# Patient Record
Sex: Female | Born: 1935 | Race: White | Hispanic: No | Marital: Married | Smoking: Never smoker
Health system: Southern US, Community
[De-identification: ages and names within clinical notes are randomized; demographics above are authoritative.]

## PROBLEM LIST (undated history)

## (undated) DIAGNOSIS — H3530 Unspecified macular degeneration: Secondary | ICD-10-CM

## (undated) DIAGNOSIS — C73 Malignant neoplasm of thyroid gland: Secondary | ICD-10-CM

## (undated) DIAGNOSIS — I447 Left bundle-branch block, unspecified: Secondary | ICD-10-CM

## (undated) DIAGNOSIS — A0472 Enterocolitis due to Clostridium difficile, not specified as recurrent: Secondary | ICD-10-CM

## (undated) DIAGNOSIS — I10 Essential (primary) hypertension: Secondary | ICD-10-CM

## (undated) DIAGNOSIS — I429 Cardiomyopathy, unspecified: Secondary | ICD-10-CM

## (undated) DIAGNOSIS — K922 Gastrointestinal hemorrhage, unspecified: Secondary | ICD-10-CM

## (undated) DIAGNOSIS — I4891 Unspecified atrial fibrillation: Secondary | ICD-10-CM

## (undated) DIAGNOSIS — K5792 Diverticulitis of intestine, part unspecified, without perforation or abscess without bleeding: Secondary | ICD-10-CM

## (undated) DIAGNOSIS — E039 Hypothyroidism, unspecified: Secondary | ICD-10-CM

## (undated) DIAGNOSIS — IMO0002 Reserved for concepts with insufficient information to code with codable children: Secondary | ICD-10-CM

## (undated) DIAGNOSIS — B029 Zoster without complications: Secondary | ICD-10-CM

## (undated) DIAGNOSIS — M1990 Unspecified osteoarthritis, unspecified site: Secondary | ICD-10-CM

## (undated) DIAGNOSIS — M329 Systemic lupus erythematosus, unspecified: Secondary | ICD-10-CM

## (undated) HISTORY — DX: Reserved for concepts with insufficient information to code with codable children: IMO0002

## (undated) HISTORY — PX: THYROIDECTOMY: SHX17

## (undated) HISTORY — DX: Systemic lupus erythematosus, unspecified: M32.9

## (undated) HISTORY — DX: Zoster without complications: B02.9

## (undated) HISTORY — PX: CYST REMOVAL NECK: SHX6281

---

## 2000-09-20 ENCOUNTER — Ambulatory Visit (HOSPITAL_COMMUNITY): Admission: RE | Admit: 2000-09-20 | Discharge: 2000-09-20 | Payer: Self-pay | Admitting: Internal Medicine

## 2000-09-20 ENCOUNTER — Encounter: Payer: Self-pay | Admitting: Internal Medicine

## 2000-09-26 ENCOUNTER — Ambulatory Visit (HOSPITAL_COMMUNITY): Admission: RE | Admit: 2000-09-26 | Discharge: 2000-09-26 | Payer: Self-pay | Admitting: Internal Medicine

## 2001-03-11 ENCOUNTER — Ambulatory Visit (HOSPITAL_COMMUNITY): Admission: RE | Admit: 2001-03-11 | Discharge: 2001-03-11 | Payer: Self-pay | Admitting: Internal Medicine

## 2001-03-11 ENCOUNTER — Encounter: Payer: Self-pay | Admitting: Internal Medicine

## 2002-06-18 ENCOUNTER — Encounter: Payer: Self-pay | Admitting: Internal Medicine

## 2002-06-18 ENCOUNTER — Ambulatory Visit (HOSPITAL_COMMUNITY): Admission: RE | Admit: 2002-06-18 | Discharge: 2002-06-18 | Payer: Self-pay | Admitting: Internal Medicine

## 2003-10-01 ENCOUNTER — Ambulatory Visit (HOSPITAL_COMMUNITY): Admission: RE | Admit: 2003-10-01 | Discharge: 2003-10-01 | Payer: Self-pay | Admitting: Family Medicine

## 2004-02-29 ENCOUNTER — Ambulatory Visit (HOSPITAL_COMMUNITY): Admission: RE | Admit: 2004-02-29 | Discharge: 2004-02-29 | Payer: Self-pay | Admitting: Otolaryngology

## 2004-08-04 ENCOUNTER — Encounter (INDEPENDENT_AMBULATORY_CARE_PROVIDER_SITE_OTHER): Payer: Self-pay | Admitting: *Deleted

## 2004-08-04 ENCOUNTER — Ambulatory Visit (HOSPITAL_COMMUNITY): Admission: RE | Admit: 2004-08-04 | Discharge: 2004-08-05 | Payer: Self-pay | Admitting: Otolaryngology

## 2005-06-08 ENCOUNTER — Ambulatory Visit (HOSPITAL_COMMUNITY): Admission: RE | Admit: 2005-06-08 | Discharge: 2005-06-08 | Payer: Self-pay | Admitting: Family Medicine

## 2006-08-15 ENCOUNTER — Ambulatory Visit (HOSPITAL_COMMUNITY): Admission: RE | Admit: 2006-08-15 | Discharge: 2006-08-15 | Payer: Self-pay | Admitting: Family Medicine

## 2007-01-10 ENCOUNTER — Ambulatory Visit (HOSPITAL_COMMUNITY): Admission: RE | Admit: 2007-01-10 | Discharge: 2007-01-10 | Payer: Self-pay | Admitting: Family Medicine

## 2007-08-22 ENCOUNTER — Ambulatory Visit (HOSPITAL_COMMUNITY): Admission: RE | Admit: 2007-08-22 | Discharge: 2007-08-22 | Payer: Self-pay | Admitting: Family Medicine

## 2007-11-25 ENCOUNTER — Ambulatory Visit (HOSPITAL_COMMUNITY): Admission: RE | Admit: 2007-11-25 | Discharge: 2007-11-25 | Payer: Self-pay | Admitting: Ophthalmology

## 2008-08-31 ENCOUNTER — Ambulatory Visit (HOSPITAL_COMMUNITY): Admission: RE | Admit: 2008-08-31 | Discharge: 2008-08-31 | Payer: Self-pay | Admitting: Family Medicine

## 2008-09-17 ENCOUNTER — Ambulatory Visit (HOSPITAL_COMMUNITY): Admission: RE | Admit: 2008-09-17 | Discharge: 2008-09-17 | Payer: Self-pay | Admitting: Family Medicine

## 2009-09-27 ENCOUNTER — Ambulatory Visit (HOSPITAL_COMMUNITY): Admission: RE | Admit: 2009-09-27 | Discharge: 2009-09-27 | Payer: Self-pay | Admitting: Family Medicine

## 2009-11-11 ENCOUNTER — Ambulatory Visit (HOSPITAL_COMMUNITY)
Admission: RE | Admit: 2009-11-11 | Discharge: 2009-11-11 | Payer: Self-pay | Source: Home / Self Care | Admitting: Family Medicine

## 2010-05-28 ENCOUNTER — Encounter: Payer: Self-pay | Admitting: Otolaryngology

## 2010-09-16 ENCOUNTER — Other Ambulatory Visit (HOSPITAL_COMMUNITY): Payer: Self-pay | Admitting: Family Medicine

## 2010-09-16 DIAGNOSIS — Z139 Encounter for screening, unspecified: Secondary | ICD-10-CM

## 2010-09-23 NOTE — Op Note (Signed)
NAMEBERENISE, HUNTON NO.:  [id number]   MEDICAL RECORD NO.:  [id number]          PATIENT TYPE:  OIB   LOCATION:  2550                         FACILITY:  [facility]   PHYSICIAN:  Zola Button T. Lazarus Salines, M.D. DATE OF BIRTH:  [birth year]   DATE OF PROCEDURE:  08/04/2004  DATE OF DISCHARGE:                                 OPERATIVE REPORT   PREOPERATIVE DIAGNOSIS:  Left retropharyngeal cyst.   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   PROCEDURE PERFORMED:  Excision, left retropharyngeal cyst.   SURGEON:  Karol T. Lazarus Salines, M.D.   ASSISTANT:  Tia Alert, M.D.   ANESTHESIA:  General orotracheal.   BLOOD LOSS:  Minimal.   COMPLICATIONS:  None.   FINDINGS:  A roughly 2 x 1 cm fibrotically-encapsulated cyst lying between  the muscle fibers of the longus colli muscle and down to the C1-2 joint  space with a thick, gelatinous, clear fluid content.   PROCEDURE:  With the patient in a comfortable supine position, general  orotracheal anesthesia was induced without difficulty.  At an appropriate  level, the table was turned 90 degrees and the patient placed in  Trendelenburg.  A clean preparation and draping was accomplished.  Taking  care to protect lips, teeth, and endotracheal tube, the Crowe-Davis mouth  gag was introduced, expanded for visualization, and suspended from the [facility]  stand in the standard fashion.  The findings were as described above.  A red  rubber catheter was passed through the left nostril and out the mouth to  serve as a Producer, television/film/video.  The lesion was palpated with the findings as  described above.  Xylocaine 1% with 1:100,000 epinephrine, 8 mL total, was  infiltrated in the mucosa overlying the lesion and circumferentially around  the lesion in the surgical field.  Several minutes were allowed for this to  take effect.   The lesion was palpated once again and although the mucosa was freely  movable over the  lesion, the lesion itself did not feel like it moved very  much.  The CT scans and MRI scans were reviewed.   A 3-4 cm vertical incision was made overlying the mass and carried through  the mucosa and the pharyngeal constrictors, and then with blunt division,  through the fibers of the longus colli muscle.  The muscle was retracted to  either side of the lesion, which was dissected around its capsule.  Upon  approaching the vertebral column, the dissection was carried right down to  the bone to achieve an adequate plane.  Several tiny violations oozed small  quantities of a crystal clear gelatinous fluid.  At this point, given some  suspicion of a spine-related process, assistance was requested and Dr. Marikay Alar, neurosurgeon, came to observe and assist.  He observed the x-rays and  the operative findings.  He was not familiar with a synovial cyst in this  location but agreed that it had the characteristics.  He also felt that  excision was the proper course, given the patient's symptoms, and that  entering the joint space was not  necessarily an issue either as regards loss  of the bone support, joint lubrication, or infection.   With Dr. Yetta Barre' assistance, the lesion was further dissected from the longus  colli muscle and then from the bone in the face of the vertebral bodies and  delivered.  It was sent for frozen section.  A clear view into a joint space  which was felt to be C1-2 was noted.  There was no further fluid and no  signs of infection.  No obvious pathology in the joint space.  The wound was  thoroughly irrigated with approximately 500 mL of saline.   The wound was closed in two layers.  First, the longus coli muscle was  reapproximated with 4-0 chromic and then mucosa was reapproximated with the  same material.  The frozen section returned from pathology as a benign cyst  with an attenuated lining.  When I explained the circumstances, the  pathologist did feel this was  consistent with a synovial cyst. Permanent  interpretation is pending.   The mouth gag and palate retractors were relaxed for several minutes.  Upon  re-expansion, hemostasis was persistent and the wound was flat and intact.  At this point, the procedure was completed.  The mouth gag was relaxed and  removed, as were the palate retractors.  The dental status was intact.  The  patient was returned to Anesthesia, awakened, extubated, and transferred to  recovery in stable condition.   COMMENT:  A 77 year old white female with a roughly six-month history of a  symptomatic bulge in the left lateral oropharynx/retropharyngeal lesion with  CT and MRI scan suggesting a retropharyngeal cyst was the indication for  today's procedure.  Intraoperative findings suggested a synovial cyst.  Anticipate a routine postoperative recovery with attention to analgesia and  antibiosis.  We will advance diet as comfortable.  Will observe overnight  and then discharge her home in the morning.      KTW/MEDQ  D:  08/04/2004  T:  08/04/2004  Job:  [id number]   cc:   Melvyn Novas, MD  [address]  [place]  [place] [address]  Fax: [phone]   Tia Alert, MD  [address]  [place], [place] [address]  Fax: [phone]

## 2010-10-04 ENCOUNTER — Ambulatory Visit (HOSPITAL_COMMUNITY)
Admission: RE | Admit: 2010-10-04 | Discharge: 2010-10-04 | Disposition: A | Discharge status: Home / Self Care | Payer: Medicare Other | Source: Ambulatory Visit | Attending: Family Medicine | Admitting: Family Medicine

## 2010-10-04 DIAGNOSIS — Z1231 Encounter for screening mammogram for malignant neoplasm of breast: Secondary | ICD-10-CM | POA: Insufficient documentation

## 2010-10-04 DIAGNOSIS — Z139 Encounter for screening, unspecified: Secondary | ICD-10-CM

## 2011-02-02 LAB — BASIC METABOLIC PANEL
BUN: 13
Calcium: 9
Chloride: 105
Glucose, Bld: 88
Potassium: 4.1
Sodium: 140

## 2011-03-16 ENCOUNTER — Encounter: Payer: Self-pay | Admitting: Emergency Medicine

## 2011-03-16 ENCOUNTER — Inpatient Hospital Stay (HOSPITAL_COMMUNITY)
Admission: EM | Admit: 2011-03-16 | Discharge: 2011-03-18 | DRG: 379 | Disposition: A | Discharge status: Home / Self Care | Payer: Medicare Other | Attending: Family Medicine | Admitting: Family Medicine

## 2011-03-16 ENCOUNTER — Other Ambulatory Visit: Payer: Self-pay

## 2011-03-16 DIAGNOSIS — I10 Essential (primary) hypertension: Secondary | ICD-10-CM | POA: Diagnosis present

## 2011-03-16 DIAGNOSIS — M1990 Unspecified osteoarthritis, unspecified site: Secondary | ICD-10-CM | POA: Diagnosis present

## 2011-03-16 DIAGNOSIS — Z791 Long term (current) use of non-steroidal anti-inflammatories (NSAID): Secondary | ICD-10-CM

## 2011-03-16 DIAGNOSIS — K5733 Diverticulitis of large intestine without perforation or abscess with bleeding: Principal | ICD-10-CM | POA: Diagnosis present

## 2011-03-16 DIAGNOSIS — K922 Gastrointestinal hemorrhage, unspecified: Secondary | ICD-10-CM

## 2011-03-16 DIAGNOSIS — F411 Generalized anxiety disorder: Secondary | ICD-10-CM | POA: Diagnosis present

## 2011-03-16 DIAGNOSIS — E039 Hypothyroidism, unspecified: Secondary | ICD-10-CM | POA: Diagnosis present

## 2011-03-16 HISTORY — DX: Malignant neoplasm of thyroid gland: C73

## 2011-03-16 HISTORY — DX: Unspecified macular degeneration: H35.30

## 2011-03-16 HISTORY — DX: Unspecified osteoarthritis, unspecified site: M19.90

## 2011-03-16 LAB — MAGNESIUM: Magnesium: 2 mg/dL (ref 1.5–2.5)

## 2011-03-16 LAB — DIFFERENTIAL
Basophils Absolute: 0 10*3/uL (ref 0.0–0.1)
Basophils Relative: 0 % (ref 0–1)
Lymphs Abs: 1.7 10*3/uL (ref 0.7–4.0)
Neutro Abs: 8 10*3/uL — ABNORMAL HIGH (ref 1.7–7.7)

## 2011-03-16 LAB — COMPREHENSIVE METABOLIC PANEL
Albumin: 3.3 g/dL — ABNORMAL LOW (ref 3.5–5.2)
Alkaline Phosphatase: 113 U/L (ref 39–117)
CO2: 28 mEq/L (ref 19–32)
Calcium: 8.4 mg/dL (ref 8.4–10.5)
Creatinine, Ser: 0.85 mg/dL (ref 0.50–1.10)
GFR calc non Af Amer: 64 mL/min — ABNORMAL LOW (ref >90)
Potassium: 3.6 mEq/L (ref 3.5–5.1)
Total Bilirubin: 0.5 mg/dL (ref 0.3–1.2)
Total Protein: 6.6 g/dL (ref 6.0–8.3)

## 2011-03-16 LAB — HEMOGLOBIN AND HEMATOCRIT, BLOOD
HCT: 36.2 % (ref 36.0–46.0)
Hemoglobin: 12.2 g/dL (ref 12.0–15.0)

## 2011-03-16 LAB — CBC
MCH: 29.1 pg (ref 26.0–34.0)
MCHC: 32.8 g/dL (ref 30.0–36.0)
MCV: 88.8 fL (ref 78.0–100.0)
Platelets: 318 10*3/uL (ref 150–400)
RBC: 4.54 MIL/uL (ref 3.87–5.11)
RDW: 14.1 % (ref 11.5–15.5)

## 2011-03-16 LAB — PROTIME-INR: Prothrombin Time: 13.1 seconds (ref 11.6–15.2)

## 2011-03-16 MED ORDER — PANTOPRAZOLE SODIUM 40 MG IV SOLR
40.0000 mg | INTRAVENOUS | Status: DC
  Administered 2011-03-16: 40 mg via INTRAVENOUS
  Filled 2011-03-16: qty 40

## 2011-03-16 MED ORDER — PEG 3350-KCL-NABCB-NACL-NASULF 236 G PO SOLR
4000.0000 mL | Freq: Once | ORAL | Status: AC
  Administered 2011-03-17: 4000 mL via ORAL
  Filled 2011-03-16: qty 4000

## 2011-03-16 MED ORDER — LEVOTHYROXINE SODIUM 100 MCG PO TABS
100.0000 ug | ORAL_TABLET | Freq: Every day | ORAL | Status: DC
  Administered 2011-03-17 – 2011-03-18 (×2): 100 ug via ORAL
  Filled 2011-03-16 (×2): qty 1

## 2011-03-16 MED ORDER — AMLODIPINE BESYLATE 5 MG PO TABS
5.0000 mg | ORAL_TABLET | Freq: Every day | ORAL | Status: DC
  Administered 2011-03-17 – 2011-03-18 (×2): 5 mg via ORAL
  Filled 2011-03-16 (×2): qty 1

## 2011-03-16 MED ORDER — SODIUM CHLORIDE 0.9 % IV SOLN
INTRAVENOUS | Status: DC
  Administered 2011-03-16: 13:00:00 via INTRAVENOUS

## 2011-03-16 MED ORDER — PANTOPRAZOLE SODIUM 40 MG IV SOLR: 40.0000 mg | INTRAVENOUS | Status: DC

## 2011-03-16 MED ORDER — SODIUM CHLORIDE 0.9 % IJ SOLN
INTRAMUSCULAR | Status: AC
  Filled 2011-03-16: qty 3

## 2011-03-16 MED ORDER — SODIUM CHLORIDE 0.9 % IV SOLN
Freq: Once | INTRAVENOUS | Status: AC
  Administered 2011-03-16: 09:00:00 via INTRAVENOUS

## 2011-03-16 MED ORDER — LORAZEPAM 0.5 MG PO TABS
0.5000 mg | ORAL_TABLET | Freq: Four times a day (QID) | ORAL | Status: DC | PRN
  Administered 2011-03-18: 0.5 mg via ORAL
  Filled 2011-03-16: qty 1

## 2011-03-16 MED ORDER — SODIUM CHLORIDE 0.9 % IV BOLUS (SEPSIS)
500.0000 mL | Freq: Once | INTRAVENOUS | Status: AC
  Administered 2011-03-16: 500 mL via INTRAVENOUS

## 2011-03-16 MED ORDER — HYDROCODONE-ACETAMINOPHEN 5-325 MG PO TABS
1.0000 | ORAL_TABLET | ORAL | Status: DC | PRN
  Administered 2011-03-16: 1 via ORAL
  Filled 2011-03-16: qty 1

## 2011-03-16 MED ORDER — INFLUENZA VIRUS VACC SPLIT PF IM SUSP
0.5000 mL | INTRAMUSCULAR | Status: AC
  Administered 2011-03-18: 0.5 mL via INTRAMUSCULAR
  Filled 2011-03-16: qty 0.5

## 2011-03-16 MED ORDER — SODIUM CHLORIDE 0.9 % IV SOLN
INTRAVENOUS | Status: DC
  Administered 2011-03-17 – 2011-03-18 (×2): via INTRAVENOUS

## 2011-03-16 NOTE — H&P (Signed)
NAME:  Danielle [name], Danielle [name] NO.:  [id number]  MEDICAL RECORD NO.:  [id number]  LOCATION:  A321                          FACILITY:  [facility]  PHYSICIAN:  Melvyn Novas, MDDATE OF BIRTH:  [birth year]  DATE OF ADMISSION:  03/16/2011 DATE OF DISCHARGE:  LH                             HISTORY & PHYSICAL   The patient is a 77 year old white female with a history of hypothyroidism and hypertension who had bowel movement today and noticed some bright red blood per rectum.  She denied any rectal pain upon defecation.  There was no dizziness.  No palpitations.  No syncope.  No anginal chest pain surrounding this event.  She reported to the ER hemoglobin was 13 and was subsequently admitted and presumably will be scoped for presumed diverticular bleed.  The patient had not opted to have a colonoscopy recommended to her in the past several years.  PAST MEDICAL HISTORY:  Significant for hypertension, hypothyroidism due to resection of thyroid nodules, history of palpitations and anxiety.  PAST SURGICAL HISTORY:  Remarkable for a thyroidectomy, 2 nodules in 1976.  She had a negative upper GI series.  PAST MEDICAL HISTORY:  Uterine fibroid was noted on her upper GI series.  She has no known allergies.  CURRENT MEDICINES: 1. Synthroid 100 mcg p.o. 5 days a week. 2. Metoprolol 12.5 mg p.o. daily. 3. Aspirin 81 mg per day. 4. Lodine 200 mg p.o. daily p.r.n. 5. Norvasc 5 mg p.o. daily. 6. Flaxseed oil 1 p.o. daily. 7. Nasonex nasal spray for allergic rhinitis.  She lives with her husband, does not smoke, is active and performs all house work.  She does not imbibe alcohol.  REVIEW OF SYSTEMS:  Negative for seizures, tremors, polyuria, polydipsia, weight loss, nausea vomiting, melena, hematemesis, angina, palpitations, dizziness, or syncope.  PHYSICAL EXAMINATION:  VITAL SIGNS:  Blood pressure is 117/75, temperature 98.3, pulse is 61 and regular, respiratory rate is 18,  O2 sat is 98%. HEAD:  Normocephalic, atraumatic. EYES:  PERRLA.  Extraocular movements intact.  Sclerae clear. Conjunctivae pink. LUNGS:  No rales, wheeze, or rhonchi appreciable.  No dullness to percussion. HEART:  Regular rhythm.  No murmurs, gallops, heaves, thrills, or rubs. ABDOMEN:  Soft, nontender.  Bowel sounds are normoactive.  No guarding or rebound.  No mass or megaly. EXTREMITIES:  No clubbing, cyanosis, or edema.  Peripheral pulses 1+ and intact bilaterally, and the patient alert and oriented.  Cranial nerves II through XII grossly intact.  The patient moves all 4 extremities. Plantars are downgoing.  White cells are 10.9, hemoglobin 13, platelets are 318,000.  Renal function normal.  Impression is as follows: 1. Presumed lower gastrointestinal bleed, presumed diverticular. 2. Hypertension. 3. Anxiety. 4. Hypothyroidism, currently on replacement therapy due to two thyroid     nodule resections. 5. A negative upper gastrointestinal series within the last 3 years.  The plan right now is to get serial hemoglobin and hematocrits, clear liquid diet, GoLYTELY.  Dr. Karilyn Cota will perform endoscopies in a.m.  She is hemodynamically stable.     Melvyn Novas, MD     RMD/MEDQ  D:  03/16/2011  T:  03/16/2011  Job:  [id number]

## 2011-03-16 NOTE — ED Notes (Signed)
T Setzer PA gastro in to assess pt. No changes

## 2011-03-16 NOTE — ED Notes (Signed)
PT ambulated to the bathroom.  Stool sample obtained.(bloody witah clots).  Nurse informed.  PT c/o headache once back in bed.  Tech in now obtaining orthostatics.

## 2011-03-16 NOTE — ED Notes (Signed)
Raynelle Bring, PA here for Dr. Karilyn Cota (GI) to consult.

## 2011-03-16 NOTE — Consult Note (Signed)
Reason for Consult: GI bleed Referring Physician: Wilber [name] is an 77 y.o. female.  HPI: Danielle [name] is a 77 yr old female admitted with probably lower GI bleed. She tells me she began having diarrhea with maroon color blood. She describes as a significant amount.  She has also had 4 loose bloody stools today. She denies prior history of rectal bleeding. She has never undergone a colonoscopy in the past. She has been on Lodine one a day.  She stopped Celebrex for her arthritis about two weeks ago. She does take an 81mg  ASA daily.  Her appetite has remained good. No weight loss. Slight epigastric discomfort. No abdominal pain.  She usually has one BM a day.  No family hx of colon cancer. No recent antibiotics.   Past Medical History  Diagnosis Date  . Osteoarthritis   . Macular degeneration   . Hypertension   . Thyroid disease   . Thyroid cancer     Past Surgical History  Procedure Date  . Thyroidectomy     History reviewed. No pertinent family history.  Social History:  reports that she has never smoked. She does not have any smokeless tobacco history on file. She reports that she does not drink alcohol or use illicit drugs.  Allergies: No known allergies  Medications: I have reviewed the patient's current medications. Current Facility-Administered Medications  Medication Dose Route Frequency Provider Last Rate Last Dose  . 0.9 %  sodium chloride infusion   Intravenous Once [facility]. Colon Branch, MD 75 mL/hr at 03/16/11 0847    . sodium chloride 0.9 % bolus 500 mL  500 mL Intravenous Once [employer]. Colon Branch, MD   500 mL at 03/16/11 0900   No current outpatient prescriptions on file.   Current facility-administered medications:0.9 %  sodium chloride infusion, , Intravenous, Once, [facility]. Colon Branch, MD, Last Rate: 75 mL/hr at 03/16/11 0847;  sodium chloride 0.9 % bolus 500 mL, 500 mL, Intravenous, Once, [facility]. Colon Branch, MD, 500 mL at 03/16/11 0900 No current outpatient  prescriptions on file.   Results for orders placed during the hospital encounter of 03/16/11 (from the past 48 hour(s))  CBC     Status: Abnormal   Collection Time   03/16/11  8:12 AM      Component Value Range Comment   WBC 10.9 (*) 4.0 - 10.5 (K/uL)    RBC 4.54  3.87 - 5.11 (MIL/uL)    Hemoglobin 13.2  12.0 - 15.0 (g/dL)    HCT 91.4  78.2 - 95.6 (%)    MCV 88.8  78.0 - 100.0 (fL)    MCH 29.1  26.0 - 34.0 (pg)    MCHC 32.8  30.0 - 36.0 (g/dL)    RDW 21.3  08.6 - 57.8 (%)    Platelets 318  150 - 400 (K/uL)   DIFFERENTIAL     Status: Abnormal   Collection Time   03/16/11  8:12 AM      Component Value Range Comment   Neutrophils Relative 73  43 - 77 (%)    Neutro Abs 8.0 (*) 1.7 - 7.7 (K/uL)    Lymphocytes Relative 15  12 - 46 (%)    Lymphs Abs 1.7  0.7 - 4.0 (K/uL)    Monocytes Relative 9  3 - 12 (%)    Monocytes Absolute 1.0  0.1 - 1.0 (K/uL)    Eosinophils Relative 2  0 - 5 (%)    Eosinophils Absolute 0.2  0.0 -  0.7 (K/uL)    Basophils Relative 0  0 - 1 (%)    Basophils Absolute 0.0  0.0 - 0.1 (K/uL)   COMPREHENSIVE METABOLIC PANEL     Status: Abnormal   Collection Time   03/16/11  8:12 AM      Component Value Range Comment   Sodium 141  135 - 145 (mEq/L)    Potassium 3.6  3.5 - 5.1 (mEq/L)    Chloride 106  96 - 112 (mEq/L)    CO2 28  19 - 32 (mEq/L)    Glucose, Bld 104 (*) 70 - 99 (mg/dL)    BUN 21  6 - 23 (mg/dL)    Creatinine, Ser 0.45  0.50 - 1.10 (mg/dL)    Calcium 8.4  8.4 - 10.5 (mg/dL)    Total Protein 6.6  6.0 - 8.3 (g/dL)    Albumin 3.3 (*) 3.5 - 5.2 (g/dL)    AST 11  0 - 37 (U/L)    ALT 11  0 - 35 (U/L)    Alkaline Phosphatase 113  39 - 117 (U/L)    Total Bilirubin 0.5  0.3 - 1.2 (mg/dL)    GFR calc non Af Amer 64 (*) >90 (mL/min)    GFR calc Af Amer 74 (*) >90 (mL/min)   PROTIME-INR     Status: Normal   Collection Time   03/16/11  8:12 AM      Component Value Range Comment   Prothrombin Time 13.1  11.6 - 15.2 (seconds)    INR 0.97  0.00 - 1.49    APTT      Status: Normal   Collection Time   03/16/11  8:12 AM      Component Value Range Comment   aPTT 27  24 - 37 (seconds)     No results found.  Review of Systems  Constitutional: Negative for fever, chills and weight loss.  Gastrointestinal: Positive for abdominal pain.  Slight epigastric discomfort. Blood pressure 100/50, pulse 61, temperature 98.3 F (36.8 C), temperature source Oral, height 5\' 7"  (1.702 m), weight 168 lb (76.204 kg), SpO2 97.00%. Physical ExamAlert and oriented. Skin warm and dry. Oral mucosa is moist. Natural teeth in good condition. Sclera anicteric, conjunctivae is pink. Thyroid not enlarged. No cervical lymphadenopathy. Lungs clear. Heart regular rate and rhythm.  Abdomen is soft. Bowel sounds are positive. No hepatomegaly. No abdominal masses felt. No tenderness.  No edema to lower extremities. Patient is alert and oriented. She ihas already been examined and is guaiac positive.  Assessment/Plan: Probably lower GI bleed. Colonic neoplasm, AVM, painless bleed from diverticulosis, polyp needs to be ruled out. Will schedule a colonoscopy with Dr. Karilyn Cota. If colonoscopy is normal, he will proceed with an EGD.  Dr. Karilyn Cota will be in later to evaluate patient. I discussed this case with Dr. Karilyn Cota.  Danielle [name] W 03/16/2011, 9:21 AM     500 Hz 1000 Hz 2000 Hz 4000 Hz  Right      Left

## 2011-03-16 NOTE — Consult Note (Signed)
Patient interviewed and examined. Details noted and Ms. Setzer's note. Patient presents with acute onset of bright blood per rectum. Now she is passing some clots he has noted some fullness and vague abdominal discomfort. No history of weight loss chronic heartburn or epigastric pain. She has been using NSAIDs chronically; currently on Lodine 200 mg daily when necessary. She also takes low-dose aspirin along with amlodipine, levothyroxine, Norco and metoprolol. She has declined to undergo colonoscopy in the past and now is agreeable. There is no history of peptic ulcer disease. Her BUN on admission is normal. H&H is 13.2 and 40.3. Abdominal exam reveals normal bowel movements soft nontender abdomen without masses or organomegaly. Assessment Acute GI bleed most likely due to colonic diverticulosis. She could also have NSAID-induced colopathy. Toward possibility of upper GI bleed with rapid transit. We will monitor her H&H. Colonoscopy to be performed in a.m.

## 2011-03-16 NOTE — ED Provider Notes (Signed)
History   This chart was scribed for [employer]. Colon Branch, MD by Clarita Crane. The patient was seen in room APA04/[facility]04 and the patient's care was started at 7:40AM.   CSN: [id number] Arrival date & time: 03/16/2011  7:33 AM   First MD Initiated Contact with Patient 03/16/11 0737      Chief Complaint  Patient presents with  . Rectal Bleeding    (Consider location/radiation/quality/duration/timing/severity/associated sxs/prior treatment) HPI Danielle [name] is a 77 y.o. female who presents to the Emergency Department complaining of episodic rectal bleeding onset yesterday and persistent since with associated abdominal discomfort and diarrhea. Patient reports having 1 episode of diarrhea with bright red blood in stool yesterday morning after which she took Imodium and had an additional episode of diarrhea with bright red blood in stool yesterday afternoon. Patient then reports awaking this morning and having a moderate to severe episode of bright red blood from rectum with no BM. Patient reports she is currently on Lodine as prescribed by Dr. Hilda Lias for arthritis. Denies abdominal pain, fever, chills, swelling of lower extremities, recent new exposures/medications.  PCP-[place]  Past Medical History  Diagnosis Date  . Osteoarthritis   . Macular degeneration   . Hypertension   . Thyroid disease   . Thyroid cancer     Past Surgical History  Procedure Date  . Thyroidectomy     History reviewed. No pertinent family history.  History  Substance Use Topics  . Smoking status: Never Smoker   . Smokeless tobacco: Not on file  . Alcohol Use: No    OB History    Grav Para Term Preterm Abortions TAB SAB Ect Mult Living                  Review of Systems 10 Systems reviewed and are negative for acute change except as noted in the HPI.  Allergies  Review of patient's allergies indicates not on file.  Home Medications  No current outpatient prescriptions on file.  BP 100/50   Pulse 61  Temp(Src) 98.3 F (36.8 C) (Oral)  Ht 5\' 7"  (1.702 m)  Wt 168 lb (76.204 kg)  BMI 26.31 kg/m2  SpO2 97%  Physical Exam  Nursing note and vitals reviewed. Constitutional: She is oriented to person, place, and time. She appears well-developed and well-nourished. No distress.  HENT:  Head: Normocephalic and atraumatic.  Right Ear: Tympanic membrane normal.  Left Ear: Tympanic membrane normal.  Mouth/Throat: Oropharynx is clear and moist. No oropharyngeal exudate.  Eyes: EOM are normal.  Neck: Neck supple. No tracheal deviation present.  Cardiovascular: Normal rate and regular rhythm.  Exam reveals no gallop and no friction rub.   No murmur heard. Pulmonary/Chest: Effort normal and breath sounds normal. No respiratory distress. She has no wheezes. She has no rales.  Abdominal: Soft. Bowel sounds are normal. She exhibits no distension. There is no tenderness.  Genitourinary: Guaiac positive stool.  Musculoskeletal: Normal range of motion. She exhibits no edema.  Neurological: She is alert and oriented to person, place, and time. No sensory deficit.  Skin: Skin is warm and dry.  Psychiatric: She has a normal mood and affect. Her behavior is normal.    ED Course  Procedures (including critical care time)  DIAGNOSTIC STUDIES: Oxygen Saturation is 98% on room air, normal by my interpretation.    COORDINATION OF CARE: 7:50AM- Rectal exam performed. Guaiac Positive.  7:55AM- Patient informed of need to consult with GI and personally requests Dr. Karilyn Cota.  8:53AM- Consult  complete with Dr. Karilyn Cota. Patient case explained and discussed. Dr. Karilyn Cota recommends patient's PCP- [facility] admit patient and he will consult. 9:14AM- Consult complete with Dr. Janna Arch. Patient case explained and discussed. Dr. Janna Arch agrees to admit patient for further evaluation and treatment.    Labs Reviewed  CBC - Abnormal; Notable for the following:    WBC 10.9 (*)    All other components within  normal limits  DIFFERENTIAL - Abnormal; Notable for the following:    Neutro Abs 8.0 (*)    All other components within normal limits  COMPREHENSIVE METABOLIC PANEL - Abnormal; Notable for the following:    Glucose, Bld 104 (*)    Albumin 3.3 (*)    GFR calc non Af Amer 64 (*)    GFR calc Af Amer 74 (*)    All other components within normal limits  PROTIME-INR  APTT   No results found.   No diagnosis found.  Date: 03/16/2011 0836  Rate: 54  Rhythm: sinus bradycardia  QRS Axis: normal  Intervals: normal  ST/T Wave abnormalities: normal  Conduction Disutrbances:none  Narrative Interpretation:   Old EKG Reviewed: unchanged 11/19/07    MDM  Patient with no previous h/o GI bleeding presents with bloody stools since yesterday. She is on antiinflammatories regularly for osteoarthritis. Hgb stable, mildly orthostatic with stable blood pressure. Consulted with Dr. Karilyn Cota, GI. He will see patient in the hospital. Spoke with Dr. Crisoforo Oxford, PCP, who will admit patient.Pt stable in ED with no significant deterioration in condition.The patient appears reasonably stabilized for admission considering the current resources, flow, and capabilities available in the ED at this time, and I doubt any other [facility] requiring further screening and/or treatment in the ED prior to admission. MDM Reviewed: nursing note and vitals Interpretation: labs Total time providing critical care: 35. Consults: primary care provider and gastrointestinal    I personally performed the services described in this documentation, which was scribed in my presence. The recorded information has been reviewed and considered.    Nicoletta Dress. Colon Branch, MD 03/16/11 [phone]

## 2011-03-16 NOTE — ED Notes (Signed)
Dr. Janna Arch pagaed.

## 2011-03-16 NOTE — ED Notes (Signed)
Dr. Karilyn Cota in procedure in Endo, will call when available.  EDP informed.

## 2011-03-16 NOTE — ED Notes (Signed)
Pt states had diarrhea with bright red blood in stool yesterday morning.  States looked like just blood with no stool this am pta. Color wnl. "little" pain to lower abd area. States currently takes a lot of antiinflammatories. Denies n/v.

## 2011-03-16 NOTE — Plan of Care (Signed)
Problem: Phase I Progression Outcomes Goal: Other Phase I Outcomes/Goals Outcome: Completed/Met Date Met:  03/16/11 Pt has moderate anount of bright red bleeding with each void and BM.  No clots noted

## 2011-03-16 NOTE — H&P (Signed)
[id number] 

## 2011-03-17 ENCOUNTER — Encounter (HOSPITAL_COMMUNITY): Payer: Self-pay | Admitting: *Deleted

## 2011-03-17 ENCOUNTER — Encounter (HOSPITAL_COMMUNITY)
Admission: EM | Disposition: A | Discharge status: Home / Self Care | Payer: Self-pay | Source: Home / Self Care | Attending: Family Medicine

## 2011-03-17 DIAGNOSIS — K5732 Diverticulitis of large intestine without perforation or abscess without bleeding: Secondary | ICD-10-CM

## 2011-03-17 DIAGNOSIS — K5730 Diverticulosis of large intestine without perforation or abscess without bleeding: Secondary | ICD-10-CM

## 2011-03-17 HISTORY — PX: COLONOSCOPY: SHX5424

## 2011-03-17 LAB — HEMOGLOBIN AND HEMATOCRIT, BLOOD: HCT: 30.8 % — ABNORMAL LOW (ref 36.0–46.0)

## 2011-03-17 SURGERY — COLONOSCOPY
Anesthesia: Moderate Sedation

## 2011-03-17 MED ORDER — MIDAZOLAM HCL 5 MG/5ML IJ SOLN
INTRAMUSCULAR | Status: AC
  Filled 2011-03-17: qty 10

## 2011-03-17 MED ORDER — MEPERIDINE HCL 50 MG/ML IJ SOLN
INTRAMUSCULAR | Status: DC | PRN
  Administered 2011-03-17 (×2): 25 mg via INTRAVENOUS

## 2011-03-17 MED ORDER — MEPERIDINE HCL 50 MG/ML IJ SOLN
INTRAMUSCULAR | Status: AC
  Filled 2011-03-17: qty 1

## 2011-03-17 MED ORDER — MIDAZOLAM HCL 5 MG/5ML IJ SOLN
INTRAMUSCULAR | Status: DC | PRN
  Administered 2011-03-17: 2 mg via INTRAVENOUS
  Administered 2011-03-17: 1 mg via INTRAVENOUS
  Administered 2011-03-17: 2 mg via INTRAVENOUS

## 2011-03-17 MED ORDER — CIPROFLOXACIN IN D5W 400 MG/200ML IV SOLN
400.0000 mg | Freq: Two times a day (BID) | INTRAVENOUS | Status: DC
  Administered 2011-03-17 – 2011-03-18 (×2): 400 mg via INTRAVENOUS
  Filled 2011-03-17 (×6): qty 200

## 2011-03-17 MED ORDER — SIMETHICONE 40 MG/0.6ML PO SUSP
ORAL | Status: DC | PRN
  Administered 2011-03-17: 15:00:00

## 2011-03-17 MED ORDER — METRONIDAZOLE IN NACL 5-0.79 MG/ML-% IV SOLN
500.0000 mg | Freq: Three times a day (TID) | INTRAVENOUS | Status: DC
  Administered 2011-03-17 – 2011-03-18 (×3): 500 mg via INTRAVENOUS
  Filled 2011-03-17 (×9): qty 100

## 2011-03-17 NOTE — Op Note (Signed)
COLONOSCOPY PROCEDURE REPORT  PATIENT:  Danielle [name]  MR#:  [id number] Birthdate:  04-21-1931, 77 y.o., female Endoscopist:  Dr. Malissa Hippo, MD Referred By:  Dr. Ocie [facility] D. Procedure Date: 03/17/2011  Procedure:   Colonoscopy  Indications:  Acute lower GI bleed  Informed Consent: Procedure and risks were reviewed with the patient and informed consent was obtained   Medications:  Demerol 50 mg IV Versed 5 mg IV  Description of procedure:  After a digital rectal exam was performed, that colonoscope was advanced from the anus through the rectum and colon to the area of the cecum, ileocecal valve and appendiceal orifice. The cecum was deeply intubated. These structures were well-seen and photographed for the record. From the level of the cecum and ileocecal valve, the scope was slowly and cautiously withdrawn. The mucosal surfaces were carefully surveyed utilizing scope tip to flexion to facilitate fold flattening as needed. The scope was pulled down into the rectum where a thorough exam including retroflexion was performed.  Findings:   Prep excellent. Dilute blood noted and sigmoid colon without active bleeding. Bile noted in the ascending and transverse colon Multiple diverticula throughout the colon but predominantly in the sigmoid colon. Several of these were washed and no active bleeding noted. One diverticulum at distal sigmoid colon with edema and a small ulcer felt to be source of bleed but no active bleeding noted. Noise below the dentate line and single anal papilla.  Therapeutic/Diagnostic Maneuvers Performed:  None  Complications:  None  Cecal Withdrawal Time:  33 minutes  Impression:  Examination performed to cecum. Pan colonic diverticulosis; most of the diverticula are located in sigmoid and one with changes of diverticulitis felt to be source of GI bleed. No active bleeding noted therefore no therapy rendered.  Recommendations:  IV antibiotics for  24 hours and then po. Full liquid diet. H&H later today and in a.m.  Briena Swingler U  03/17/2011 3:48 PM  CC: Dr. Isabella Stalling, MD & Dr. Bonnetta Barry ref. provider found

## 2011-03-17 NOTE — Progress Notes (Signed)
NAME:  Danielle [name], Danielle [name] NO.:  [id number]  MEDICAL RECORD NO.:  [id number]  LOCATION:  [facility]                          FACILITY:  [facility]  PHYSICIAN:  Melvyn Novas, MDDATE OF BIRTH:  [birth year]  DATE OF PROCEDURE: DATE OF DISCHARGE:                                PROGRESS NOTE   The patient was admitted yesterday with bright red blood per rectum. Hemoglobin was 13.2 on admission, subsequently 12.2, this morning 10.6. The patient reportedly had no dizziness or dyspnea or chest pain.  Blood pressure today is 152/75, temperature 98.3, pulse is 72 and regular, respiratory rate is 18.  She has history of hypertension and hypothyroidism.  The patient is currently down having colonoscopy with Dr. Karilyn Cota.  EKG revealed no significant changes.  Physical examination not possible at present.  We will continue current monitoring of hemoglobin and hematocrit and proton pump inhibitor per Dr. Karilyn Cota in clinic.  We will advance diet as tolerated.     Melvyn Novas, MD     RMD/MEDQ  D:  03/17/2011  T:  03/17/2011  Job:  [id number]

## 2011-03-17 NOTE — Progress Notes (Signed)
[id number] 

## 2011-03-18 LAB — CBC
HCT: 32.9 % — ABNORMAL LOW (ref 36.0–46.0)
Hemoglobin: 10.7 g/dL — ABNORMAL LOW (ref 12.0–15.0)
MCHC: 32.5 g/dL (ref 30.0–36.0)
MCV: 88.9 fL (ref 78.0–100.0)

## 2011-03-18 LAB — BASIC METABOLIC PANEL
BUN: 5 mg/dL — ABNORMAL LOW (ref 6–23)
Chloride: 108 mEq/L (ref 96–112)
Creatinine, Ser: 0.73 mg/dL (ref 0.50–1.10)
GFR calc non Af Amer: 80 mL/min — ABNORMAL LOW (ref >90)
Glucose, Bld: 123 mg/dL — ABNORMAL HIGH (ref 70–99)
Potassium: 3.3 mEq/L — ABNORMAL LOW (ref 3.5–5.1)

## 2011-03-18 MED ORDER — PANTOPRAZOLE SODIUM 40 MG IV SOLR: 40.0000 mg | INTRAVENOUS | Status: DC

## 2011-03-18 MED ORDER — PANTOPRAZOLE SODIUM 40 MG PO TBEC: 40.0000 mg | DELAYED_RELEASE_TABLET | Freq: Every day | ORAL | Status: DC

## 2011-03-18 MED ORDER — METRONIDAZOLE 500 MG PO TABS: 500.0000 mg | ORAL_TABLET | Freq: Three times a day (TID) | ORAL | Status: AC

## 2011-03-18 MED ORDER — CIPROFLOXACIN HCL 500 MG PO TABS: 500.0000 mg | ORAL_TABLET | Freq: Two times a day (BID) | ORAL | Status: AC

## 2011-03-18 MED ORDER — POTASSIUM CHLORIDE ER 10 MEQ PO TBCR: 20.0000 meq | EXTENDED_RELEASE_TABLET | Freq: Two times a day (BID) | ORAL | Status: DC

## 2011-03-18 NOTE — Discharge Summary (Signed)
NAME:  Danielle [name], Danielle [name] NO.:  [id number]  MEDICAL RECORD NO.:  [id number]  LOCATION:  A321                          FACILITY:  [facility]  PHYSICIAN:  Melvyn Novas, MDDATE OF BIRTH:  [birth year]  DATE OF ADMISSION:  03/16/2011 DATE OF DISCHARGE:  LH                              DISCHARGE SUMMARY   HISTORY AND HOSPITAL COURSE:  The patient has a history of hypertension, hypothyroidism, DJD, and history of GERD who apparently had bright blood per rectum.  Had been taking Lodine 200 mg per day.  She was admitted. Hemoglobin was 13, drifted down to 11.  She was placed on a clear liquid diet and bowel prep was given.  Colonoscopy revealed several diverticular areas and possible areas of bleed.  It could also be a possibility of NSAID-induced colitis.  The patient was hemodynamically stable, resumed diet and did well.  She was cautioned not to take Lodine or any other NSAID, and she was given empiric coverage for diverticulitis in the form of 2 antibiotics, Cipro 500 p.o. b.i.d. as well as Flagyl 500 p.o. b.i.d. for a period of 10 days and she was also given Protonix 40 mg p.o. daily.  DISCHARGE MEDICATIONS: 1. Norvasc 5 mg p.o. daily. 2. Synthroid 100 mg p.o. daily. 3. Lopressor 50 mg p.o. daily.  She was cautioned not to take acetaminophen, Norco, guaifenesin, hydrocodone, or Lodine.  FOLLOWUP:  She will follow up in Dr. Otilio Saber office in a period of 1 week to check CBC and hemodynamics.  She was also given potassium chloride 20 mEq p.o. b.i.d. for 3 days upon discharge.     Melvyn Novas, MD     RMD/MEDQ  D:  03/18/2011  T:  03/18/2011  Job:  [id number]

## 2011-03-18 NOTE — Progress Notes (Signed)
Pt discharged home via family; Pt and family given and explained all discharge instructions, carenotes, and prescriptions; pt and family stated understanding and denied questions/concerns; all f/u appointments in place; IV removed without complicaitons; pt stable at time of discharge  

## 2011-03-18 NOTE — Discharge Summary (Signed)
[id number] 

## 2011-03-24 ENCOUNTER — Encounter (HOSPITAL_COMMUNITY): Payer: Self-pay | Admitting: Internal Medicine

## 2011-03-28 ENCOUNTER — Ambulatory Visit (INDEPENDENT_AMBULATORY_CARE_PROVIDER_SITE_OTHER): Payer: Medicare Other | Admitting: Internal Medicine

## 2011-03-28 ENCOUNTER — Encounter (INDEPENDENT_AMBULATORY_CARE_PROVIDER_SITE_OTHER): Payer: Self-pay | Admitting: Internal Medicine

## 2011-03-28 VITALS — BP 110/70 | HR 72 | Temp 98.1°F | Ht 68.0 in | Wt 169.4 lb

## 2011-03-28 DIAGNOSIS — M1990 Unspecified osteoarthritis, unspecified site: Secondary | ICD-10-CM | POA: Insufficient documentation

## 2011-03-28 DIAGNOSIS — K5732 Diverticulitis of large intestine without perforation or abscess without bleeding: Secondary | ICD-10-CM

## 2011-03-28 DIAGNOSIS — K5792 Diverticulitis of intestine, part unspecified, without perforation or abscess without bleeding: Secondary | ICD-10-CM

## 2011-03-28 DIAGNOSIS — I10 Essential (primary) hypertension: Secondary | ICD-10-CM

## 2011-03-28 DIAGNOSIS — E079 Disorder of thyroid, unspecified: Secondary | ICD-10-CM

## 2011-03-28 LAB — COMPREHENSIVE METABOLIC PANEL
ALT: 20 U/L (ref 0–35)
AST: 24 U/L (ref 0–37)
Albumin: 3.5 g/dL (ref 3.5–5.2)
Alkaline Phosphatase: 80 U/L (ref 39–117)
BUN: 13 mg/dL (ref 6–23)
CO2: 27 mEq/L (ref 19–32)
Calcium: 8.5 mg/dL (ref 8.4–10.5)
Chloride: 106 mEq/L (ref 96–112)
Creat: 0.99 mg/dL (ref 0.50–1.10)
Glucose, Bld: 106 mg/dL — ABNORMAL HIGH (ref 70–99)
Potassium: 3.8 mEq/L (ref 3.5–5.3)
Sodium: 142 mEq/L (ref 135–145)
Total Bilirubin: 0.4 mg/dL (ref 0.3–1.2)
Total Protein: 5.6 g/dL — ABNORMAL LOW (ref 6.0–8.3)

## 2011-03-28 LAB — CBC WITH DIFFERENTIAL/PLATELET
Basophils Absolute: 0 10*3/uL (ref 0.0–0.1)
Basophils Relative: 0 % (ref 0–1)
Eosinophils Absolute: 0.3 10*3/uL (ref 0.0–0.7)
Eosinophils Relative: 4 % (ref 0–5)
HCT: 35.2 % — ABNORMAL LOW (ref 36.0–46.0)
Hemoglobin: 11.3 g/dL — ABNORMAL LOW (ref 12.0–15.0)
Lymphocytes Relative: 16 % (ref 12–46)
Lymphs Abs: 1 10*3/uL (ref 0.7–4.0)
MCH: 29.1 pg (ref 26.0–34.0)
MCHC: 32.1 g/dL (ref 30.0–36.0)
MCV: 90.7 fL (ref 78.0–100.0)
Monocytes Absolute: 0.7 10*3/uL (ref 0.1–1.0)
Monocytes Relative: 11 % (ref 3–12)
Neutro Abs: 4.5 10*3/uL (ref 1.7–7.7)
Neutrophils Relative %: 69 % (ref 43–77)
Platelets: 351 10*3/uL (ref 150–400)
RBC: 3.88 MIL/uL (ref 3.87–5.11)
RDW: 15.1 % (ref 11.5–15.5)
WBC: 6.6 10*3/uL (ref 4.0–10.5)

## 2011-03-28 NOTE — Patient Instructions (Signed)
OV 6 months. CBC and Cmet today

## 2011-03-28 NOTE — Progress Notes (Signed)
Subjective:     Patient ID: Danielle [name], female   DOB: [birth year], 77 y.o.   MRN: [id number]    HPI  Danielle [name] is a 77 yr old female here today for a scheduled visit after recently admitted to [facility] for acute lower GI bleed (see Colonoscopy report below).  She began to have rectal bleeding the day before her admission. She described the bleeding as a large amount.  She denied any pain with the rectal bleeding. She had never undergone a colonoscopy.  While in the hospital she remained hemodynamically stable.  Her H and H 03/18/2011 10.2 and 30.8.   She denies ever having any abdominal pain with her rectal bleeding.. She does c/o nausea today from the antibiotics and is taking a very small amount of Phenergan for this.  She is on her last day of Cipro and Flagyl.   She does have epigastric tenderness.  She has had 3 loose stools today.  She says her stools have been dark but not black.    Colonoscopy 03/2011 for acute GI bleed. Impression:  Examination performed to cecum.  Pan colonic diverticulosis; most of the diverticula are located in sigmoid and one with changes of diverticulitis felt to be source of GI bleed. No active bleeding noted therefore no therapy rendered.  Review of Systems see hpi Current Outpatient Prescriptions  Medication Sig Dispense Refill  . amLODipine (NORVASC) 5 MG tablet Take 5 mg by mouth daily.        . ciprofloxacin (CIPRO) 500 MG tablet Take 1 tablet (500 mg total) by mouth 2 (two) times daily.  20 tablet  0  . levothyroxine (SYNTHROID, LEVOTHROID) 100 MCG tablet Take 100 mcg by mouth daily. Do not take synthroid on Mon and Thursdays      . metroNIDAZOLE (FLAGYL) 500 MG tablet Take 1 tablet (500 mg total) by mouth 3 (three) times daily.  30 tablet  0  . pantoprazole (PROTONIX) 40 MG injection Inject 40 mg into the vein daily.  1 each    . pantoprazole (PROTONIX) 40 MG tablet Take 1 tablet (40 mg total) by mouth daily.  30 tablet  1  . potassium chloride (K-DUR)  10 MEQ tablet Take 2 tablets (20 mEq total) by mouth 2 (two) times daily.  6 tablet  0  . metoprolol (LOPRESSOR) 50 MG tablet Take 25 mg by mouth daily.        History   Social History  . Marital Status: Married    Spouse Name: N/A    Number of Children: N/A  . Years of Education: N/A   Occupational History  . Not on file.   Social History Main Topics  . Smoking status: Never Smoker   . Smokeless tobacco: Not on file  . Alcohol Use: No  . Drug Use: No  . Sexually Active:    Other Topics Concern  . Not on file   Social History Narrative  . No narrative on file   No family history on file. History   Social History Narrative  . No narrative on file   Allergies  Allergen Reactions  . Thyroid Hormones     proparathyroid thyroid medication        Objective:   Physical Exam  Filed Vitals:   03/28/11 1037  BP: 110/70  Pulse: 72  Temp: 98.1 F (36.7 C)  Height: 5\' 8"  (1.727 m)  Weight: 169 lb 6.4 oz (76.839 kg)    Alert and oriented. Skin warm  and dry. Oral mucosa is moist. Natural teeth in good condition. Sclera anicteric, conjunctivae is pink. Thyroid not enlarged. No cervical lymphadenopathy. Lungs clear. Heart regular rate and rhythm.  Abdomen is soft. Bowel sounds are positive. No hepatomegaly. No abdominal masses felt. No tenderness.  No edema to lower extremities. Patient is alert and oriented.      Assessment:    Diverticulitis presently being treated. Hx of lower GI bleed, Diverticulitis on recent colonscopy    Plan:   CBC and C-met today.  Finish Flagyl and Cipro. OV in 6 months.

## 2011-04-25 ENCOUNTER — Telehealth (INDEPENDENT_AMBULATORY_CARE_PROVIDER_SITE_OTHER): Payer: Self-pay | Admitting: *Deleted

## 2011-04-25 NOTE — Telephone Encounter (Signed)
LM asking for Terri to please give her a return call. The phone number is [phone].

## 2011-04-27 NOTE — Telephone Encounter (Signed)
Discussed the Prednisone with her. She is going to take a dose pack for 5 dose for her arthritis.

## 2011-09-11 ENCOUNTER — Encounter (INDEPENDENT_AMBULATORY_CARE_PROVIDER_SITE_OTHER): Payer: Self-pay | Admitting: *Deleted

## 2011-09-25 ENCOUNTER — Ambulatory Visit (INDEPENDENT_AMBULATORY_CARE_PROVIDER_SITE_OTHER): Payer: Medicare Other | Admitting: Internal Medicine

## 2012-01-16 ENCOUNTER — Telehealth (INDEPENDENT_AMBULATORY_CARE_PROVIDER_SITE_OTHER): Payer: Self-pay | Admitting: *Deleted

## 2012-01-16 NOTE — Telephone Encounter (Signed)
Jessel would like to talk with Dr. Karilyn Cota. The return phone number is [phone].

## 2012-01-17 NOTE — Telephone Encounter (Signed)
Patient called and she says that last November she had a GI Bleed, under went Colonoscopy. The sourse of the bleeding could not be found. She has Chronic Arthritis all over and Dr.Keeling will no give her any anti Inflammatory medications because the source of the bleeding was not found. She wants to know what Dr.Rehman would recommend she can take for the Inflammation ? Patient may be reached at [phone]

## 2012-01-17 NOTE — Telephone Encounter (Signed)
She can take Advil 1-2 tablets with food twice daily. Risk of GI injury would be less than it would be with longer acting or more potent NSAIDs. Please let patient know.

## 2012-01-18 NOTE — Telephone Encounter (Signed)
Danielle [name] was called and given Dr.Rehman's recommendation. She does not feel that the Advil will help her. Per the patient she has severe Osteo-Arthritis from her neck to her feet. It is in advanced stages. She has seen a Dr.Bethea and it was all she could do to administer an injection into her back due to the thickness of her spine. Dr.Keeling has given her Vicodin 5/500 mg and she takes this every 5 hours and at times will take an additional 500 mg acetaminophen with it. 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  Danielle [name] was made aware that Dr.Rehman would be made aware of this, and I would call her back.

## 2012-01-19 NOTE — Telephone Encounter (Signed)
Per Dr.Rehman on 01/18/12 , the patient should see an Rheumatologist for evaluation of pain. Dr.Keeling may do the referral. Patient was called and made aware.

## 2013-02-20 ENCOUNTER — Other Ambulatory Visit (HOSPITAL_COMMUNITY): Payer: Self-pay | Admitting: Family Medicine

## 2013-02-20 DIAGNOSIS — Z139 Encounter for screening, unspecified: Secondary | ICD-10-CM

## 2013-03-04 ENCOUNTER — Ambulatory Visit (HOSPITAL_COMMUNITY)
Admission: RE | Admit: 2013-03-04 | Discharge: 2013-03-04 | Disposition: A | Discharge status: Home / Self Care | Payer: Medicare Other | Source: Ambulatory Visit | Attending: Family Medicine | Admitting: Family Medicine

## 2013-03-04 DIAGNOSIS — Z139 Encounter for screening, unspecified: Secondary | ICD-10-CM

## 2013-03-04 DIAGNOSIS — Z1231 Encounter for screening mammogram for malignant neoplasm of breast: Secondary | ICD-10-CM | POA: Insufficient documentation

## 2013-03-06 ENCOUNTER — Other Ambulatory Visit: Payer: Self-pay | Admitting: Family Medicine

## 2013-03-06 DIAGNOSIS — R928 Other abnormal and inconclusive findings on diagnostic imaging of breast: Secondary | ICD-10-CM

## 2013-03-10 ENCOUNTER — Telehealth (INDEPENDENT_AMBULATORY_CARE_PROVIDER_SITE_OTHER): Payer: Self-pay | Admitting: *Deleted

## 2013-03-10 NOTE — Telephone Encounter (Signed)
Danielle [name] said she was told by Dr. Karilyn Cota 2 years ago that she could not take anti-inflammatory drugs. Would like to know if Dr. Karilyn Cota meant never or for a while? She as 4 different types of arthritis and doesn't live taking prednisone all the time.  If she could take some on a as need bases and which ones. Her return phone number is [phone].

## 2013-03-12 NOTE — Telephone Encounter (Signed)
This was forwarded to Dr.Rehman to review and to advise recommendations for her.

## 2013-03-13 NOTE — Telephone Encounter (Signed)
She can use NSAIDS if she needs them; use low dose and be aware of side effects. Please let her know.

## 2013-03-14 NOTE — Telephone Encounter (Signed)
Patient's line is busy , will try again.

## 2013-03-14 NOTE — Telephone Encounter (Signed)
Patient was called and given Dr.Rehman's recommendation, she ask that I call and let Dr.Keeling know this. I called and spoke with his receptionist and read to her exactly what Dr.Rehman said.

## 2013-03-26 ENCOUNTER — Ambulatory Visit (HOSPITAL_COMMUNITY)
Admission: RE | Admit: 2013-03-26 | Discharge: 2013-03-26 | Disposition: A | Discharge status: Home / Self Care | Payer: Medicare Other | Source: Ambulatory Visit | Attending: Family Medicine | Admitting: Family Medicine

## 2013-03-26 ENCOUNTER — Other Ambulatory Visit: Payer: Self-pay | Admitting: Family Medicine

## 2013-03-26 DIAGNOSIS — R928 Other abnormal and inconclusive findings on diagnostic imaging of breast: Secondary | ICD-10-CM

## 2013-03-26 DIAGNOSIS — N63 Unspecified lump in unspecified breast: Secondary | ICD-10-CM | POA: Insufficient documentation

## 2013-03-26 DIAGNOSIS — N6320 Unspecified lump in the left breast, unspecified quadrant: Secondary | ICD-10-CM

## 2013-03-26 DIAGNOSIS — N6009 Solitary cyst of unspecified breast: Secondary | ICD-10-CM | POA: Insufficient documentation

## 2013-04-09 ENCOUNTER — Other Ambulatory Visit: Payer: Self-pay | Admitting: Family Medicine

## 2013-04-09 ENCOUNTER — Other Ambulatory Visit (HOSPITAL_COMMUNITY): Payer: Self-pay | Admitting: Family Medicine

## 2013-04-09 ENCOUNTER — Encounter (HOSPITAL_COMMUNITY): Payer: Self-pay

## 2013-04-09 ENCOUNTER — Ambulatory Visit (HOSPITAL_COMMUNITY)
Admission: RE | Admit: 2013-04-09 | Discharge: 2013-04-09 | Disposition: A | Discharge status: Home / Self Care | Payer: Medicare Other | Source: Ambulatory Visit | Attending: Family Medicine | Admitting: Family Medicine

## 2013-04-09 DIAGNOSIS — R928 Other abnormal and inconclusive findings on diagnostic imaging of breast: Secondary | ICD-10-CM

## 2013-04-09 DIAGNOSIS — N63 Unspecified lump in unspecified breast: Secondary | ICD-10-CM | POA: Insufficient documentation

## 2013-04-09 DIAGNOSIS — N6009 Solitary cyst of unspecified breast: Secondary | ICD-10-CM | POA: Insufficient documentation

## 2013-04-09 DIAGNOSIS — N6320 Unspecified lump in the left breast, unspecified quadrant: Secondary | ICD-10-CM

## 2013-04-09 MED ORDER — LIDOCAINE HCL (PF) 2 % IJ SOLN
INTRAMUSCULAR | Status: AC
  Filled 2013-04-09: qty 10

## 2013-06-17 ENCOUNTER — Emergency Department (HOSPITAL_COMMUNITY): Payer: Medicare Other

## 2013-06-17 ENCOUNTER — Inpatient Hospital Stay (HOSPITAL_COMMUNITY)
Admission: EM | Admit: 2013-06-17 | Discharge: 2013-06-26 | DRG: 377 | Disposition: A | Discharge status: Home Health | Payer: Medicare Other | Attending: Internal Medicine | Admitting: Internal Medicine

## 2013-06-17 ENCOUNTER — Encounter (HOSPITAL_COMMUNITY): Payer: Self-pay | Admitting: Radiology

## 2013-06-17 ENCOUNTER — Inpatient Hospital Stay (HOSPITAL_COMMUNITY): Payer: Medicare Other

## 2013-06-17 DIAGNOSIS — E876 Hypokalemia: Secondary | ICD-10-CM | POA: Diagnosis present

## 2013-06-17 DIAGNOSIS — R109 Unspecified abdominal pain: Secondary | ICD-10-CM

## 2013-06-17 DIAGNOSIS — K5732 Diverticulitis of large intestine without perforation or abscess without bleeding: Secondary | ICD-10-CM | POA: Diagnosis present

## 2013-06-17 DIAGNOSIS — K439 Ventral hernia without obstruction or gangrene: Secondary | ICD-10-CM | POA: Diagnosis present

## 2013-06-17 DIAGNOSIS — J90 Pleural effusion, not elsewhere classified: Secondary | ICD-10-CM | POA: Diagnosis not present

## 2013-06-17 DIAGNOSIS — G8929 Other chronic pain: Secondary | ICD-10-CM | POA: Diagnosis present

## 2013-06-17 DIAGNOSIS — E872 Acidosis, unspecified: Secondary | ICD-10-CM | POA: Diagnosis present

## 2013-06-17 DIAGNOSIS — R8271 Bacteriuria: Secondary | ICD-10-CM | POA: Diagnosis present

## 2013-06-17 DIAGNOSIS — F411 Generalized anxiety disorder: Secondary | ICD-10-CM | POA: Diagnosis not present

## 2013-06-17 DIAGNOSIS — A0472 Enterocolitis due to Clostridium difficile, not specified as recurrent: Secondary | ICD-10-CM | POA: Diagnosis present

## 2013-06-17 DIAGNOSIS — K559 Vascular disorder of intestine, unspecified: Secondary | ICD-10-CM | POA: Diagnosis present

## 2013-06-17 DIAGNOSIS — J810 Acute pulmonary edema: Secondary | ICD-10-CM | POA: Diagnosis present

## 2013-06-17 DIAGNOSIS — R82998 Other abnormal findings in urine: Secondary | ICD-10-CM | POA: Diagnosis present

## 2013-06-17 DIAGNOSIS — R799 Abnormal finding of blood chemistry, unspecified: Secondary | ICD-10-CM

## 2013-06-17 DIAGNOSIS — R633 Feeding difficulties, unspecified: Secondary | ICD-10-CM | POA: Diagnosis present

## 2013-06-17 DIAGNOSIS — E039 Hypothyroidism, unspecified: Secondary | ICD-10-CM

## 2013-06-17 DIAGNOSIS — R7989 Other specified abnormal findings of blood chemistry: Secondary | ICD-10-CM

## 2013-06-17 DIAGNOSIS — D72829 Elevated white blood cell count, unspecified: Secondary | ICD-10-CM | POA: Diagnosis present

## 2013-06-17 DIAGNOSIS — E079 Disorder of thyroid, unspecified: Secondary | ICD-10-CM | POA: Diagnosis present

## 2013-06-17 DIAGNOSIS — E890 Postprocedural hypothyroidism: Secondary | ICD-10-CM | POA: Diagnosis present

## 2013-06-17 DIAGNOSIS — A419 Sepsis, unspecified organism: Secondary | ICD-10-CM | POA: Diagnosis not present

## 2013-06-17 DIAGNOSIS — R778 Other specified abnormalities of plasma proteins: Secondary | ICD-10-CM | POA: Diagnosis present

## 2013-06-17 DIAGNOSIS — I214 Non-ST elevation (NSTEMI) myocardial infarction: Secondary | ICD-10-CM | POA: Diagnosis present

## 2013-06-17 DIAGNOSIS — J811 Chronic pulmonary edema: Secondary | ICD-10-CM | POA: Diagnosis present

## 2013-06-17 DIAGNOSIS — I700 Atherosclerosis of aorta: Secondary | ICD-10-CM | POA: Diagnosis present

## 2013-06-17 DIAGNOSIS — K5731 Diverticulosis of large intestine without perforation or abscess with bleeding: Principal | ICD-10-CM | POA: Diagnosis present

## 2013-06-17 DIAGNOSIS — R6521 Severe sepsis with septic shock: Secondary | ICD-10-CM

## 2013-06-17 DIAGNOSIS — A267 Erysipelothrix sepsis: Secondary | ICD-10-CM

## 2013-06-17 DIAGNOSIS — K5733 Diverticulitis of large intestine without perforation or abscess with bleeding: Secondary | ICD-10-CM

## 2013-06-17 DIAGNOSIS — I4891 Unspecified atrial fibrillation: Secondary | ICD-10-CM | POA: Diagnosis present

## 2013-06-17 DIAGNOSIS — Z85850 Personal history of malignant neoplasm of thyroid: Secondary | ICD-10-CM

## 2013-06-17 DIAGNOSIS — R748 Abnormal levels of other serum enzymes: Secondary | ICD-10-CM | POA: Diagnosis present

## 2013-06-17 DIAGNOSIS — H3530 Unspecified macular degeneration: Secondary | ICD-10-CM | POA: Diagnosis present

## 2013-06-17 DIAGNOSIS — N3289 Other specified disorders of bladder: Secondary | ICD-10-CM | POA: Diagnosis present

## 2013-06-17 DIAGNOSIS — M1990 Unspecified osteoarthritis, unspecified site: Secondary | ICD-10-CM | POA: Diagnosis present

## 2013-06-17 DIAGNOSIS — I959 Hypotension, unspecified: Secondary | ICD-10-CM | POA: Diagnosis present

## 2013-06-17 DIAGNOSIS — I10 Essential (primary) hypertension: Secondary | ICD-10-CM

## 2013-06-17 DIAGNOSIS — I447 Left bundle-branch block, unspecified: Secondary | ICD-10-CM | POA: Diagnosis present

## 2013-06-17 DIAGNOSIS — K828 Other specified diseases of gallbladder: Secondary | ICD-10-CM | POA: Diagnosis present

## 2013-06-17 DIAGNOSIS — J9819 Other pulmonary collapse: Secondary | ICD-10-CM | POA: Diagnosis present

## 2013-06-17 HISTORY — DX: Gastrointestinal hemorrhage, unspecified: K92.2

## 2013-06-17 LAB — COMPREHENSIVE METABOLIC PANEL
ALT: 18 U/L (ref 0–35)
AST: 20 U/L (ref 0–37)
Albumin: 2.8 g/dL — ABNORMAL LOW (ref 3.5–5.2)
Alkaline Phosphatase: 72 U/L (ref 39–117)
BUN: 13 mg/dL (ref 6–23)
CALCIUM: 7.4 mg/dL — AB (ref 8.4–10.5)
CO2: 24 mEq/L (ref 19–32)
Chloride: 105 mEq/L (ref 96–112)
Creatinine, Ser: 0.75 mg/dL (ref 0.50–1.10)
GFR calc Af Amer: >90 mL/min (ref >90)
GFR, EST NON AFRICAN AMERICAN: 78 mL/min — AB (ref >90)
Glucose, Bld: 123 mg/dL — ABNORMAL HIGH (ref 70–99)
Potassium: 3 mEq/L — ABNORMAL LOW (ref 3.7–5.3)
SODIUM: 142 meq/L (ref 137–147)
TOTAL PROTEIN: 5.8 g/dL — AB (ref 6.0–8.3)
Total Bilirubin: 1 mg/dL (ref 0.3–1.2)

## 2013-06-17 LAB — CBC WITH DIFFERENTIAL/PLATELET
BASOS ABS: 0 10*3/uL (ref 0.0–0.1)
BASOS PCT: 0 % (ref 0–1)
EOS ABS: 0 10*3/uL (ref 0.0–0.7)
EOS PCT: 0 % (ref 0–5)
HEMATOCRIT: 38.5 % (ref 36.0–46.0)
Hemoglobin: 12.8 g/dL (ref 12.0–15.0)
Lymphocytes Relative: 6 % — ABNORMAL LOW (ref 12–46)
Lymphs Abs: 1.2 10*3/uL (ref 0.7–4.0)
MCH: 29 pg (ref 26.0–34.0)
MCHC: 33.2 g/dL (ref 30.0–36.0)
MCV: 87.3 fL (ref 78.0–100.0)
Monocytes Absolute: 1.3 10*3/uL — ABNORMAL HIGH (ref 0.1–1.0)
Monocytes Relative: 6 % (ref 3–12)
Neutro Abs: 17.2 10*3/uL — ABNORMAL HIGH (ref 1.7–7.7)
Neutrophils Relative %: 87 % — ABNORMAL HIGH (ref 43–77)
PLATELETS: 210 10*3/uL (ref 150–400)
RBC: 4.41 MIL/uL (ref 3.87–5.11)
RDW: 14.4 % (ref 11.5–15.5)
WBC: 19.7 10*3/uL — ABNORMAL HIGH (ref 4.0–10.5)

## 2013-06-17 LAB — URINALYSIS, ROUTINE W REFLEX MICROSCOPIC
Bilirubin Urine: NEGATIVE
GLUCOSE, UA: NEGATIVE mg/dL
Ketones, ur: 15 mg/dL — AB
Nitrite: NEGATIVE
PROTEIN: NEGATIVE mg/dL
Specific Gravity, Urine: 1.013 (ref 1.005–1.030)
UROBILINOGEN UA: 0.2 mg/dL (ref 0.0–1.0)
pH: 5.5 (ref 5.0–8.0)

## 2013-06-17 LAB — CG4 I-STAT (LACTIC ACID): Lactic Acid, Venous: 2.25 mmol/L — ABNORMAL HIGH (ref 0.5–2.2)

## 2013-06-17 LAB — URINE MICROSCOPIC-ADD ON

## 2013-06-17 LAB — LACTIC ACID, PLASMA
Lactic Acid, Venous: 3.3 mmol/L — ABNORMAL HIGH (ref 0.5–2.2)
Lactic Acid, Venous: 2.7 mmol/L — ABNORMAL HIGH (ref 0.5–2.2)

## 2013-06-17 LAB — LIPASE, BLOOD: Lipase: 26 U/L (ref 11–59)

## 2013-06-17 LAB — CBC
HCT: 39.2 % (ref 36.0–46.0)
HEMOGLOBIN: 13.1 g/dL (ref 12.0–15.0)
MCH: 29.3 pg (ref 26.0–34.0)
MCHC: 33.4 g/dL (ref 30.0–36.0)
MCV: 87.7 fL (ref 78.0–100.0)
Platelets: 206 10*3/uL (ref 150–400)
RBC: 4.47 MIL/uL (ref 3.87–5.11)
RDW: 14.5 % (ref 11.5–15.5)
WBC: 20.9 10*3/uL — ABNORMAL HIGH (ref 4.0–10.5)

## 2013-06-17 LAB — TYPE AND SCREEN
ABO/RH(D): A POS
Antibody Screen: NEGATIVE

## 2013-06-17 LAB — MRSA PCR SCREENING: MRSA BY PCR: NEGATIVE

## 2013-06-17 LAB — OCCULT BLOOD, POC DEVICE: FECAL OCCULT BLD: POSITIVE — AB

## 2013-06-17 LAB — MAGNESIUM: Magnesium: 1.3 mg/dL — ABNORMAL LOW (ref 1.5–2.5)

## 2013-06-17 LAB — TROPONIN I
TROPONIN I: 0.45 ng/mL — AB (ref <0.30)
Troponin I: 0.51 ng/mL (ref <0.30)

## 2013-06-17 LAB — POCT I-STAT TROPONIN I: Troponin i, poc: 0.23 ng/mL (ref 0.00–0.08)

## 2013-06-17 LAB — ABO/RH: ABO/RH(D): A POS

## 2013-06-17 MED ORDER — PROMETHAZINE HCL 25 MG/ML IJ SOLN
12.5000 mg | Freq: Four times a day (QID) | INTRAMUSCULAR | Status: DC | PRN
  Administered 2013-06-18 – 2013-06-20 (×4): 12.5 mg via INTRAVENOUS
  Filled 2013-06-17 (×4): qty 1

## 2013-06-17 MED ORDER — LEVOTHYROXINE SODIUM 100 MCG IV SOLR
50.0000 ug | Freq: Every day | INTRAVENOUS | Status: DC
  Administered 2013-06-18 – 2013-06-19 (×2): 50 ug via INTRAVENOUS
  Filled 2013-06-17 (×2): qty 5

## 2013-06-17 MED ORDER — SODIUM CHLORIDE 0.9 % IV SOLN
1000.0000 mL | Freq: Once | INTRAVENOUS | Status: AC
Start: 2013-06-17 — End: 2013-06-17
  Administered 2013-06-17: 1000 mL via INTRAVENOUS

## 2013-06-17 MED ORDER — METRONIDAZOLE IN NACL 5-0.79 MG/ML-% IV SOLN
500.0000 mg | Freq: Three times a day (TID) | INTRAVENOUS | Status: DC
  Filled 2013-06-17 (×2): qty 100

## 2013-06-17 MED ORDER — MORPHINE SULFATE 4 MG/ML IJ SOLN
8.0000 mg | Freq: Once | INTRAMUSCULAR | Status: AC
  Administered 2013-06-17: 8 mg via INTRAVENOUS
  Filled 2013-06-17: qty 2

## 2013-06-17 MED ORDER — METRONIDAZOLE IN NACL 5-0.79 MG/ML-% IV SOLN
500.0000 mg | Freq: Once | INTRAVENOUS | Status: AC
  Administered 2013-06-17: 500 mg via INTRAVENOUS
  Filled 2013-06-17: qty 100

## 2013-06-17 MED ORDER — CIPROFLOXACIN IN D5W 400 MG/200ML IV SOLN
400.0000 mg | Freq: Two times a day (BID) | INTRAVENOUS | Status: DC
  Filled 2013-06-17: qty 200

## 2013-06-17 MED ORDER — IOHEXOL 350 MG/ML SOLN
100.0000 mL | Freq: Once | INTRAVENOUS | Status: AC | PRN
  Administered 2013-06-17: 100 mL via INTRAVENOUS

## 2013-06-17 MED ORDER — PANTOPRAZOLE SODIUM 40 MG IV SOLR
40.0000 mg | Freq: Two times a day (BID) | INTRAVENOUS | Status: DC
  Administered 2013-06-17: 40 mg via INTRAVENOUS
  Filled 2013-06-17 (×3): qty 40

## 2013-06-17 MED ORDER — MORPHINE SULFATE 4 MG/ML IJ SOLN
4.0000 mg | Freq: Once | INTRAMUSCULAR | Status: AC
  Administered 2013-06-17: 4 mg via INTRAVENOUS
  Filled 2013-06-17: qty 1

## 2013-06-17 MED ORDER — ACETAMINOPHEN 650 MG RE SUPP
325.0000 mg | Freq: Three times a day (TID) | RECTAL | Status: DC | PRN
  Administered 2013-06-17: 325 mg via RECTAL
  Filled 2013-06-17: qty 1

## 2013-06-17 MED ORDER — MORPHINE SULFATE 2 MG/ML IJ SOLN
2.0000 mg | INTRAMUSCULAR | Status: DC | PRN
  Administered 2013-06-17: 4 mg via INTRAVENOUS
  Administered 2013-06-17 – 2013-06-18 (×6): 2 mg via INTRAVENOUS
  Administered 2013-06-19: 4 mg via INTRAVENOUS
  Administered 2013-06-19 – 2013-06-20 (×7): 2 mg via INTRAVENOUS
  Administered 2013-06-20: 4 mg via INTRAVENOUS
  Administered 2013-06-21 – 2013-06-22 (×6): 2 mg via INTRAVENOUS
  Filled 2013-06-17 (×2): qty 1
  Filled 2013-06-17: qty 2
  Filled 2013-06-17 (×2): qty 1
  Filled 2013-06-17: qty 2
  Filled 2013-06-17 (×2): qty 1
  Filled 2013-06-17: qty 2
  Filled 2013-06-17 (×10): qty 1
  Filled 2013-06-17: qty 2
  Filled 2013-06-17 (×4): qty 1

## 2013-06-17 MED ORDER — MAGNESIUM SULFATE 4000MG/100ML IJ SOLN
4.0000 g | Freq: Once | INTRAMUSCULAR | Status: AC
  Administered 2013-06-17: 4 g via INTRAVENOUS
  Filled 2013-06-17: qty 100

## 2013-06-17 MED ORDER — ASPIRIN 325 MG PO TABS
325.0000 mg | ORAL_TABLET | Freq: Once | ORAL | Status: DC
  Filled 2013-06-17: qty 1

## 2013-06-17 MED ORDER — GI COCKTAIL ~~LOC~~
30.0000 mL | Freq: Once | ORAL | Status: AC
  Administered 2013-06-17: 30 mL via ORAL
  Filled 2013-06-17: qty 30

## 2013-06-17 MED ORDER — PIPERACILLIN-TAZOBACTAM 3.375 G IVPB
3.3750 g | Freq: Three times a day (TID) | INTRAVENOUS | Status: DC
  Administered 2013-06-17 – 2013-06-18 (×3): 3.375 g via INTRAVENOUS
  Filled 2013-06-17 (×4): qty 50

## 2013-06-17 MED ORDER — VANCOMYCIN HCL IN DEXTROSE 750-5 MG/150ML-% IV SOLN
750.0000 mg | Freq: Two times a day (BID) | INTRAVENOUS | Status: DC
  Filled 2013-06-17 (×2): qty 150

## 2013-06-17 MED ORDER — SODIUM CHLORIDE 0.9 % IV SOLN
1000.0000 mL | INTRAVENOUS | Status: DC
  Administered 2013-06-17: 1000 mL via INTRAVENOUS

## 2013-06-17 MED ORDER — SODIUM CHLORIDE 0.9 % IV SOLN
INTRAVENOUS | Status: AC
  Administered 2013-06-17: 16:00:00 via INTRAVENOUS

## 2013-06-17 MED ORDER — POTASSIUM CHLORIDE 10 MEQ/100ML IV SOLN
10.0000 meq | INTRAVENOUS | Status: AC
  Administered 2013-06-17 – 2013-06-18 (×6): 10 meq via INTRAVENOUS
  Filled 2013-06-17 (×2): qty 100

## 2013-06-17 MED ORDER — ONDANSETRON HCL 4 MG/2ML IJ SOLN
4.0000 mg | Freq: Once | INTRAMUSCULAR | Status: AC
  Administered 2013-06-17: 4 mg via INTRAVENOUS
  Filled 2013-06-17: qty 2

## 2013-06-17 MED ORDER — SODIUM CHLORIDE 0.9 % IV BOLUS (SEPSIS)
1000.0000 mL | Freq: Once | INTRAVENOUS | Status: AC
  Administered 2013-06-17: 1000 mL via INTRAVENOUS

## 2013-06-17 MED ORDER — SODIUM CHLORIDE 0.9 % IJ SOLN
3.0000 mL | Freq: Two times a day (BID) | INTRAMUSCULAR | Status: DC
  Administered 2013-06-18 – 2013-06-26 (×12): 3 mL via INTRAVENOUS

## 2013-06-17 MED ORDER — VANCOMYCIN HCL 10 G IV SOLR
1500.0000 mg | Freq: Once | INTRAVENOUS | Status: AC
  Administered 2013-06-17: 1500 mg via INTRAVENOUS
  Filled 2013-06-17: qty 1500

## 2013-06-17 MED ORDER — CIPROFLOXACIN IN D5W 400 MG/200ML IV SOLN
400.0000 mg | Freq: Once | INTRAVENOUS | Status: AC
  Administered 2013-06-17: 400 mg via INTRAVENOUS
  Filled 2013-06-17: qty 200

## 2013-06-17 NOTE — ED Notes (Signed)
Troponin results shown to primary nurse and Senaida Lange, PA-C

## 2013-06-17 NOTE — Consult Note (Signed)
CONSULT NOTE  Date: 06/17/2013               Patient Name:  Danielle [name] MRN: [id number]  DOB: 09-07-1930 Age / Sex: 80 y.o., female        PCP: DONDIEGO,RICHARD M Primary Cardiologist: New/ Kellan Boehlke            Referring Physician: Vanita Panda              Reason for Consult: + POC troponin, LBBB           History of Present Illness: Patient is a 80 y.o. female with a PMHx of hypertension and thyroid disease, who was admitted to [facility] on 06/17/2013 for evaluation of several days of abdominal pain and diarrhea. She was noted have a left bundle branch block which was not seen on previous EKGs. Her point-of-care troponin level is mildly elevated..   10 history was somewhat difficult. She's already had 8 mg of morphine. She complains of intense abdominal pain. She denies any chest pain. She's had vomiting of heme-positive emesis. She also has had diarrhea today which is also guaiac-positive.  Medications: Outpatient medications:  (Not in a hospital admission)  Current medications: Current Facility-Administered Medications  Medication Dose Route Frequency Provider Last Rate Last Dose  . 0.9 %  sodium chloride infusion  1,000 mL Intravenous Continuous Carmin Muskrat, MD      . aspirin tablet 325 mg  325 mg Oral Once Carmin Muskrat, MD      . morphine 4 MG/ML injection 4 mg  4 mg Intravenous Once Carmin Muskrat, MD       Current Outpatient Prescriptions  Medication Sig Dispense Refill  . amLODipine (NORVASC) 5 MG tablet Take 5 mg by mouth daily.        Marland Kitchen levothyroxine (SYNTHROID, LEVOTHROID) 100 MCG tablet Take 100 mcg by mouth daily. Do not take synthroid on Mon and Thursdays      . metoprolol (LOPRESSOR) 50 MG tablet Take 25 mg by mouth daily.          Allergies  Allergen Reactions  . Thyroid Hormones     proparathyroid thyroid medication     Past Medical History  Diagnosis Date  . Osteoarthritis   . Macular degeneration   . Hypertension   . Thyroid disease     . Thyroid cancer     Past Surgical History  Procedure Laterality Date  . Thyroidectomy    . Colonoscopy  03/17/2011    Procedure: COLONOSCOPY;  Surgeon: Rogene Houston, MD;  Location: [facility];  Service: Endoscopy;  Laterality: N/A;    No family history on file.  Social History:  reports that she has never smoked. She does not have any smokeless tobacco history on file. She reports that she does not drink alcohol or use illicit drugs.   Review of Systems: Constitutional:  denies fever, chills, diaphoresis, appetite change and fatigue.  HEENT: denies photophobia, eye pain, redness, hearing loss, ear pain, congestion, sore throat, rhinorrhea, sneezing, neck pain, neck stiffness and tinnitus.  Respiratory: denies SOB, DOE, cough, chest tightness, and wheezing.  Cardiovascular: denies chest pain, palpitations and leg swelling.  Gastrointestinal: admits to nausea, vomiting, abdominal pain, diarrhea,  blood in stool.  Genitourinary: admits to   Urgency,   Musculoskeletal: denies  myalgias, back pain, joint swelling, arthralgias and gait problem.   Skin: denies pallor, rash and wound.  Neurological: denies dizziness, seizures, syncope, weakness, light-headedness, numbness and headaches.  Hematological: denies adenopathy, easy bruising, personal or family bleeding history.  Psychiatric/ Behavioral: denies suicidal ideation, mood changes, confusion, nervousness, sleep disturbance and agitation.    Physical Exam: BP 128/60  Pulse 110  Temp(Src) 99.6 F (37.6 C) (Oral)  Resp 18  Ht 5\' 7"  (1.702 m)  Wt 169 lb 4 oz (76.771 kg)  BMI 26.50 kg/m2  SpO2 98%  Wt Readings from Last 3 Encounters:  06/17/13 169 lb 4 oz (76.771 kg)  03/28/11 169 lb 6.4 oz (76.839 kg)  03/16/11 165 lb 9.1 oz (75.1 kg)    General: Vital signs reviewed and noted. Well-developed, well-nourished, she appears to be in moderate pain.   Head: Normocephalic, atraumatic, sclera anicteric,   Neck: Supple.  Negative for carotid bruits. No JVD   Lungs:  Clear bilaterally, no  wheezes, rales, or rhonchi. Breathing is normal   Heart: RRR with S1 S2. No murmurs, rubs, or gallops   Abdomen:   her abdomen is moderately distended. She has a few bowel sounds. It is tender to palpation especially in the lower quadrants.   There is no  rebound.   MSK: Strength and the appear normal for age.   Extremities: No clubbing or cyanosis. No edema.  Distal pedal pulses are 2+ and equal   Neurologic: Alert and oriented X 3. Moves all extremities spontaneously.  Psych:  she is somnolent. She's had morphine in the emergency room.     Lab results: Basic Metabolic Panel:  Recent Labs Lab 06/17/13 0845  NA 142  K 3.0*  CL 105  CO2 24  GLUCOSE 123*  BUN 13  CREATININE 0.75  CALCIUM 7.4*    Liver Function Tests:  Recent Labs Lab 06/17/13 0845  AST 20  ALT 18  ALKPHOS 72  BILITOT 1.0  PROT 5.8*  ALBUMIN 2.8*    Recent Labs Lab 06/17/13 0845  LIPASE 26   No results found for this basename: AMMONIA,  in the last 168 hours  CBC:  Recent Labs Lab 06/17/13 0845  WBC 19.7*  NEUTROABS 17.2*  HGB 12.8  HCT 38.5  MCV 87.3  PLT 210    Cardiac Enzymes: No results found for this basename: CKTOTAL, CKMB, CKMBINDEX, TROPONINI,  in the last 168 hours  BNP: No components found with this basename: POCBNP,   CBG: No results found for this basename: GLUCAP,  in the last 168 hours  Coagulation Studies: No results found for this basename: LABPROT, INR,  in the last 72 hours   Other results: EKG:  Normal sinus rhythm with frequent premature atrial contractions. She has a left bundle branch block which is new from her last EKG 2012.  Imaging: Dg Chest Portable 1 View  06/17/2013   CLINICAL DATA:  Abdominal pain, nausea and vomiting  EXAM: PORTABLE CHEST - 1 VIEW  COMPARISON:  07/25/2004  FINDINGS: Cardiomegaly is noted. Central vascular congestion without convincing pulmonary edema. Mild right  basilar atelectasis. Hazy left basilar atelectasis or infiltrate. Probable small left pleural effusion.  IMPRESSION: Central vascular congestion without convincing pulmonary edema. Mild right basilar atelectasis. Hazy left basilar atelectasis or infiltrate. Probable small left pleural effusion.   Electronically Signed   By: Lahoma Crocker M.D.   On: 06/17/2013 08:31   Dg Abd Portable 1v  06/17/2013   CLINICAL DATA:  Abdominal pain  EXAM: PORTABLE ABDOMEN - 1 VIEW  COMPARISON:  None.  FINDINGS: Single portable decubitus view of the abdomen reveals no definitive free air. No acute bony abnormality is  noted. Degenerative changes of the lumbar spine are seen.   Electronically Signed   By: Inez Catalina M.D.   On: 06/17/2013 08:39      Assessment & Plan:  1. Abdominal pain:  [name] presents with intense abdominal pain, nausea, diarrhea. She has evidence of a GI bleed. She is very tender in her abdomen. I could not elicit any rebound.   A CT scan has been performed and results are pending.  Her white blood cell count is 19.7. Hemoglobin is stable at 12.8. Her electrolytes are unremarkable. Her troponin point-of-care 0.23. Repeat troponin in the main LAD will be drawn soon.   Her lactic acid level is elevated.  At this point she is being admitted by the medicine team. I do not think that this abdominal pain is an angina equivalent.  2. Elevated point-of-care troponin level: The patient was incidentally noted to have a left bundle branch block which is new. A point-of-care troponin was found to be 0.23. A repeat troponin level has been drawn but is not back yet.  Point-of-care troponins are not as specific as we would like.   Multiple noncardiac issues can cause an elevated troponin levels. It certainly is possible that she has an intra-abdominal process that is causing this mild troponin level.  She does have a left bundle branch block which was not seen on her previous EKG in 2000.  At this point we  are very limited in what we would be able to do from a cardiac standpoint. She's not a candidate for urgent cardiac catheterization with stenting since she has an active GI bleed. In addition, I think she has an active infection (elevated white blood cell count and lactic acidosis) and this would be a relative contraindication to urgent heart catheterization.  I would not give her heparin or aspirin.  We will get an echocardiogram during his hospitalization.  The timing of her cardiac workup will be dependent on her GI issues.  It would be reasonable to perform a Myoview study at a later time when she is stable from her intra-abdominal issue.    3. Hypertension: Stable for now    Ramond Dial., MD, [facility] 06/17/2013, 10:15 AM Office - [phone] Pager [phone]

## 2013-06-17 NOTE — ED Provider Notes (Signed)
CSN: [id number]     Arrival date & time 06/17/13  0938 History   First MD Initiated Contact with Patient 06/17/13 [phone]     Chief Complaint  Patient presents with  . Abdominal Pain  . Diarrhea      HPI  Patient presents with several days of abdominal pain, nausea, vomiting, diarrhea. Symptoms seem to have begun approximately 48 hours ago. Since onset symptoms been persistent, worse over the past day. Pain is diffuse, though mostly present in the upper abdomen.  Pain is severe, sore, crampy. There is no nausea, vomiting, diarrhea, last bowel movement was within the last hour. No melena, no bright red blood per rectum. No relief with anything. No chest pain, dyspnea, lightheadedness, near syncope No fever. Patient has a history of colitis, GI bleed. Patient also has a chronic pain for which she takes narcotics.   Past Medical History  Diagnosis Date  . Osteoarthritis   . Macular degeneration   . Hypertension   . Thyroid disease   . Thyroid cancer    Past Surgical History  Procedure Laterality Date  . Thyroidectomy    . Colonoscopy  03/17/2011    Procedure: COLONOSCOPY;  Surgeon: Rogene Houston, MD;  Location: [facility];  Service: Endoscopy;  Laterality: N/A;   No family history on file. History  Substance Use Topics  . Smoking status: Never Smoker   . Smokeless tobacco: Not on file  . Alcohol Use: No   OB History   Grav Para Term Preterm Abortions TAB SAB Ect Mult Living                 Review of Systems  Constitutional:       Per HPI, otherwise negative  HENT:       Per HPI, otherwise negative  Respiratory:       Per HPI, otherwise negative  Cardiovascular:       Per HPI, otherwise negative  Gastrointestinal: Positive for vomiting. Negative for blood in stool.  Endocrine:       Negative aside from HPI  Genitourinary:       Neg aside from HPI   Musculoskeletal:       Per HPI, otherwise negative  Skin: Negative.   Neurological: Negative for syncope.       Allergies  Thyroid hormones  Home Medications   Current Outpatient Rx  Name  Route  Sig  Dispense  Refill  . amLODipine (NORVASC) 5 MG tablet   Oral   Take 5 mg by mouth daily.           Marland Kitchen levothyroxine (SYNTHROID, LEVOTHROID) 100 MCG tablet   Oral   Take 100 mcg by mouth daily. Do not take synthroid on Mon and Thursdays         . metoprolol (LOPRESSOR) 50 MG tablet   Oral   Take 25 mg by mouth daily.          . pantoprazole (PROTONIX) 40 MG injection   Intravenous   Inject 40 mg into the vein daily.   1 each      . EXPIRED: pantoprazole (PROTONIX) 40 MG tablet   Oral   Take 1 tablet (40 mg total) by mouth daily.   30 tablet   1   . EXPIRED: potassium chloride (K-DUR) 10 MEQ tablet   Oral   Take 2 tablets (20 mEq total) by mouth 2 (two) times daily.   6 tablet   0    BP 145/55  Pulse 94  Temp(Src) 100.4 F (38 C) (Oral)  Ht 5\' 7"  (1.702 m)  Wt 169 lb 4 oz (76.771 kg)  BMI 26.50 kg/m2  SpO2 94% Physical Exam  Nursing note and vitals reviewed. Constitutional: She is oriented to person, place, and time. She appears well-developed and well-nourished.  Uncomfortable appearing elderly F  HENT:  Head: Normocephalic and atraumatic.  Eyes: Conjunctivae and EOM are normal.  Cardiovascular: Normal rate and regular rhythm.   Pulmonary/Chest: Effort normal and breath sounds normal. No stridor. No respiratory distress.  Abdominal: There is generalized tenderness. There is no rigidity, no rebound and no guarding.  Musculoskeletal: She exhibits no edema.  Neurological: She is alert and oriented to person, place, and time. No cranial nerve deficit.  Skin: Skin is warm.  Psychiatric: Her mood appears anxious.    ED Course  Procedures (including critical care time) Labs Review Labs Reviewed  CBC WITH DIFFERENTIAL  COMPREHENSIVE METABOLIC PANEL  LIPASE, BLOOD  URINALYSIS, ROUTINE W REFLEX MICROSCOPIC  POCT GASTRIC OCCULT BLOOD (1-CARD TO LAB)  TYPE  AND SCREEN   Imaging Review No results found.  EKG Interpretation    Date/Time:  Tuesday June 17 2013 07:11:42 EST Ventricular Rate:  98 PR Interval:  149 QRS Duration: 149 QT Interval:  386 QTC Calculation: 493 R Axis:   -6 Text Interpretation:  Sinus tachycardia Supraventricular bigeminy Probable left atrial enlargement Left bundle branch block Sinus tachycardia Left bundle branch block , new Abnormal ekg Confirmed by Carmin Muskrat  MD ([id number]) on 06/17/2013 7:54:36 AM           8:55 AM Patient substantially calmer w morphine, zofran.   I viewed the portable films - no gross free air.  Update: Troponin positive, lactic acid positive, leukocytosis, fecal occult blood positive. Patient already had IV fluids started, type and screen sent. On repeat exam the patient appears better. Vital signs remain consistent. I have discussed her case with her cardiologist, will follow as a consulting team.  Patient continues to have no chest pain, no dyspnea. Patient provided aspirin, but with positive GI bleed, history of GI bleed, heparin will not be provided initially. Aspirin is also not indicated, with likelihood of metabolic demand as etiology for troponin leak.   12:07 PM Troponin is elevated. Patient continues to pain as well, additional analgesia provided.    MDM   Patient presents with several days of nausea, vomiting, diarrhea, abdominal pain.  On exam she is uncomfortable appearing, tachycardic.  Patient has tenderness to palpation about the abdomen, given her age, history, ischemia was initial consideration in addition to infectious cause.  The patient did have new left bundle branch block, absent chest pain, dyspnea, cardiac history, Code STEMI was not indicated. Regardless, I discussed the patient's case early in her course with our cardiology team.  Recognition is for no heparin, no aspirin given the ongoing GI bleed.  Cardiology will follow the patient's  case.  Patient's other labs are notable for lactic acidosis, leukocytosis.  Patient's CT scan is consistent with diverticulitis, with no evidence of pneumoperitoneum or abscess. Patient did improve after each dose of analgesia, but continued to have pain. The patient's elevated troponin, lactic acidosis, concern for ongoing infection, after provision of antibiotics, fluids, she reported addition to the step down unit for further evaluation and management.  CRITICAL CARE Performed by: Carmin Muskrat Total critical care time: 45 Critical care time was exclusive of separately billable procedures and treating other patients. Critical care was necessary to  treat or prevent imminent or life-threatening deterioration. Critical care was time spent personally by me on the following activities: development of treatment plan with patient and/or surrogate as well as nursing, discussions with consultants, evaluation of patient's response to treatment, examination of patient, obtaining history from patient or surrogate, ordering and performing treatments and interventions, ordering and review of laboratory studies, ordering and review of radiographic studies, pulse oximetry and re-evaluation of patient's condition.      Carmin Muskrat, MD 06/17/13 1210

## 2013-06-17 NOTE — H&P (Signed)
Date: 06/17/2013               Patient Name:  Danielle [name] MRN: [id number]  DOB: 09-Mar-1931 Age / Sex: 80 y.o., female   PCP: [name], MD         Medical Service: Internal Medicine Teaching Service         Attending Physician: Dr. Drucilla Schmidt    First Contact: Dr. Heber Worton Pager: [phone]  Second Contact: Dr. Alice Rieger Pager: [phone]       After Hours (After 5p/  First Contact Pager: [phone]  weekends / holidays): Second Contact Pager: [phone]   Chief Complaint: Abdominal pain  History of Present Illness:  Danielle [name] is a 80yo woman with history of HTN & hypothyroidism (s/p thyroidectomy d/t thyroid Ca) who presents with 2 days of worsening upper abdominal pain described as 10/10, sore and crampy.  Not similar to anything she has experienced in the past, but was hospitalized in 2012 with diverticulitis. Pain was severe to the point of passing out last night.  She had an episode of emesis at 1 am (unclear if bloody) and husband reports another episode since arrival at the hospital. She reports non bloody diarrhea, but unable to quantify number of episodes or when it started.  [name]. Denies fever/chills. Denies chest pain, SOB, palpitations. Reports lightheadedness/dizziness. Denies NSAID use.  She also reports she is not taking a PPI.  In the ED, she was treated with 2L NS boluses, a GI cocktail, a total of 12mg  of IV morphine, and 4mg  IV zofran.   Review of Systems: HEENT: Denies photophobia, eye pain, redness, hearing loss, ear pain, congestion, sore throat, rhinorrhea, sneezing, mouth sores, trouble swallowing, neck pain, neck stiffness and tinnitus.  Respiratory: Denies SOB, DOE, cough, chest tightness, and wheezing.  Cardiovascular: Denies chest pain, palpitations and leg swelling.  Gastrointestinal: per HPI Genitourinary: Denies dysuria, urgency, frequency, hematuria, flank pain and difficulty urinating.  Musculoskeletal:  +chronic arthralgias Skin: Denies pallor, rash and wound.  Neurological: Denies seizures, numbness and headaches.   Meds: Current Facility-Administered Medications  Medication Dose Route Frequency Provider Last Rate Last Dose  . 0.9 %  sodium chloride infusion  1,000 mL Intravenous Continuous Carmin Muskrat, MD 125 mL/hr at 06/17/13 1015 1,000 mL at 06/17/13 1015  . aspirin tablet 325 mg  325 mg Oral Once Carmin Muskrat, MD      . ciprofloxacin (CIPRO) IVPB 400 mg  400 mg Intravenous Once Carmin Muskrat, MD      . metroNIDAZOLE (FLAGYL) IVPB 500 mg  500 mg Intravenous Once Carmin Muskrat, MD       Current Outpatient Prescriptions  Medication Sig Dispense Refill  . amLODipine (NORVASC) 5 MG tablet Take 5 mg by mouth daily.        Marland Kitchen levothyroxine (SYNTHROID, LEVOTHROID) 100 MCG tablet Take 100 mcg by mouth daily. Do not take synthroid on Mon and Thursdays      . metoprolol (LOPRESSOR) 50 MG tablet Take 25 mg by mouth daily.         Allergies: Allergies as of 06/17/2013 - Review Complete 06/17/2013  Allergen Reaction Noted  . ProParathyroid medication  03/16/2011   Past Medical History  Diagnosis Date  . Osteoarthritis   . Macular degeneration   . Hypertension   . Thyroid disease   . Thyroid cancer    Past Surgical History  Procedure Laterality Date  . Thyroidectomy    . Colonoscopy  03/17/2011    Procedure: COLONOSCOPY;  Surgeon: Rogene Houston, MD;  Location: [facility];  Service: Endoscopy;  Laterality: N/A;   No family history on file.  History   Social History  . Marital Status: Married    Spouse Name: N/A    Number of Children: 2  . Years of Education: Post HS   Occupational History  . 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 Smoking status: Never Smoker   . Smokeless tobacco: Not on file  . Alcohol Use: No  . Drug Use: No  . Sexual Activity:    Other Topics Concern  . Not on file    Social History Narrative   Lives with her husband, active.    Physical Exam: Blood pressure 128/60, pulse 110, temperature 99.6 F (37.6 C), temperature source Oral, resp. rate 18, height 5\' 7"  (1.702 m), weight 169 lb 4 oz (76.771 kg), SpO2 98.00%. General: resting in bed, in mild distress HEENT: PERRL, EOMI, no scleral icterus Cardiac: tachycardic, no rubs, murmurs or gallops Pulm: clear to auscultation bilaterally but with decreased air movement throughout  Abd: soft, diffusely tender, non-distended, hyperactive BS, +perumbilical hernia, multiple moles on thorax, no rebound Ext: warm and well perfused, no pedal edema Neuro: alert and oriented X3, cranial nerves II-XII grossly intact   Lab results: Basic Metabolic Panel:  Recent Labs  06/17/13 0845  NA 142  K 3.0*  CL 105  CO2 24  GLUCOSE 123*  BUN 13  CREATININE 0.75  CALCIUM 7.4*  AG: 13 Ca corrects to 8.4  Lactic Acid: 2.25 (0.5-2.2)  Liver Function Tests:  Recent Labs  06/17/13 0845  AST 20  ALT 18  ALKPHOS 72  BILITOT 1.0  PROT 5.8*  ALBUMIN 2.8*    Recent Labs  06/17/13 0845  LIPASE 26   CBC:  Recent Labs  06/17/13 0845  WBC 19.7*  NEUTROABS 17.2*  HGB 12.8  HCT 38.5  MCV 87.3  PLT 210   FOBT (+) Gastric Occult ordered in ED - not resulted but (+) per cardiology note  iStat Trop: 0.23 (h) Troponin I: 0.45 (HH)  Urinalysis:  Recent Labs  06/17/13 0935  COLORURINE YELLOW  LABSPEC 1.013  PHURINE 5.5  GLUCOSEU NEGATIVE  HGBUR TRACE*  BILIRUBINUR NEGATIVE  KETONESUR 15*  PROTEINUR NEGATIVE  UROBILINOGEN 0.2  NITRITE NEGATIVE  LEUKOCYTESUR TRACE*    06/17/2013 09:35  WBC, UA 3-6  RBC / HPF 0-2  Squamous Epithelial / LPF FEW (A)  Bacteria, UA MANY (A)   Imaging results:  Ct Abdomen Pelvis Wo Contrast 06/17/2013    IMPRESSION:  1. Patchy atelectasis or infiltrate in left lower lobe.  2. Multiple colonic diverticula. Multiple sigmoid colon diverticula. There is mild  pericolonic stranding and small amount of pericolonic fluid proximal sigmoid colon in left lower quadrant suspicious for mild diverticulitis. No diverticular abscess.  3. Distended urinary bladder without evidence of calcified calculi.  4. Infraumbilical midline ventral hernia containing fat measures about 3.7 cm.  5. Normal appendix.  No pericecal inflammation.  6. Atherosclerotic vascular calcifications.  7. Mild distended gallbladder.  Tiny layering gallstones.  8. Calcified lesion in left adnexa measures 2.3 cm. - may represent calcified ovarian cyst, adnexal fibroid or dermoid  Dg Chest Portable 1 View 06/17/2013   CLINICAL DATA:  Abdominal pain, nausea and vomiting  IMPRESSION: Central vascular congestion without convincing pulmonary edema. Mild right basilar  atelectasis. Hazy left basilar atelectasis or infiltrate. Probable small left pleural effusion.   Electronically Signed   By: Lahoma Crocker M.D.   On: 06/17/2013 08:31   Dg Abd Portable 1v 06/17/2013   CLINICAL DATA:  Abdominal pain  EXAM: PORTABLE ABDOMEN - 1 VIEW  COMPARISON:  None.  FINDINGS: Single portable decubitus view of the abdomen reveals no definitive free air. No acute bony abnormality is noted. Degenerative changes of the lumbar spine are seen.   Electronically Signed   By: Inez Catalina M.D.   On: 06/17/2013 08:39    Other results: EKG: sinus, tachy, irregular, LBBB, QTc 531, LBBB is new from 2012  [place] by Problem: Ms. Eberlin is an 80 yo woman with h/o hypothyroidism, HTN and osteoarthritis who presents with acute onset, worsening abdominal pain, admitted on 06/17/13.  #Abdominal Pain: Most likely secondary to acute diverticulitis given CT scan findings, though CT suggests only mild inflammation, and patient seems to be in significant pain. Though symptoms are different, she had an admission in 03/2011 for BRBPR, during which time she had a colonoscopy that revealed multiple diverticula throughout the colon but  predominantly in the sigmoid colon, & one diverticulum at distal sigmoid colon with edema and a small ulcer felt to be source of bleed but no active bleeding noted.  Discharge summary also mentioned NSAID induced colitis.  At that time she was instructed to d/c NSAIDs and treated with cipro/flagyl x 10d as well as protonix 40 daily. She denies NSAID & EtOH use, so PUD is unlikely but cannot be ruled out, as FOBT + but no BRBPR on this occasion. Ischemic colitis is of concern given WBC of almost 20, mildly elevated lactic acid, question of bloody diarrhea, & pain seems out of proportion; to note CT scan was without contrast. She denies urinary sx, and given constellation, though UA suggests infection, I do not suspect UTI as the etiology. -Admit to SDU (inpatient) given FOBT +, lactic acidosis, pulm congestion on CXR, and possible NSTEMI -NPO -Continue IV abx - cipro/flagyl -Gentle IVF hydration given pulm edema (NS @ 50cc/h), s/p 2L boluses in the ED -Repeat CBC & lactic acid at 1300 --> will go ahead and consult surgery, may consult GI if Hb drops -Protonix 40mg  IV BID -IV potassium supp 15mEq x 6 -AM CMET, CBC -add on Mg level -Pain mgmt with morphine 2-4mg  IV q4h prn (Hold & Call MD if SBP<90, HR<65, RR<10, O2<90, or altered mental status)  #Elevated Troponin: Without chest pain, new LBBB on EKG x 2.  CXR with vascular congestion; to note, though ASA was ordered in the ED, it was NOT given per cardiology recs. -Cycle CE x 2, but currently patient not a candidate for ASA/heparin tx -AM EKG -Echo -Incentive spirometry -Given possible GIB, hold BB for now -Appreciate cardiology input  #HTN: Normotensive at admission. -Hold home amlodipine & metoprolol  #Hypothyroidism: Stable. -Change PO levothyroxine 13mcg to equivalent dose of 70mcg IV  #Code Status: full code, discussed with husband at bedside  #VTE ppx: SCDs given high risk bleed   Dispo: Disposition is deferred at this time,  awaiting improvement of current medical problems. Anticipated discharge in approximately 2-3 day(s).   The patient does have a current PCP (Richard Marcia Brash, MD) and does not need an [facility] hospital follow-up appointment after discharge.  The patient does not have transportation limitations that hinder transportation to clinic appointments.  Signed: Othella Boyer, MD 06/17/2013, 10:52 AM

## 2013-06-17 NOTE — ED Notes (Signed)
Dr. Acie Fredrickson in to see pt. Repeat EKG done. Shown to dr. Acie Fredrickson. Pt c/o severe abd pain 10/10.

## 2013-06-17 NOTE — Consult Note (Signed)
HISTORY AND PHYSICAL  Reason for Consult: ischemic colitis Referring Physician: Dr. Fransisca Kaufmann PCP: Dr. Lucia Gaskins    HPI: Danielle [name] is an 80 year old female with history of hyperthyroidism s/p thyroidectomy. Hypothyroidism, GI bleed of unclear etiology(questionable bleeding ulcer), she presented to [facility] with abdominal pain which started last night at bedtime, she was up all night.  Location of pain is upper abdominal. Her symptoms are severe in severity.  Time pattern is constant.  No previous symptoms.  Associated with nausea and vomiting.  denies hematemesis.  Denies fevers.  Reports anorexia.  Denies diarrhea.  No aggravating factors.  Alleviated with pain medication.  Modifying factors include; Nexium, Zantac and Pepto-bismol.  She has been taking this for quite a while for what sounded like reflux type symptoms.  She eats frequent meals.  This was answered by her granddaughter. The patient was unable to answer most questions due oversedation from medication.  Her last colonoscopy was in 2012 which showed a polyp.  She denies chest pains or shortness of breath at present time.  Family reports intermittent chest pains in previous weeks.  She reports poor appetite.  She has been on prednisone for a month, now on 1 pill per day for arthritis(37m/daily).  She has never had an endoscopy.  Denies alcohol or tobacco use.    Past Medical History  Diagnosis Date  . Osteoarthritis   . Macular degeneration   . Hypertension   . Thyroid disease   . Thyroid cancer     Past Surgical History  Procedure Laterality Date  . Thyroidectomy    . Colonoscopy  03/17/2011    Procedure: COLONOSCOPY;  Surgeon: NRogene Houston MD;  Location: [facility];  Service: Endoscopy;  Laterality: N/A;    No family history on file.  Social History:  reports that she has never smoked. She does not have any smokeless tobacco history on file. She reports that she does not drink alcohol or use illicit  drugs.  Allergies:  Allergies  Allergen Reactions  . Thyroid Hormones     proparathyroid thyroid medication    Medications:  Scheduled Meds: . aspirin  325 mg Oral Once   Continuous Infusions: . sodium chloride 1,000 mL (06/17/13 1015)   PRN Meds:.  Results for orders placed during the hospital encounter of 06/17/13 (from the past 48 hour(s))  CBC WITH DIFFERENTIAL     Status: Abnormal   Collection Time    06/17/13  8:45 AM      Result Value Range   WBC 19.7 (*) 4.0 - 10.5 K/uL   RBC 4.41  3.87 - 5.11 MIL/uL   Hemoglobin 12.8  12.0 - 15.0 g/dL   HCT 38.5  36.0 - 46.0 %   MCV 87.3  78.0 - 100.0 fL   MCH 29.0  26.0 - 34.0 pg   MCHC 33.2  30.0 - 36.0 g/dL   RDW 14.4  11.5 - 15.5 %   Platelets 210  150 - 400 K/uL   Neutrophils Relative % 87 (*) 43 - 77 %   Neutro Abs 17.2 (*) 1.7 - 7.7 K/uL   Lymphocytes Relative 6 (*) 12 - 46 %   Lymphs Abs 1.2  0.7 - 4.0 K/uL   Monocytes Relative 6  3 - 12 %   Monocytes Absolute 1.3 (*) 0.1 - 1.0 K/uL   Eosinophils Relative 0  0 - 5 %   Eosinophils Absolute 0.0  0.0 - 0.7 K/uL   Basophils Relative  0  0 - 1 %   Basophils Absolute 0.0  0.0 - 0.1 K/uL  COMPREHENSIVE METABOLIC PANEL     Status: Abnormal   Collection Time    06/17/13  8:45 AM      Result Value Range   Sodium 142  137 - 147 mEq/L   Potassium 3.0 (*) 3.7 - 5.3 mEq/L   Chloride 105  96 - 112 mEq/L   CO2 24  19 - 32 mEq/L   Glucose, Bld 123 (*) 70 - 99 mg/dL   BUN 13  6 - 23 mg/dL   Creatinine, Ser 0.75  0.50 - 1.10 mg/dL   Calcium 7.4 (*) 8.4 - 10.5 mg/dL   Total Protein 5.8 (*) 6.0 - 8.3 g/dL   Albumin 2.8 (*) 3.5 - 5.2 g/dL   AST 20  0 - 37 U/L   ALT 18  0 - 35 U/L   Alkaline Phosphatase 72  39 - 117 U/L   Total Bilirubin 1.0  0.3 - 1.2 mg/dL   GFR calc non Af Amer 78 (*) >90 mL/min   GFR calc Af Amer >90  >90 mL/min   Comment: (NOTE)     The eGFR has been calculated using the CKD EPI equation.     This calculation has not been validated in all clinical  situations.     eGFR's persistently <90 mL/min signify possible Chronic Kidney     Disease.  LIPASE, BLOOD     Status: None   Collection Time    06/17/13  8:45 AM      Result Value Range   Lipase 26  11 - 59 U/L  TYPE AND SCREEN     Status: None   Collection Time    06/17/13  8:50 AM      Result Value Range   ABO/RH(D) A POS     Antibody Screen NEG     Sample Expiration 06/20/2013    ABO/RH     Status: None   Collection Time    06/17/13  8:50 AM      Result Value Range   ABO/RH(D) A POS    POCT I-STAT TROPONIN I     Status: Abnormal   Collection Time    06/17/13  8:58 AM      Result Value Range   Troponin i, poc 0.23 (*) 0.00 - 0.08 ng/mL   Comment NOTIFIED PHYSICIAN     Comment 3            Comment: Due to the release kinetics of cTnI,     a negative result within the first hours     of the onset of symptoms does not rule out     myocardial infarction with certainty.     If myocardial infarction is still suspected,     repeat the test at appropriate intervals.  CG4 I-STAT (LACTIC ACID)     Status: Abnormal   Collection Time    06/17/13  9:01 AM      Result Value Range   Lactic Acid, Venous 2.25 (*) 0.5 - 2.2 mmol/L  OCCULT BLOOD, POC DEVICE     Status: Abnormal   Collection Time    06/17/13  9:20 AM      Result Value Range   Fecal Occult Bld POSITIVE (*) NEGATIVE  URINALYSIS, ROUTINE W REFLEX MICROSCOPIC     Status: Abnormal   Collection Time    06/17/13  9:35 AM      Result Value  Range   Color, Urine YELLOW  YELLOW   APPearance CLOUDY (*) CLEAR   Specific Gravity, Urine 1.013  1.005 - 1.030   pH 5.5  5.0 - 8.0   Glucose, UA NEGATIVE  NEGATIVE mg/dL   Hgb urine dipstick TRACE (*) NEGATIVE   Bilirubin Urine NEGATIVE  NEGATIVE   Ketones, ur 15 (*) NEGATIVE mg/dL   Protein, ur NEGATIVE  NEGATIVE mg/dL   Urobilinogen, UA 0.2  0.0 - 1.0 mg/dL   Nitrite NEGATIVE  NEGATIVE   Leukocytes, UA TRACE (*) NEGATIVE  URINE MICROSCOPIC-ADD ON     Status: Abnormal    Collection Time    06/17/13  9:35 AM      Result Value Range   Squamous Epithelial / LPF FEW (*) RARE   WBC, UA 3-6  <3 WBC/hpf   RBC / HPF 0-2  <3 RBC/hpf   Bacteria, UA MANY (*) RARE  TROPONIN I     Status: Abnormal   Collection Time    06/17/13 10:58 AM      Result Value Range   Troponin I 0.45 (*) <0.30 ng/mL   Comment:            Due to the release kinetics of cTnI,     a negative result within the first hours     of the onset of symptoms does not rule out     myocardial infarction with certainty.     If myocardial infarction is still suspected,     repeat the test at appropriate intervals.     CRITICAL RESULT CALLED TO, READ BACK BY AND VERIFIED WITH:     K.COBB,RN 06/17/13 1149 BY BSLADE  CBC     Status: Abnormal   Collection Time    06/17/13  1:00 PM      Result Value Range   WBC 20.9 (*) 4.0 - 10.5 K/uL   RBC 4.47  3.87 - 5.11 MIL/uL   Hemoglobin 13.1  12.0 - 15.0 g/dL   HCT 39.2  36.0 - 46.0 %   MCV 87.7  78.0 - 100.0 fL   MCH 29.3  26.0 - 34.0 pg   MCHC 33.4  30.0 - 36.0 g/dL   RDW 14.5  11.5 - 15.5 %   Platelets 206  150 - 400 K/uL    Ct Abdomen Pelvis Wo Contrast  06/17/2013   CLINICAL DATA:  Abdominal pain, history of colitis  EXAM: CT ABDOMEN AND PELVIS WITHOUT CONTRAST  TECHNIQUE: Multidetector CT imaging of the abdomen and pelvis was performed following the standard protocol without intravenous contrast.  COMPARISON:  None.  FINDINGS: Cardiomegaly is noted. Mitral valve calcifications. There is patchy atelectasis or infiltrate in left lower lobe. Small hiatal hernia. Punctate calcifications within liver probable from prior granulomatous disease. The study is limited without IV contrast. Mild distended gallbladder. Tiny layering gallstones are noted within gallbladder. No pericholecystic fluid. Atherosclerotic calcifications of abdominal aorta and iliac arteries. Atherosclerotic calcifications bilateral renal artery origin and SMA origin. Unenhanced pancreas,  spleen and adrenals are unremarkable. Bilateral kidneys shows a lobulated contour. Mild perinephric nonspecific stranding and fluid. Probable cyst in midpole anterior aspect of the left kidney measures 1.7 cm. No nephrolithiasis. No hydronephrosis or hydroureter. No calcified ureteral calculi. There is infraumbilical midline ventral hernia containing fat measures 3.7 cm.  Multiple colonic diverticula are noted. The terminal ileum is unremarkable. Normal appendix.  There is mild thickening of sigmoid colon wall. Multiple sigmoid colon diverticula are noted. Axial image 63 there  is mild stranding of pericolonic fat and trace pericolonic fluid in proximal sigmoid colon. Findings are highly suspicious for mild diverticulitis. No definite perforation is noted on this unenhanced scan. No diverticular abscess.  The uterus is atrophic. There is a nodular structure with a ring peripheral calcification in left adnexal region measures 2.3 cm. This may represent a calcified ovarian cyst a calcified adnexal fibroid or a dermoid. A distended urinary bladder is noted. No calcified calculi are noted within urinary bladder.  No small bowel obstruction.  No free abdominal air.  No adenopathy.  Sagittal images of the spine shows osteopenia and mild degenerative changes lumbar spine. Probable hemangioma T11 vertebral body.  IMPRESSION: 1. Patchy atelectasis or infiltrate in left lower lobe. 2. Multiple colonic diverticula. Multiple sigmoid colon diverticula. There is mild pericolonic stranding and small amount of pericolonic fluid proximal sigmoid colon in left lower quadrant suspicious for mild diverticulitis. No diverticular abscess. 3. Distended urinary bladder without evidence of calcified calculi. 4. Infraumbilical midline ventral hernia containing fat measures about 3.7 cm. 5. Normal appendix.  No pericecal inflammation. 6. Atherosclerotic vascular calcifications. 7. Mild distended gallbladder.  Tiny layering gallstones. 8.  Calcified lesion in left adnexa measures 2.3 cm. Please see above discussion.   Electronically Signed   By: Lahoma Crocker M.D.   On: 06/17/2013 10:33   Dg Chest Portable 1 View  06/17/2013   CLINICAL DATA:  Abdominal pain, nausea and vomiting  EXAM: PORTABLE CHEST - 1 VIEW  COMPARISON:  07/25/2004  FINDINGS: Cardiomegaly is noted. Central vascular congestion without convincing pulmonary edema. Mild right basilar atelectasis. Hazy left basilar atelectasis or infiltrate. Probable small left pleural effusion.  IMPRESSION: Central vascular congestion without convincing pulmonary edema. Mild right basilar atelectasis. Hazy left basilar atelectasis or infiltrate. Probable small left pleural effusion.   Electronically Signed   By: Lahoma Crocker M.D.   On: 06/17/2013 08:31   Dg Abd Portable 1v  06/17/2013   CLINICAL DATA:  Abdominal pain  EXAM: PORTABLE ABDOMEN - 1 VIEW  COMPARISON:  None.  FINDINGS: Single portable decubitus view of the abdomen reveals no definitive free air. No acute bony abnormality is noted. Degenerative changes of the lumbar spine are seen.   Electronically Signed   By: Inez Catalina M.D.   On: 06/17/2013 08:39    Review of Systems  All other systems reviewed and are negative.   Blood pressure 119/55, pulse 101, temperature 99.6 F (37.6 C), temperature source Oral, resp. rate 25, height _0  (1.702 m), weight 169 lb 4 oz (76.771 kg), SpO2 99.00%. Physical Exam  Constitutional: She appears well-developed and well-nourished. No distress.  Neck: Normal range of motion. Neck supple.  Cardiovascular: Normal rate and intact distal pulses.  Exam reveals no gallop and no friction rub.   Murmur heard. Tachycardic, regularly irregular   Respiratory: Effort normal and breath sounds normal. No respiratory distress. She has no wheezes. She has no rales. She exhibits no tenderness.  GI: Soft. Bowel sounds are normal. She exhibits no distension and no mass. There is no guarding.  Moderate sized  umbilical hernia, easily reducible. Abdomen is soft, diffuse mild tenderness(just had morphine).  +bs, no rebound tenderness, tympany or peritoneal signs.  Musculoskeletal: She exhibits no edema.  Skin: Skin is warm and dry. No rash noted. She is not diaphoretic. No erythema. No pallor.  Psychiatric: She has a normal mood and affect.  Lethargic, difficult to obtain history due to sedation    Assessment/Plan: Abdominal pain Leukocytosis  Mild sigmoid diverticulitis  Elevated troponins  Lactic acidosis  Positive FOBT  Etiology of abdominal pain is unclear at present time. Her pain is out of proportion to her  Recommend a CT angio of abdomen to rule out mesenteric ischemia.  Recommend bowel rest, IV fluids, IV antibiotics, pain control and serial abdominal exam. Agree with SDU admission.  There is no indication for surgical intervention at this time.  Will continue to follow.  Thank you for the consult.     Terrilee Dudzik  ANP--BC  06/17/2013, 1:33 PM

## 2013-06-17 NOTE — Consult Note (Signed)
Sigmoid colitis, possible diverticulitis. Abdomen is not significantly tender on examination.Patient has also been having chest pain on and off for the past week according to her husband. She has mild troponin elevation. Recommend bowel rest, IV fluid resuscitation, IV antibiotics, and CT angiogram of the abdomen and pelvis to evaluate the vasculature to the small and large bowel. There may be an element of bowel ischemia. I spoke with the emergency department physician, Dr. Vanita Panda, and he has been in contact with cardiology. Agree with medical admission. We will follow closely. I discussed the plan in detail the patient and her husband. Patient examined and I agree with the assessment and plan  Georganna Skeans, MD, MPH, FACS Pager: [phone]  06/17/2013 2:54 PM

## 2013-06-17 NOTE — Progress Notes (Signed)
Danielle [name] [id number] Code Status: full   Admission Data: 06/17/2013 7:24 PM Attending Provider:  Tommy Medal ZWC:HENIDPOE,UMPNTIR Jerilynn Mages, MD Consults/ Treatment Team: Treatment Team:  Rounding Lbcardiology, MD Md Ccs, MD  Danielle [name] is a 80 y.o. female patient admitted from ED awake, alert - oriented  X 3 - no acute distress noted.  VSS - Blood pressure 114/45, pulse 95, temperature 98.8 F (37.1 C), temperature source Oral, resp. rate 15, height 5\' 7"  (1.702 m), weight 78.2 kg (172 lb 6.4 oz), SpO2 92.00%.  no c/o shortness of breath, no c/o chest pain. Cardiac tele # [id number], in place, cardiac monitor yields:BBB. O2:   @ 4 l/min per minute. IV Fluids:  IV in place, occlusive dsg intact without redness, IV cath forearm right, condition patent and no redness normal saline.  Allergies:   Allergies  Allergen Reactions  . Thyroid Hormones     proparathyroid thyroid medication     Past Medical History  Diagnosis Date  . Osteoarthritis   . Macular degeneration   . Hypertension   . Thyroid disease   . Thyroid cancer   . GI bleed    Medications Prior to Admission  Medication Sig Dispense Refill  . amLODipine (NORVASC) 5 MG tablet Take 5 mg by mouth daily.        Marland Kitchen levothyroxine (SYNTHROID, LEVOTHROID) 100 MCG tablet Take 100 mcg by mouth daily. Do not take synthroid on Mon and Thursdays      . metoprolol (LOPRESSOR) 50 MG tablet Take 25 mg by mouth daily.        History:  obtained from chart review. Tobacco/alcohol: denied none  Orientation to room, and floor completed with information packet given to patient/family.  Patient declined safety video at this time.  Admission INP armband ID verified with patient/family, and in place.   SR up x 2, fall assessment complete, with patient and family able to verbalize understanding of risk associated with falls, and verbalized understanding to call nsg before up out of bed.  Call light within reach, patient able to voice, and demonstrate  understanding.  Skin, clean-dry- intact without evidence of bruising, or skin tears.   No evidence of skin break down noted on exam.     Will cont to eval and treat per MD orders.  Gracy Bruins, RN 06/17/2013 7:24 PM

## 2013-06-17 NOTE — Progress Notes (Signed)
ANTIBIOTIC CONSULT NOTE   Pharmacy Consult for vancomycin and zosyn Indication: Sepsis  Allergies  Allergen Reactions  . Thyroid Hormones     proparathyroid thyroid medication    Patient Measurements: Height: 5\' 7"  (170.2 cm) Weight: 169 lb 4 oz (76.771 kg) IBW/kg (Calculated) : 61.6   Vital Signs: Temp: 103 F (39.4 C) (02/10 1607) Temp src: Oral (02/10 1607) BP: 128/67 mmHg (02/10 1607) Pulse Rate: 101 (02/10 1100) Intake/Output from previous day:   Intake/Output from this shift:    Labs:  Recent Labs  06/17/13 0845 06/17/13 1300  WBC 19.7* 20.9*  HGB 12.8 13.1  PLT 210 206  CREATININE 0.75  --    Estimated Creatinine Clearance: 59.9 ml/min (by C-G formula based on Cr of 0.75). No results found for this basename: VANCOTROUGH, VANCOPEAK, VANCORANDOM, GENTTROUGH, GENTPEAK, GENTRANDOM, TOBRATROUGH, TOBRAPEAK, TOBRARND, AMIKACINPEAK, AMIKACINTROU, AMIKACIN,  in the last 72 hours   Microbiology: No results found for this or any previous visit (from the past 720 hour(s)).  Medical History: Past Medical History  Diagnosis Date  . Osteoarthritis   . Macular degeneration   . Hypertension   . Thyroid disease   . Thyroid cancer   . GI bleed    Assessment: 80 year old female presents to [facility] with abdominal pain. Originally received cipro/flagyl for colitis/diverticulitis, now with concern for sepsis abx broadened to vancomycin and zosyn. Tmax 103 with increasing wbc count to 20.9, renal function appears normal.   Goal of Therapy:  Vancomycin trough level 15-20 mcg/ml  Plan:  Vancomycin 1500mg  IV x1 then 750mg  q12 hours Zosyn 3.375g IV q8 hours Follow fever curve and white count Follow up blood cultures  Erin Hearing PharmD., BCPS Clinical Pharmacist Pager [phone] 06/17/2013 4:42 PM

## 2013-06-17 NOTE — ED Notes (Signed)
Lactic acid results shown to Dr. Vanita Panda

## 2013-06-17 NOTE — ED Notes (Signed)
Dr. Vanita Panda notified of Troponin 0.45. States to hold Aspirin at this time. Pt denies chest pain at this time but c/o 10/10 abdominal pain, Dr. Vanita Panda made aware.

## 2013-06-18 DIAGNOSIS — R109 Unspecified abdominal pain: Secondary | ICD-10-CM

## 2013-06-18 DIAGNOSIS — I369 Nonrheumatic tricuspid valve disorder, unspecified: Secondary | ICD-10-CM

## 2013-06-18 DIAGNOSIS — K5289 Other specified noninfective gastroenteritis and colitis: Secondary | ICD-10-CM

## 2013-06-18 LAB — TROPONIN I
TROPONIN I: 0.49 ng/mL — AB (ref <0.30)
TROPONIN I: 0.56 ng/mL — AB (ref <0.30)
TROPONIN I: 0.83 ng/mL — AB (ref <0.30)

## 2013-06-18 LAB — COMPREHENSIVE METABOLIC PANEL
ALBUMIN: 2.3 g/dL — AB (ref 3.5–5.2)
ALT: 16 U/L (ref 0–35)
AST: 23 U/L (ref 0–37)
Alkaline Phosphatase: 67 U/L (ref 39–117)
BUN: 12 mg/dL (ref 6–23)
CO2: 19 mEq/L (ref 19–32)
CREATININE: 0.74 mg/dL (ref 0.50–1.10)
Calcium: 7.3 mg/dL — ABNORMAL LOW (ref 8.4–10.5)
Chloride: 104 mEq/L (ref 96–112)
GFR calc Af Amer: >90 mL/min (ref >90)
GFR calc non Af Amer: 78 mL/min — ABNORMAL LOW (ref >90)
Glucose, Bld: 118 mg/dL — ABNORMAL HIGH (ref 70–99)
Potassium: 4.4 mEq/L (ref 3.7–5.3)
Sodium: 137 mEq/L (ref 137–147)
TOTAL PROTEIN: 5.2 g/dL — AB (ref 6.0–8.3)
Total Bilirubin: 1.5 mg/dL — ABNORMAL HIGH (ref 0.3–1.2)

## 2013-06-18 LAB — CBC
HCT: 35.9 % — ABNORMAL LOW (ref 36.0–46.0)
HCT: 37 % (ref 36.0–46.0)
HCT: 36 % (ref 36.0–46.0)
HEMATOCRIT: 34.8 % — AB (ref 36.0–46.0)
HEMATOCRIT: 33.1 % — AB (ref 36.0–46.0)
HEMOGLOBIN: 12.1 g/dL (ref 12.0–15.0)
HEMOGLOBIN: 11 g/dL — AB (ref 12.0–15.0)
Hemoglobin: 12 g/dL (ref 12.0–15.0)
Hemoglobin: 12.3 g/dL (ref 12.0–15.0)
Hemoglobin: 11.6 g/dL — ABNORMAL LOW (ref 12.0–15.0)
MCH: 29.5 pg (ref 26.0–34.0)
MCH: 29.4 pg (ref 26.0–34.0)
MCH: 29.6 pg (ref 26.0–34.0)
MCH: 29.4 pg (ref 26.0–34.0)
MCH: 29.2 pg (ref 26.0–34.0)
MCHC: 33.4 g/dL (ref 30.0–36.0)
MCHC: 33.2 g/dL (ref 30.0–36.0)
MCHC: 33.6 g/dL (ref 30.0–36.0)
MCHC: 33.3 g/dL (ref 30.0–36.0)
MCHC: 33.2 g/dL (ref 30.0–36.0)
MCV: 88.2 fL (ref 78.0–100.0)
MCV: 88.3 fL (ref 78.0–100.0)
MCV: 88 fL (ref 78.0–100.0)
MCV: 88.1 fL (ref 78.0–100.0)
MCV: 87.8 fL (ref 78.0–100.0)
PLATELETS: 189 10*3/uL (ref 150–400)
PLATELETS: 168 10*3/uL (ref 150–400)
PLATELETS: 178 10*3/uL (ref 150–400)
Platelets: 186 10*3/uL (ref 150–400)
Platelets: 187 10*3/uL (ref 150–400)
RBC: 4.07 MIL/uL (ref 3.87–5.11)
RBC: 4.19 MIL/uL (ref 3.87–5.11)
RBC: 4.09 MIL/uL (ref 3.87–5.11)
RBC: 3.95 MIL/uL (ref 3.87–5.11)
RBC: 3.77 MIL/uL — AB (ref 3.87–5.11)
RDW: 15 % (ref 11.5–15.5)
RDW: 15.2 % (ref 11.5–15.5)
RDW: 15.2 % (ref 11.5–15.5)
RDW: 15.4 % (ref 11.5–15.5)
RDW: 15.6 % — ABNORMAL HIGH (ref 11.5–15.5)
WBC: 17.1 10*3/uL — AB (ref 4.0–10.5)
WBC: 16.8 10*3/uL — AB (ref 4.0–10.5)
WBC: 17.3 10*3/uL — ABNORMAL HIGH (ref 4.0–10.5)
WBC: 17.3 10*3/uL — ABNORMAL HIGH (ref 4.0–10.5)
WBC: 17 10*3/uL — ABNORMAL HIGH (ref 4.0–10.5)

## 2013-06-18 LAB — LACTIC ACID, PLASMA: LACTIC ACID, VENOUS: 1.8 mmol/L (ref 0.5–2.2)

## 2013-06-18 MED ORDER — SODIUM CHLORIDE 0.9 % IV SOLN
INTRAVENOUS | Status: DC
  Administered 2013-06-18 – 2013-06-19 (×3): via INTRAVENOUS

## 2013-06-18 MED ORDER — PANTOPRAZOLE SODIUM 40 MG PO TBEC
40.0000 mg | DELAYED_RELEASE_TABLET | Freq: Every day | ORAL | Status: DC
  Administered 2013-06-18 – 2013-06-23 (×6): 40 mg via ORAL
  Filled 2013-06-18 (×7): qty 1

## 2013-06-18 MED ORDER — METRONIDAZOLE IN NACL 5-0.79 MG/ML-% IV SOLN
500.0000 mg | Freq: Once | INTRAVENOUS | Status: AC
  Administered 2013-06-18: 500 mg via INTRAVENOUS
  Filled 2013-06-18: qty 100

## 2013-06-18 MED ORDER — DEXTROSE 5 % IV SOLN
2.0000 g | Freq: Two times a day (BID) | INTRAVENOUS | Status: DC
  Administered 2013-06-18 – 2013-06-22 (×10): 2 g via INTRAVENOUS
  Filled 2013-06-18 (×13): qty 2

## 2013-06-18 NOTE — Progress Notes (Signed)
Md made aware of Troponin 0.49 this is consistant with previous value. The MD was also made aware that the pt has had 2 lg frank red blood stools with clots. Will continue to monitor pt.

## 2013-06-18 NOTE — Progress Notes (Signed)
Subjective: Continued Abd pain.  No CP.  Objective: Vital signs in last 24 hours: Temp:  [98.8 F (37.1 C)-103 F (39.4 C)] 99.9 F (37.7 C) (02/11 0738) Pulse Rate:  [89-114] 92 (02/11 0738) Resp:  [15-35] 35 (02/11 0738) BP: (95-129)/(41-67) 129/55 mmHg (02/11 0738) SpO2:  [89 %-98 %] 97 % (02/11 0738) Weight:  [169 lb 8.5 oz (76.9 kg)-172 lb 6.4 oz (78.2 kg)] 169 lb 8.5 oz (76.9 kg) (02/11 0449)    Intake/Output from previous day: 02/10 0701 - 02/11 0700 In: 3005.3 [I.V.:767.5; IV Piggyback:2237.8] Out: 1575 [Urine:1575] Intake/Output this shift:    Medications Current Facility-Administered Medications  Medication Dose Route Frequency Provider Last Rate Last Dose  . 0.9 %  sodium chloride infusion   Intravenous Continuous Emina Riebock, NP 100 mL/hr at 06/18/13 0905    . acetaminophen (TYLENOL) suppository 325 mg  325 mg Rectal Q8H PRN Jessee Avers, MD   325 mg at 06/17/13 1618  . ceFEPIme (MAXIPIME) 2 g in dextrose 5 % 50 mL IVPB  2 g Intravenous Q12H Kimberly Ballard Hammons, RPH   2 g at 06/18/13 1027  . levothyroxine (SYNTHROID, LEVOTHROID) injection 50 mcg  50 mcg Intravenous Daily Othella Boyer, MD   50 mcg at 06/18/13 1028  . metroNIDAZOLE (FLAGYL) IVPB 500 mg  500 mg Intravenous Once Jessee Avers, MD   500 mg at 06/18/13 1027  . morphine 2 MG/ML injection 2-4 mg  2-4 mg Intravenous Q4H PRN Othella Boyer, MD   2 mg at 06/18/13 0901  . pantoprazole (PROTONIX) EC tablet 40 mg  40 mg Oral Daily Jessee Avers, MD   40 mg at 06/18/13 1027  . promethazine (PHENERGAN) injection 12.5 mg  12.5 mg Intravenous Q6H PRN Joni Reining, DO   12.5 mg at 06/18/13 1046  . sodium chloride 0.9 % injection 3 mL  3 mL Intravenous Q12H Othella Boyer, MD   3 mL at 06/18/13 1027    PE: General appearance: alert, cooperative and no distress Lungs: clear to auscultation bilaterally Heart: regular rate and rhythm, S1, S2 normal, no murmur, click, rub or gallop Abdomen: +BS  tender LLQ Extremities: No LEE Pulses: 2+ and symmetric Neurologic: Grossly normal  Lab Results:   Recent Labs  06/18/13 0222 06/18/13 0540 06/18/13 1032  WBC 17.1* 16.8* 17.3*  HGB 12.0 12.3 12.1  HCT 35.9* 37.0 36.0  PLT 186 189 168   BMET  Recent Labs  06/17/13 0845 06/18/13 0222  NA 142 137  K 3.0* 4.4  CL 105 104  CO2 24 19  GLUCOSE 123* 118*  BUN 13 12  CREATININE 0.75 0.74  CALCIUM 7.4* 7.3*   PT/INR No results found for this basename: LABPROT, INR,  in the last 72 hours Cholesterol No results found for this basename: CHOL,  in the last 72 hours Cardiac Enzymes Cardiac Panel (last 3 results)  Recent Labs  06/17/13 1058 06/17/13 1659 06/18/13 0222  TROPONINI 0.45* 0.51* 0.49*    Assessment/Plan   Principal Problem:   Diverticulitis of colon Active Problems:   Hypertension   Thyroid disease   Osteoarthritis   Hypokalemia   Elevated troponin   New left bundle branch block   Asymptomatic bacteriuria  Plan:  The pt's granddaughter reported, and the patient confirm, a severe episode of CP in the last few months when washing some clothes.  Troponin peaked at 0.51.  New LBBB.  Echo completed today and pending read.  Will review when done.  BP and HR stable.  Lexiscan myoview when current situation resolves.    LOS: 1 day    HAGER, BRYAN 06/18/2013 11:19 AM   Agree with note written by Luisa Dago PAC  Sx sound GI prob related to diverticulosis/itis. Doubt ACS. Hard to know how long she has had a LBBB and low level troponins in the setting of acute illness not diagnostic. She has had exertional CP as an OP and will probably need a functional study once her GI issues have resolved. Will follow with you and arrange.  Lorretta Harp 06/18/2013 6:32 PM

## 2013-06-18 NOTE — Clinical Documentation Improvement (Signed)
THIS DOCUMENT IS NOT A PERMANENT PART OF THE MEDICAL RECORD  Please update your documentation with the medical record to reflect your response to this query. If you need help knowing how to do this please call [phone].  06/18/13   Dear Dr.Van Dam / Associates,  In a better effort to capture your patient's severity of illness, reflect appropriate length of stay and utilization of resources, a review of the patient medical record has revealed the following indicators.    Based on your clinical judgment, please clarify and document in a progress note and/or discharge summary the clinical condition associated with the following supporting information:  In responding to this query please exercise your independent judgment.  The fact that a query is asked, does not imply that any particular answer is desired or expected.   Possible Clinical Conditions?  "        UTI   "        Other Condition  "        Cannot Clinically Determine     Risk Factors: UA suggests infection, noted per 02/10 progress notes.  Diagnostics: 02/10:  Urine micro: abnormal 02/10:  U/A:  abnormal.   You may use possible, probable, or suspect with inpatient documentation. possible, probable, suspected diagnoses MUST be documented at the time of discharge  Reviewed:   Thank You,  Theron Arista, Clinical Documentation Specialist: [phone] Health Information Management [place]  I CANNOT DETERMINE IF PT TRULY HAD UTI, NO CULTURE WAS DONE. THIS IN GENERAL IS A WAY-OVERDIAGNOSED CONDITION

## 2013-06-18 NOTE — Progress Notes (Signed)
Subjective: Complains of abdominal pain when she moves.  Overall her pain is better when compared to yesterday.  She complains of nausea.  According to nursing she had 1 episode of BRBPR, followed by 1 dark bloody BM  Objective: Vital signs in last 24 hours: Temp:  [98.8 F (37.1 C)-103 F (39.4 C)] 99.9 F (37.7 C) (02/11 0738) Pulse Rate:  [88-114] 93 (02/11 0413) Resp:  [15-34] 33 (02/11 0413) BP: (95-129)/(41-67) 129/55 mmHg (02/11 0738) SpO2:  [89 %-100 %] 97 % (02/11 0413) Weight:  [169 lb 8.5 oz (76.9 kg)-172 lb 6.4 oz (78.2 kg)] 169 lb 8.5 oz (76.9 kg) (02/11 0449)    Intake/Output from previous day: 02/10 0701 - 02/11 0700 In: 3005.3 [I.V.:767.5; IV Piggyback:2237.8] Out: 1575 [Urine:1575] Intake/Output this shift:   PE General appearance: alert, cooperative and no distress Resp: clear to auscultation bilaterally Cardio: S1S2 RRR, 2/6SEM. GI: +bs, abdomen is soft.  she exhibits tenderness to RLQ and LLQ.  no guarding or evidence of peritonitis.    Lab Results:   Recent Labs  06/18/13 0222 06/18/13 0540  WBC 17.1* 16.8*  HGB 12.0 12.3  HCT 35.9* 37.0  PLT 186 189   BMET  Recent Labs  06/17/13 0845 06/18/13 0222  NA 142 137  K 3.0* 4.4  CL 105 104  CO2 24 19  GLUCOSE 123* 118*  BUN 13 12  CREATININE 0.75 0.74  CALCIUM 7.4* 7.3*   PT/INR No results found for this basename: LABPROT, INR,  in the last 72 hours ABG No results found for this basename: PHART, PCO2, PO2, HCO3,  in the last 72 hours  Studies/Results: Ct Abdomen Pelvis Wo Contrast  06/17/2013   CLINICAL DATA:  Abdominal pain, history of colitis  EXAM: CT ABDOMEN AND PELVIS WITHOUT CONTRAST  TECHNIQUE: Multidetector CT imaging of the abdomen and pelvis was performed following the standard protocol without intravenous contrast.  COMPARISON:  None.  FINDINGS: Cardiomegaly is noted. Mitral valve calcifications. There is patchy atelectasis or infiltrate in left lower lobe. Small hiatal  hernia. Punctate calcifications within liver probable from prior granulomatous disease. The study is limited without IV contrast. Mild distended gallbladder. Tiny layering gallstones are noted within gallbladder. No pericholecystic fluid. Atherosclerotic calcifications of abdominal aorta and iliac arteries. Atherosclerotic calcifications bilateral renal artery origin and SMA origin. Unenhanced pancreas, spleen and adrenals are unremarkable. Bilateral kidneys shows a lobulated contour. Mild perinephric nonspecific stranding and fluid. Probable cyst in midpole anterior aspect of the left kidney measures 1.7 cm. No nephrolithiasis. No hydronephrosis or hydroureter. No calcified ureteral calculi. There is infraumbilical midline ventral hernia containing fat measures 3.7 cm.  Multiple colonic diverticula are noted. The terminal ileum is unremarkable. Normal appendix.  There is mild thickening of sigmoid colon wall. Multiple sigmoid colon diverticula are noted. Axial image 63 there is mild stranding of pericolonic fat and trace pericolonic fluid in proximal sigmoid colon. Findings are highly suspicious for mild diverticulitis. No definite perforation is noted on this unenhanced scan. No diverticular abscess.  The uterus is atrophic. There is a nodular structure with a ring peripheral calcification in left adnexal region measures 2.3 cm. This may represent a calcified ovarian cyst a calcified adnexal fibroid or a dermoid. A distended urinary bladder is noted. No calcified calculi are noted within urinary bladder.  No small bowel obstruction.  No free abdominal air.  No adenopathy.  Sagittal images of the spine shows osteopenia and mild degenerative changes lumbar spine. Probable hemangioma T11 vertebral body.  IMPRESSION:  1. Patchy atelectasis or infiltrate in left lower lobe. 2. Multiple colonic diverticula. Multiple sigmoid colon diverticula. There is mild pericolonic stranding and small amount of pericolonic fluid  proximal sigmoid colon in left lower quadrant suspicious for mild diverticulitis. No diverticular abscess. 3. Distended urinary bladder without evidence of calcified calculi. 4. Infraumbilical midline ventral hernia containing fat measures about 3.7 cm. 5. Normal appendix.  No pericecal inflammation. 6. Atherosclerotic vascular calcifications. 7. Mild distended gallbladder.  Tiny layering gallstones. 8. Calcified lesion in left adnexa measures 2.3 cm. Please see above discussion.   Electronically Signed   By: Lahoma Crocker M.D.   On: 06/17/2013 10:33   Dg Chest Portable 1 View  06/17/2013   CLINICAL DATA:  Abdominal pain, nausea and vomiting  EXAM: PORTABLE CHEST - 1 VIEW  COMPARISON:  07/25/2004  FINDINGS: Cardiomegaly is noted. Central vascular congestion without convincing pulmonary edema. Mild right basilar atelectasis. Hazy left basilar atelectasis or infiltrate. Probable small left pleural effusion.  IMPRESSION: Central vascular congestion without convincing pulmonary edema. Mild right basilar atelectasis. Hazy left basilar atelectasis or infiltrate. Probable small left pleural effusion.   Electronically Signed   By: Lahoma Crocker M.D.   On: 06/17/2013 08:31   Dg Abd Portable 1v  06/17/2013   CLINICAL DATA:  Abdominal pain  EXAM: PORTABLE ABDOMEN - 1 VIEW  COMPARISON:  None.  FINDINGS: Single portable decubitus view of the abdomen reveals no definitive free air. No acute bony abnormality is noted. Degenerative changes of the lumbar spine are seen.   Electronically Signed   By: Inez Catalina M.D.   On: 06/17/2013 08:39   Ct Angio Abd/pel W/ And/or W/o  06/17/2013   CLINICAL DATA:  GI bleed of uncertain etiology, questionable bleeding ulcer  EXAM: CT ANGIOGRAPHY ABDOMEN AND PELVIS WITH CONTRAST  TECHNIQUE: Multidetector CT imaging of the abdomen and pelvis was performed using the standard protocol during bolus administration of intravenous contrast. Multiplanar reconstructed images and MIPs were obtained and  reviewed to evaluate the vascular anatomy.  CONTRAST:  177mL OMNIPAQUE IOHEXOL 350 MG/ML SOLN  COMPARISON:  CT abdomen pelvis-earlier same day  FINDINGS: Vascular Findings:  Abdominal aorta: Rather extensive amounts of mixed noncalcified though predominantly calcified atherosclerotic plaque within a normal caliber abdominal aorta. No abdominal aortic dissection or periaortic stranding.  Celiac artery: There is a mixture of calcified and noncalcified atherosclerotic plaque involving in the origin of the celiac artery, not resulting in hemodynamically significant stenosis. Conventional branching pattern.  SMA: There is a mixture of calcified and noncalcified atherosclerotic plaque involving the origin of the SMA, not result in hemodynamically significant stenosis. A replaced right hepatic artery is incidentally noted to arise from the proximal aspect of the SMA.  Right Renal artery: Solitary; there is a minimal amount of eccentric mixed calcified and noncalcified atherosclerotic plaque involving the cranial aspect of the origin of the right renal artery, not resulting in hemodynamically significant stenosis  Left Renal artery: Solitary; there is a mixture of calcified and noncalcified atherosclerotic plaque involving the cranial aspect of the origin and proximal aspect of the left renal artery, not result in hemodynamically significant stenosis  IMA: Widely patent.  Pelvic vasculature: There is mixed calcified and noncalcified atherosclerotic plaque involving the bilateral common and internal iliac artery is, not resulting in hemodynamically significant stenosis. The bilateral external iliac arteries are widely patent and of normal caliber.  Review of the MIP images confirms the above findings.   --------------------------------------------------------------------------------  Nonvascular Findings:  Normal hepatic contour.  There is an approximately 1.0 cm hypo attenuating (10 Hounsfield unit) cyst within the lateral  segment of the left lobe of the liver (image 31, series 4). A punctate granuloma is noted within in the medial segment of the left lobe of the liver (image 31), likely the sequela of prior granulomatous infection. Several layering radiopaque gallstones are seen within an otherwise normal-appearing gallbladder. No intra or extrahepatic biliary duct dilatation. No ascites.  There is symmetric enhancement and excretion of the bilateral kidneys. No definite renal stones on this postcontrast examination. Note is made of a partially exophytic approximately 2.2 cm hypo attenuating (4 Hounsfield unit) left-sided renal cyst (image 58, series 4). No discrete right-sided renal lesions. There is a minimal amount of grossly symmetric likely age-related perinephric stranding. No urinary obstruction. Normal appearance of the bilateral adrenal glands, pancreas and spleen.  Redemonstrated extensive colonic diverticulosis. There is mesenteric stranding adjacent to the sigmoid colon within the left lower abdominal quadrant (representative axial images 89, 97, 98, series 4) worrisome for acute uncomplicated diverticulitis. No evidence of perforation or drainable fluid collection.  There is mild rather diffuse bowel wall thickening involving the distal small bowel (representative axial images 69, 97, 109, 118 and 121), not resulting in enteric obstruction. No pneumoperitoneum, pneumatosis or portal venous gas. Normal appearance of the appendix.  Scattered shoddy portahepatis and retroperitoneal lymph nodes individually not enlarged by size criteria. No retroperitoneal, mesenteric, pelvic or inguinal lymph adenopathy.  A Foley catheter is seen within the decompressed urinary bladder. Adjacent to the posterior aspect of the sigmoid colon within the left hemipelvis is an approximately 2.5 x 1.5 cm nodular apparent soft tissue structure (image 116, series 4). This structure is immediately anterior to the previously identified approximately  2.9 cm peripherally calcified left adnexal structure (image 118, series 4). Otherwise, normal appearance of the pelvic organs. No free fluid within the pelvis.  Limited visualization of lower thorax rib demonstrates bilateral lower lung heterogeneous and consolidative opacities. No pleural effusion.  Cardiomegaly. Calcifications in the mitral valve annulus. No pericardial effusion.  There is mild-to-moderate diffuse trabecular thickening involving the right ilium suggestive of Paget's disease. A bone island is incidentally noted within the left ilium. An intraosseous hemangioma is incidentally noted within the T11 vertebral body. Mesenteric fat containing periumbilical hernia.  IMPRESSION: 1. Large amount of predominantly calcified atherosclerotic plaque within normal caliber abdominal aorta, not resulting in hemodynamically significant stenosis. No abdominal aortic dissection or evidence of mesenteric ischemia. 2. Findings again suggestive of acute uncomplicated diverticulitis involving the sigmoid colon within the left hemipelvis. No definite evidence of perforation or definable/drainable fluid collection. 3. Nonspecific mild though rather diffuse apparent bowel wall thickening involving the distal small bowel, not resulting in enteric obstruction - while possibly reactive secondary to ongoing acute diverticulitis, a secondary enteritis may have a similar appearance. Clinical correlation is advised. 4. Indeterminate approximately 2.5 cm nodular soft tissue structure adjacent to the posterior wall of the sigmoid colon within the left hemipelvis. This structure is indeterminate with differential considerations including an enlarged possibly reactive left pelvic sidewall lymph node, a focally dilated left-sided gonadal vein or possibly an underdistended enlarged diverticulum. 5. Unchanged peripherally calcifying approximately 2.9 cm left-sided adnexal lesion nonspecific though possibly representative of a partially  calcified ovarian dermoid. 6. Suspected Paget's disease involving the right ilium.   Electronically Signed   By: Sandi Mariscal M.D.   On: 06/17/2013 16:36    Anti-infectives: Anti-infectives   Start     Dose/Rate Route Frequency  Ordered Stop   06/18/13 1000  ceFEPIme (MAXIPIME) 2 g in dextrose 5 % 50 mL IVPB     2 g 100 mL/hr over 30 Minutes Intravenous Every 12 hours 06/18/13 0831     06/18/13 0900  metroNIDAZOLE (FLAGYL) IVPB 500 mg     500 mg 100 mL/hr over 60 Minutes Intravenous  Once 06/18/13 0757     06/18/13 0800  vancomycin (VANCOCIN) IVPB 750 mg/150 ml premix  Status:  Discontinued     750 mg 150 mL/hr over 60 Minutes Intravenous Every 12 hours 06/17/13 1637 06/18/13 0757   06/17/13 2300  ciprofloxacin (CIPRO) IVPB 400 mg  Status:  Discontinued     400 mg 200 mL/hr over 60 Minutes Intravenous Every 12 hours 06/17/13 1852 06/17/13 1853   06/17/13 2000  metroNIDAZOLE (FLAGYL) IVPB 500 mg  Status:  Discontinued     500 mg 100 mL/hr over 60 Minutes Intravenous Every 8 hours 06/17/13 1852 06/17/13 1853   06/17/13 1730  vancomycin (VANCOCIN) 1,500 mg in sodium chloride 0.9 % 500 mL IVPB     1,500 mg 250 mL/hr over 120 Minutes Intravenous  Once 06/17/13 1637 06/17/13 2114   06/17/13 1700  piperacillin-tazobactam (ZOSYN) IVPB 3.375 g  Status:  Discontinued     3.375 g 12.5 mL/hr over 240 Minutes Intravenous 3 times per day 06/17/13 1635 06/18/13 0757   06/17/13 1100  ciprofloxacin (CIPRO) IVPB 400 mg     400 mg 200 mL/hr over 60 Minutes Intravenous  Once 06/17/13 1046 06/17/13 1315   06/17/13 1100  metroNIDAZOLE (FLAGYL) IVPB 500 mg     500 mg 100 mL/hr over 60 Minutes Intravenous  Once 06/17/13 1046 06/17/13 1544      Assessment/Plan: Abdominal pain  Leukocytosis  sigmoid colitis, possible diverticulitis Elevated troponins  Lactic acidosis  Positive FOBT Hx LGI bleed 2012  CTA of abdomen negative for mesenteric ischemia, no perforation.  She has a non surgical abdomen.   Continue bowel rest, IV hydration, IV antibiotics and pain control.  Consider a GI consultation   LOS: 1 day    Dustyn Armbrister ANP-BC 06/18/2013 9:06 AM

## 2013-06-18 NOTE — Clinical Documentation Improvement (Signed)
THIS DOCUMENT IS NOT A PERMANENT PART OF THE MEDICAL RECORD  Please update your documentation with the medical record to reflect your response to this query. If you need help knowing how to do this please call [phone].  06/18/13  Dear Dr. Tommy Medal Rolley Sims,  In a better effort to capture your patient's severity of illness, reflect appropriate length of stay and utilization of resources, a review of the patient medical record has revealed the following indicators.    Based on your clinical judgment, please clarify and document in a progress note and/or discharge summary the clinical condition associated with the following supporting information:  In responding to this query please exercise your independent judgment.  The fact that a query is asked, does not imply that any particular answer is desired or expected.    Possible Clinical Conditions?  SEVERE SEPSIS WITH SEPTIC SHOCK  Septicemia / Sepsis Severe Sepsis SIRS Septic Shock Sepsis with UTI Sepsis due to an internal device (  ) Bacterial infection of unknown etiology / source Other Condition  Cannot clinically Determine    Treatment: Originally received Cipro/Flagyl for colits/diverticulitis.  Now with a concern for Sepsis, antibiotics broadened to Vanc/Zosyn, Tmax:103, WBC: 20.9, noted per Pharmacy consult of 06/17/13.  Reviewed: additional documentation in the medical record  Thank You,  Theron Arista, Clinical Documentation Specialist: [place]

## 2013-06-18 NOTE — Progress Notes (Signed)
ANTIBIOTIC CONSULT NOTE - FOLLOW UP  Pharmacy Consult for Cefepime Indication: Sepsis  Allergies  Allergen Reactions  . Thyroid Hormones     proparathyroid thyroid medication    Patient Measurements: Height: 5\' 7"  (170.2 cm) Weight: 169 lb 8.5 oz (76.9 kg) IBW/kg (Calculated) : 61.6  Vital Signs: Temp: 99.9 F (37.7 C) (02/11 0738) Temp src: Oral (02/11 0738) BP: 121/55 mmHg (02/11 0413) Pulse Rate: 93 (02/11 0413) Intake/Output from previous day: 02/10 0701 - 02/11 0700 In: 3005.3 [I.V.:767.5; IV Piggyback:2237.8] Out: 1575 [Urine:1575] Intake/Output from this shift:    Labs:  Recent Labs  06/17/13 0845 06/17/13 1300 06/18/13 0222 06/18/13 0540  WBC 19.7* 20.9* 17.1* 16.8*  HGB 12.8 13.1 12.0 12.3  PLT 210 206 186 189  CREATININE 0.75  --  0.74  --    Estimated Creatinine Clearance: 59.9 ml/min (by C-G formula based on Cr of 0.74).    Microbiology: Recent Results (from the past 720 hour(s))  MRSA PCR SCREENING     Status: None   Collection Time    06/17/13  6:53 PM      Result Value Ref Range Status   MRSA by PCR NEGATIVE  NEGATIVE Final   Comment:            The GeneXpert MRSA Assay (FDA     approved for NASAL specimens     only), is one component of a     comprehensive MRSA colonization     surveillance program. It is not     intended to diagnose MRSA     infection nor to guide or     monitor treatment for     MRSA infections.    Anti-infectives   Start     Dose/Rate Route Frequency Ordered Stop   06/18/13 0900  metroNIDAZOLE (FLAGYL) IVPB 500 mg     500 mg 100 mL/hr over 60 Minutes Intravenous  Once 06/18/13 0757     06/18/13 0800  vancomycin (VANCOCIN) IVPB 750 mg/150 ml premix  Status:  Discontinued     750 mg 150 mL/hr over 60 Minutes Intravenous Every 12 hours 06/17/13 1637 06/18/13 0757   06/17/13 2300  ciprofloxacin (CIPRO) IVPB 400 mg  Status:  Discontinued     400 mg 200 mL/hr over 60 Minutes Intravenous Every 12 hours 06/17/13  1852 06/17/13 1853   06/17/13 2000  metroNIDAZOLE (FLAGYL) IVPB 500 mg  Status:  Discontinued     500 mg 100 mL/hr over 60 Minutes Intravenous Every 8 hours 06/17/13 1852 06/17/13 1853   06/17/13 1730  vancomycin (VANCOCIN) 1,500 mg in sodium chloride 0.9 % 500 mL IVPB     1,500 mg 250 mL/hr over 120 Minutes Intravenous  Once 06/17/13 1637 06/17/13 2114   06/17/13 1700  piperacillin-tazobactam (ZOSYN) IVPB 3.375 g  Status:  Discontinued     3.375 g 12.5 mL/hr over 240 Minutes Intravenous 3 times per day 06/17/13 1635 06/18/13 0757   06/17/13 1100  ciprofloxacin (CIPRO) IVPB 400 mg     400 mg 200 mL/hr over 60 Minutes Intravenous  Once 06/17/13 1046 06/17/13 1315   06/17/13 1100  metroNIDAZOLE (FLAGYL) IVPB 500 mg     500 mg 100 mL/hr over 60 Minutes Intravenous  Once 06/17/13 1046 06/17/13 1544      Assessment: 80 yo F who presented to the ED 2/10 with abdominal pain.  Pt was initially started on Vancomycin and Zosyn now with plans to change antibiotics to Cefepime. Pt is currently afebrile and  WBC showing slight trend down.  Goal of Therapy:  Renal dose adjustment of medications; Eradication of infection  Plan:  Cefepime 2gm IV q12h   [employer], Pharm.D., BCPS Clinical Pharmacist Pager [phone] 06/18/2013 8:30 AM

## 2013-06-18 NOTE — H&P (Signed)
  Date: 06/18/2013  Patient name: Danielle [name]  Medical record number: [id number]  Date of birth: [birth year]   I have seen and evaluated Danielle [name] and discussed their care with the Residency Team.   Assessment and Plan: I have seen and evaluated the patient as outlined above. I agree with the formulated Assessment and Plan as detailed in the residents' admission note, with the following changes:   80 year old with admission for severe abdominal pain found to have mild diverticulitis on CT of the abdomen and pelvis also with positive troponins and a new left bundle branch block. This morning apparently she had some bright red blood per rectum. She has had this before with another episode of a diverticular bleed I suspect she may have a mild bleed and now as well.  We were cvering her broadly given her soft blood pressures and temperature overnight that caused further anxiety.   We will narrow to cefepime and flagyl for intrabdominal pathogens a regimen that will providen anti-pseudomonal coverage (that she might not necessarily need) plus most aerobic and and anerobic (with flagyl) pathogens with only exceptions being ESBL, and enterococcus--neither of which seem to be likely pathogens here).    She does not need IV PPI and will change to oral ppi. I dont think there is the evidence for an upper GI bleed.  Greatly appreciate general surgery and cardiology's critical assistance with this patient. Truman Hayward, [place] 2/11/20151:36 PM

## 2013-06-18 NOTE — Progress Notes (Signed)
Subjective: Patient reports abdominal pain improved since yesterday, denies any current chest pain.  Still has mild nausea.  Had 1 episdoe of bloody BM overnight. Objective: Vital signs in last 24 hours: Filed Vitals:   06/18/13 0738 06/18/13 1200 06/18/13 1500 06/18/13 1508  BP: 129/55   116/52  Pulse: 92   85  Temp: 99.9 F (37.7 C) 97.9 F (36.6 C) 98 F (36.7 C) 98 F (36.7 C)  TempSrc: Oral Oral Oral Oral  Resp: 35   35  Height:      Weight:      SpO2: 97%   92%   Weight change:   Intake/Output Summary (Last 24 hours) at 06/18/13 1620 Last data filed at 06/18/13 1600  Gross per 24 hour  Intake 3696.95 ml  Output   1900 ml  Net 1796.95 ml   General: resting in bed HEENT: PERRL, EOMI, no scleral icterus Cardiac: RRR, 2/6 systolic murmur Pulm: clear to auscultation bilaterally Abd: soft, Moderate TTP in RLQ, LLQ, nondistended, BS present Ext: warm and well perfused, no pedal edema Neuro: alert and oriented X3, cranial nerves II-XII grossly intact  Lab Results: Basic Metabolic Panel:  Recent Labs Lab 06/17/13 0845 06/17/13 1659 06/18/13 0222  NA 142  --  137  K 3.0*  --  4.4  CL 105  --  104  CO2 24  --  19  GLUCOSE 123*  --  118*  BUN 13  --  12  CREATININE 0.75  --  0.74  CALCIUM 7.4*  --  7.3*  MG  --  1.3*  --    Liver Function Tests:  Recent Labs Lab 06/17/13 0845 06/18/13 0222  AST 20 23  ALT 18 16  ALKPHOS 72 67  BILITOT 1.0 1.5*  PROT 5.8* 5.2*  ALBUMIN 2.8* 2.3*    Recent Labs Lab 06/17/13 0845  LIPASE 26   No results found for this basename: AMMONIA,  in the last 168 hours CBC:  Recent Labs Lab 06/17/13 0845  06/18/13 0540 06/18/13 1032  WBC 19.7*  < > 16.8* 17.3*  NEUTROABS 17.2*  --   --   --   HGB 12.8  < > 12.3 12.1  HCT 38.5  < > 37.0 36.0  MCV 87.3  < > 88.3 88.0  PLT 210  < > 189 168  < > = values in this interval not displayed. Cardiac Enzymes:  Recent Labs Lab 06/17/13 1659 06/18/13 0222  06/18/13 1020  TROPONINI 0.51* 0.49* 0.56*   Urinalysis:  Recent Labs Lab 06/17/13 0935  COLORURINE YELLOW  LABSPEC 1.013  PHURINE 5.5  GLUCOSEU NEGATIVE  HGBUR TRACE*  BILIRUBINUR NEGATIVE  KETONESUR 15*  PROTEINUR NEGATIVE  UROBILINOGEN 0.2  NITRITE NEGATIVE  LEUKOCYTESUR TRACE*    Micro Results: Recent Results (from the past 240 hour(s))  CULTURE, BLOOD (ROUTINE X 2)     Status: None   Collection Time    06/17/13  4:45 PM      Result Value Ref Range Status   Specimen Description BLOOD RIGHT HAND   Final   Special Requests BOTTLES DRAWN AEROBIC AND ANAEROBIC 10CC   Final   Culture  Setup Time     Final   Value: 06/17/2013 22:11     Performed at [facility]   Culture     Final   Value:        BLOOD CULTURE RECEIVED NO GROWTH TO DATE CULTURE WILL BE HELD FOR 5 DAYS BEFORE ISSUING  A FINAL NEGATIVE REPORT     Performed at [facility]   Report Status PENDING   Incomplete  CULTURE, BLOOD (ROUTINE X 2)     Status: None   Collection Time    06/17/13  4:54 PM      Result Value Ref Range Status   Specimen Description BLOOD LEFT HAND   Final   Special Requests BOTTLES DRAWN AEROBIC ONLY 5CC   Final   Culture  Setup Time     Final   Value: 06/17/2013 22:11     Performed at [facility]   Culture     Final   Value:        BLOOD CULTURE RECEIVED NO GROWTH TO DATE CULTURE WILL BE HELD FOR 5 DAYS BEFORE ISSUING A FINAL NEGATIVE REPORT     Performed at [facility]   Report Status PENDING   Incomplete  MRSA PCR SCREENING     Status: None   Collection Time    06/17/13  6:53 PM      Result Value Ref Range Status   MRSA by PCR NEGATIVE  NEGATIVE Final   Comment:            The GeneXpert MRSA Assay (FDA     approved for NASAL specimens     only), is one component of a     comprehensive MRSA colonization     surveillance program. It is not     intended to diagnose MRSA     infection nor to guide or     monitor treatment for     MRSA  infections.   Studies/Results: Ct Abdomen Pelvis Wo Contrast  06/17/2013   CLINICAL DATA:  Abdominal pain, history of colitis  EXAM: CT ABDOMEN AND PELVIS WITHOUT CONTRAST  TECHNIQUE: Multidetector CT imaging of the abdomen and pelvis was performed following the standard protocol without intravenous contrast.  COMPARISON:  None.  FINDINGS: Cardiomegaly is noted. Mitral valve calcifications. There is patchy atelectasis or infiltrate in left lower lobe. Small hiatal hernia. Punctate calcifications within liver probable from prior granulomatous disease. The study is limited without IV contrast. Mild distended gallbladder. Tiny layering gallstones are noted within gallbladder. No pericholecystic fluid. Atherosclerotic calcifications of abdominal aorta and iliac arteries. Atherosclerotic calcifications bilateral renal artery origin and SMA origin. Unenhanced pancreas, spleen and adrenals are unremarkable. Bilateral kidneys shows a lobulated contour. Mild perinephric nonspecific stranding and fluid. Probable cyst in midpole anterior aspect of the left kidney measures 1.7 cm. No nephrolithiasis. No hydronephrosis or hydroureter. No calcified ureteral calculi. There is infraumbilical midline ventral hernia containing fat measures 3.7 cm.  Multiple colonic diverticula are noted. The terminal ileum is unremarkable. Normal appendix.  There is mild thickening of sigmoid colon wall. Multiple sigmoid colon diverticula are noted. Axial image 63 there is mild stranding of pericolonic fat and trace pericolonic fluid in proximal sigmoid colon. Findings are highly suspicious for mild diverticulitis. No definite perforation is noted on this unenhanced scan. No diverticular abscess.  The uterus is atrophic. There is a nodular structure with a ring peripheral calcification in left adnexal region measures 2.3 cm. This may represent a calcified ovarian cyst a calcified adnexal fibroid or a dermoid. A distended urinary bladder is noted.  No calcified calculi are noted within urinary bladder.  No small bowel obstruction.  No free abdominal air.  No adenopathy.  Sagittal images of the spine shows osteopenia and mild degenerative changes lumbar spine. Probable hemangioma T11 vertebral body.  IMPRESSION: 1. Patchy atelectasis or infiltrate in left lower lobe. 2. Multiple colonic diverticula. Multiple sigmoid colon diverticula. There is mild pericolonic stranding and small amount of pericolonic fluid proximal sigmoid colon in left lower quadrant suspicious for mild diverticulitis. No diverticular abscess. 3. Distended urinary bladder without evidence of calcified calculi. 4. Infraumbilical midline ventral hernia containing fat measures about 3.7 cm. 5. Normal appendix.  No pericecal inflammation. 6. Atherosclerotic vascular calcifications. 7. Mild distended gallbladder.  Tiny layering gallstones. 8. Calcified lesion in left adnexa measures 2.3 cm. Please see above discussion.   Electronically Signed   By: Lahoma Crocker M.D.   On: 06/17/2013 10:33   Dg Chest Portable 1 View  06/17/2013   CLINICAL DATA:  Abdominal pain, nausea and vomiting  EXAM: PORTABLE CHEST - 1 VIEW  COMPARISON:  07/25/2004  FINDINGS: Cardiomegaly is noted. Central vascular congestion without convincing pulmonary edema. Mild right basilar atelectasis. Hazy left basilar atelectasis or infiltrate. Probable small left pleural effusion.  IMPRESSION: Central vascular congestion without convincing pulmonary edema. Mild right basilar atelectasis. Hazy left basilar atelectasis or infiltrate. Probable small left pleural effusion.   Electronically Signed   By: Lahoma Crocker M.D.   On: 06/17/2013 08:31   Dg Abd Portable 1v  06/17/2013   CLINICAL DATA:  Abdominal pain  EXAM: PORTABLE ABDOMEN - 1 VIEW  COMPARISON:  None.  FINDINGS: Single portable decubitus view of the abdomen reveals no definitive free air. No acute bony abnormality is noted. Degenerative changes of the lumbar spine are seen.    Electronically Signed   By: Inez Catalina M.D.   On: 06/17/2013 08:39   Ct Angio Abd/pel W/ And/or W/o  06/17/2013   CLINICAL DATA:  GI bleed of uncertain etiology, questionable bleeding ulcer  EXAM: CT ANGIOGRAPHY ABDOMEN AND PELVIS WITH CONTRAST  TECHNIQUE: Multidetector CT imaging of the abdomen and pelvis was performed using the standard protocol during bolus administration of intravenous contrast. Multiplanar reconstructed images and MIPs were obtained and reviewed to evaluate the vascular anatomy.  CONTRAST:  131mL OMNIPAQUE IOHEXOL 350 MG/ML SOLN  COMPARISON:  CT abdomen pelvis-earlier same day  FINDINGS: Vascular Findings:  Abdominal aorta: Rather extensive amounts of mixed noncalcified though predominantly calcified atherosclerotic plaque within a normal caliber abdominal aorta. No abdominal aortic dissection or periaortic stranding.  Celiac artery: There is a mixture of calcified and noncalcified atherosclerotic plaque involving in the origin of the celiac artery, not resulting in hemodynamically significant stenosis. Conventional branching pattern.  SMA: There is a mixture of calcified and noncalcified atherosclerotic plaque involving the origin of the SMA, not result in hemodynamically significant stenosis. A replaced right hepatic artery is incidentally noted to arise from the proximal aspect of the SMA.  Right Renal artery: Solitary; there is a minimal amount of eccentric mixed calcified and noncalcified atherosclerotic plaque involving the cranial aspect of the origin of the right renal artery, not resulting in hemodynamically significant stenosis  Left Renal artery: Solitary; there is a mixture of calcified and noncalcified atherosclerotic plaque involving the cranial aspect of the origin and proximal aspect of the left renal artery, not result in hemodynamically significant stenosis  IMA: Widely patent.  Pelvic vasculature: There is mixed calcified and noncalcified atherosclerotic plaque involving  the bilateral common and internal iliac artery is, not resulting in hemodynamically significant stenosis. The bilateral external iliac arteries are widely patent and of normal caliber.  Review of the MIP images confirms the above findings.   --------------------------------------------------------------------------------  Nonvascular Findings:  Normal hepatic  contour. There is an approximately 1.0 cm hypo attenuating (10 Hounsfield unit) cyst within the lateral segment of the left lobe of the liver (image 31, series 4). A punctate granuloma is noted within in the medial segment of the left lobe of the liver (image 31), likely the sequela of prior granulomatous infection. Several layering radiopaque gallstones are seen within an otherwise normal-appearing gallbladder. No intra or extrahepatic biliary duct dilatation. No ascites.  There is symmetric enhancement and excretion of the bilateral kidneys. No definite renal stones on this postcontrast examination. Note is made of a partially exophytic approximately 2.2 cm hypo attenuating (4 Hounsfield unit) left-sided renal cyst (image 58, series 4). No discrete right-sided renal lesions. There is a minimal amount of grossly symmetric likely age-related perinephric stranding. No urinary obstruction. Normal appearance of the bilateral adrenal glands, pancreas and spleen.  Redemonstrated extensive colonic diverticulosis. There is mesenteric stranding adjacent to the sigmoid colon within the left lower abdominal quadrant (representative axial images 89, 97, 98, series 4) worrisome for acute uncomplicated diverticulitis. No evidence of perforation or drainable fluid collection.  There is mild rather diffuse bowel wall thickening involving the distal small bowel (representative axial images 69, 97, 109, 118 and 121), not resulting in enteric obstruction. No pneumoperitoneum, pneumatosis or portal venous gas. Normal appearance of the appendix.  Scattered shoddy portahepatis and  retroperitoneal lymph nodes individually not enlarged by size criteria. No retroperitoneal, mesenteric, pelvic or inguinal lymph adenopathy.  A Foley catheter is seen within the decompressed urinary bladder. Adjacent to the posterior aspect of the sigmoid colon within the left hemipelvis is an approximately 2.5 x 1.5 cm nodular apparent soft tissue structure (image 116, series 4). This structure is immediately anterior to the previously identified approximately 2.9 cm peripherally calcified left adnexal structure (image 118, series 4). Otherwise, normal appearance of the pelvic organs. No free fluid within the pelvis.  Limited visualization of lower thorax rib demonstrates bilateral lower lung heterogeneous and consolidative opacities. No pleural effusion.  Cardiomegaly. Calcifications in the mitral valve annulus. No pericardial effusion.  There is mild-to-moderate diffuse trabecular thickening involving the right ilium suggestive of Paget's disease. A bone island is incidentally noted within the left ilium. An intraosseous hemangioma is incidentally noted within the T11 vertebral body. Mesenteric fat containing periumbilical hernia.  IMPRESSION: 1. Large amount of predominantly calcified atherosclerotic plaque within normal caliber abdominal aorta, not resulting in hemodynamically significant stenosis. No abdominal aortic dissection or evidence of mesenteric ischemia. 2. Findings again suggestive of acute uncomplicated diverticulitis involving the sigmoid colon within the left hemipelvis. No definite evidence of perforation or definable/drainable fluid collection. 3. Nonspecific mild though rather diffuse apparent bowel wall thickening involving the distal small bowel, not resulting in enteric obstruction - while possibly reactive secondary to ongoing acute diverticulitis, a secondary enteritis may have a similar appearance. Clinical correlation is advised. 4. Indeterminate approximately 2.5 cm nodular soft tissue  structure adjacent to the posterior wall of the sigmoid colon within the left hemipelvis. This structure is indeterminate with differential considerations including an enlarged possibly reactive left pelvic sidewall lymph node, a focally dilated left-sided gonadal vein or possibly an underdistended enlarged diverticulum. 5. Unchanged peripherally calcifying approximately 2.9 cm left-sided adnexal lesion nonspecific though possibly representative of a partially calcified ovarian dermoid. 6. Suspected Paget's disease involving the right ilium.   Electronically Signed   By: Sandi Mariscal M.D.   On: 06/17/2013 16:36   Medications: I have reviewed the patient's current medications. Scheduled Meds: . ceFEPime (MAXIPIME)  IV  2 g Intravenous Q12H  . levothyroxine  50 mcg Intravenous Daily  . pantoprazole  40 mg Oral Daily  . sodium chloride  3 mL Intravenous Q12H   Continuous Infusions: . sodium chloride 100 mL/hr at 06/18/13 0905   PRN Meds:.acetaminophen, morphine injection, promethazine Assessment/Plan:    Diverticulitis of colon Likely cause of her pain, CTA neg for Ischemic colitis, lactic acid cleared. -Change Abx to Cefepime and flagyl - Change IV PPI to PO once daily to reduce risk for C diff. - Will continue bowel rest, if further bleeding will consult GI. - Monitor CBC TID, will transfer for Hgb <9.    NSTEMI with new LBBB - Cardiology on board, no current chest pain.  Will trend trop. - Echo pending - Will need lexiscan when stable. -Not candidate for heparin/ASA due to acute GIB    Hypertension - Antihypertensives held given Acute GIB, hypotension.    Thyroid disease  IV synthroid    Hypokalemia- resolved Continue to monitor  Hypomagnesemia -Given 4g  VTE PPx: SCDs Dispo: Disposition is deferred at this time, awaiting improvement of current medical problems.    The patient does have a current PCP (Richard Marcia Brash, MD) and does not need an [facility] hospital follow-up  appointment after discharge.  The patient does not have transportation limitations that hinder transportation to clinic appointments.  .Services Needed at time of discharge: Y = Yes, Blank = No PT:   OT:   RN:   Equipment:   Other:     LOS: 1 day   Joni Reining, DO 06/18/2013, 4:20 PM

## 2013-06-18 NOTE — Progress Notes (Addendum)
BPR likely associated with colitis/diverticulitis. She had a similar episode previously. Exam improved somewhat. Continue IV ABX and bowel rest. Echocardiogram underway. I Spoke with Dr. Tommy Medal and his team at the bedside. Georganna Skeans, MD, MPH, FACS Pager: [phone]

## 2013-06-18 NOTE — Progress Notes (Signed)
INITIAL NUTRITION ASSESSMENT  DOCUMENTATION CODES Per approved criteria  -Not Applicable   INTERVENTION: Diet advancement per team. Recommend Ensure Complete po BID, each supplement provides 350 kcal and 13 grams of protein, once diet order permits. Pt would like milk chocolate flavor. RD to continue to follow nutrition care plan.  NUTRITION DIAGNOSIS: Inadequate oral intake related to inability to eat as evidenced by NPO status.   Goal: Diet advancement as tolerated. Intake to meet >90% of estimated nutrition needs.  Monitor:  weight trends, lab trends, I/O's, diet advancement  Reason for Assessment: Malnutrition Screening Tool  80 y.o. female  Admitting Dx: Diverticulitis of colon  ASSESSMENT: PMHx significant for HTN, hypothyroidism. Admitted with worsening upper abdominal pain x 2 days. Work-up reveals sigmoid colitis, possibly acute diverticulitis.  Pt reports anorexia as well as poor appetite during symptoms.  Pt eats frequent meals. Surgery recommending bowel rest. CTA of abdomen negative for mesenteric ischemia, no perforation.  Pt reports that her most recent weight loss is 2/2 stress (her family member was very sick recently.) She notes that she got down to 152 lb and is now up to 169 lb. She has been put on steroids for the past month and eating very well. Likes chocolate Ensure and is wondering when she will be able to eat. No significant fat/muscle mass loss present at this time.  Height: Ht Readings from Last 1 Encounters:  06/17/13 5\' 7"  (1.702 m)    Weight: Wt Readings from Last 1 Encounters:  06/18/13 169 lb 8.5 oz (76.9 kg)    Ideal Body Weight: 135 lb  % Ideal Body Weight: 125%  Wt Readings from Last 10 Encounters:  06/18/13 169 lb 8.5 oz (76.9 kg)  03/28/11 169 lb 6.4 oz (76.839 kg)  03/16/11 165 lb 9.1 oz (75.1 kg)  03/16/11 165 lb 9.1 oz (75.1 kg)    Usual Body Weight: 169 lb  % Usual Body Weight: 100%  BMI:  Body mass index is 26.55  kg/(m^2). Overweight  Estimated Nutritional Needs: Kcal: 1550 - 1750 Protein: 95 - 110 g Fluid: 1.6 - 1.8 liters  Skin: intact  Diet Order: NPO  EDUCATION NEEDS: -No education needs identified at this time   Intake/Output Summary (Last 24 hours) at 06/18/13 0931 Last data filed at 06/18/13 0400  Gross per 24 hour  Intake 3005.28 ml  Output   1575 ml  Net 1430.28 ml    Last BM: PTA  Labs:   Recent Labs Lab 06/17/13 0845 06/17/13 1659 06/18/13 0222  NA 142  --  137  K 3.0*  --  4.4  CL 105  --  104  CO2 24  --  19  BUN 13  --  12  CREATININE 0.75  --  0.74  CALCIUM 7.4*  --  7.3*  MG  --  1.3*  --   GLUCOSE 123*  --  118*    CBG (last 3)  No results found for this basename: GLUCAP,  in the last 72 hours  Scheduled Meds: . ceFEPime (MAXIPIME) IV  2 g Intravenous Q12H  . levothyroxine  50 mcg Intravenous Daily  . metronidazole  500 mg Intravenous Once  . pantoprazole  40 mg Oral Daily  . sodium chloride  3 mL Intravenous Q12H    Continuous Infusions: . sodium chloride 100 mL/hr at 06/18/13 2585    Past Medical History  Diagnosis Date  . Osteoarthritis   . Macular degeneration   . Hypertension   . Thyroid  disease   . Thyroid cancer   . GI bleed     Past Surgical History  Procedure Laterality Date  . Thyroidectomy    . Colonoscopy  03/17/2011    Procedure: COLONOSCOPY;  Surgeon: Rogene Houston, MD;  Location: [facility];  Service: Endoscopy;  Laterality: N/A;    Inda Coke MS, RD, LDN Inpatient Registered Dietitian Pager: [phone] After-hours pager: [phone]

## 2013-06-18 NOTE — Progress Notes (Signed)
Utilization review completed.  

## 2013-06-18 NOTE — Progress Notes (Signed)
Echo Lab  2D Echocardiogram completed.  [place], RDCS 06/18/2013 9:30 AM

## 2013-06-19 ENCOUNTER — Inpatient Hospital Stay (HOSPITAL_COMMUNITY): Payer: Medicare Other

## 2013-06-19 DIAGNOSIS — K5732 Diverticulitis of large intestine without perforation or abscess without bleeding: Secondary | ICD-10-CM

## 2013-06-19 DIAGNOSIS — R0602 Shortness of breath: Secondary | ICD-10-CM

## 2013-06-19 DIAGNOSIS — I4891 Unspecified atrial fibrillation: Secondary | ICD-10-CM

## 2013-06-19 LAB — CBC
HCT: 36 % (ref 36.0–46.0)
HEMATOCRIT: 32.8 % — AB (ref 36.0–46.0)
Hemoglobin: 11 g/dL — ABNORMAL LOW (ref 12.0–15.0)
Hemoglobin: 11.9 g/dL — ABNORMAL LOW (ref 12.0–15.0)
MCH: 29.2 pg (ref 26.0–34.0)
MCH: 29.3 pg (ref 26.0–34.0)
MCHC: 33.5 g/dL (ref 30.0–36.0)
MCHC: 33.1 g/dL (ref 30.0–36.0)
MCV: 87 fL (ref 78.0–100.0)
MCV: 88.7 fL (ref 78.0–100.0)
Platelets: 183 10*3/uL (ref 150–400)
Platelets: 193 10*3/uL (ref 150–400)
RBC: 3.77 MIL/uL — ABNORMAL LOW (ref 3.87–5.11)
RBC: 4.06 MIL/uL (ref 3.87–5.11)
RDW: 16.1 % — AB (ref 11.5–15.5)
RDW: 15.6 % — ABNORMAL HIGH (ref 11.5–15.5)
WBC: 16.9 10*3/uL — ABNORMAL HIGH (ref 4.0–10.5)
WBC: 17.4 10*3/uL — ABNORMAL HIGH (ref 4.0–10.5)

## 2013-06-19 LAB — TROPONIN I
TROPONIN I: 0.46 ng/mL — AB (ref <0.30)
TROPONIN I: 0.31 ng/mL — AB (ref <0.30)

## 2013-06-19 LAB — HIV ANTIBODY (ROUTINE TESTING W REFLEX): HIV: NONREACTIVE

## 2013-06-19 MED ORDER — METOPROLOL TARTRATE 12.5 MG HALF TABLET
12.5000 mg | ORAL_TABLET | Freq: Two times a day (BID) | ORAL | Status: DC
  Administered 2013-06-19 – 2013-06-20 (×2): 12.5 mg via ORAL
  Filled 2013-06-19 (×5): qty 1

## 2013-06-19 MED ORDER — LEVOTHYROXINE SODIUM 100 MCG PO TABS
100.0000 ug | ORAL_TABLET | ORAL | Status: DC
  Administered 2013-06-20 – 2013-06-21 (×2): 100 ug via ORAL
  Filled 2013-06-19 (×5): qty 1

## 2013-06-19 MED ORDER — METRONIDAZOLE IN NACL 5-0.79 MG/ML-% IV SOLN
500.0000 mg | Freq: Three times a day (TID) | INTRAVENOUS | Status: DC
  Administered 2013-06-19 – 2013-06-23 (×12): 500 mg via INTRAVENOUS
  Filled 2013-06-19 (×16): qty 100

## 2013-06-19 MED ORDER — METOPROLOL TARTRATE 1 MG/ML IV SOLN
2.5000 mg | INTRAVENOUS | Status: DC | PRN
  Filled 2013-06-19: qty 5

## 2013-06-19 NOTE — Progress Notes (Signed)
Subjective: Pain is better today.  White count trending down.  Pt went into afib last night, cards on board. Now complains of diarrhea, no recorded bloody BMs, h&h are stable.  Still c/o nausea.    Objective: Vital signs in last 24 hours: Temp:  [97.8 F (36.6 C)-98.7 F (37.1 C)] 97.8 F (36.6 C) (02/12 0750) Pulse Rate:  [84-110] 110 (02/12 0750) Resp:  [29-36] 29 (02/12 0750) BP: (111-136)/(52-81) 136/81 mmHg (02/12 0750) SpO2:  [91 %-98 %] 98 % (02/12 0750) Weight:  [75.5 kg (166 lb 7.2 oz)] 75.5 kg (166 lb 7.2 oz) (02/12 0319) Last BM Date: 06/19/13  Intake/Output from previous day: 02/11 0701 - 02/12 0700 In: 1391.7 [I.V.:1391.7] Out: 775 [Urine:775] Intake/Output this shift: Total I/O In: 983.3 [I.V.:883.3; IV Piggyback:100] Out: 200 [Urine:200]  PE General appearance: alert, cooperative and no distress Resp: clear to auscultation bilaterally Cardio: S1S2 irregularly irregular.  no murmurs, gallops or rubs.  trace non pitting pedal edema.  GI: +bs, abdomen is soft, mild generaized tenderness, no rebound tenderness or  guarding.    Lab Results:   Recent Labs  06/18/13 2257 06/19/13 0530  WBC 17.0* 16.9*  HGB 11.0* 11.0*  HCT 33.1* 32.8*  PLT 187 183   BMET  Recent Labs  06/17/13 0845 06/18/13 0222  NA 142 137  K 3.0* 4.4  CL 105 104  CO2 24 19  GLUCOSE 123* 118*  BUN 13 12  CREATININE 0.75 0.74  CALCIUM 7.4* 7.3*   PT/INR No results found for this basename: LABPROT, INR,  in the last 72 hours ABG No results found for this basename: PHART, PCO2, PO2, HCO3,  in the last 72 hours  Studies/Results: Ct Abdomen Pelvis Wo Contrast  06/17/2013   CLINICAL DATA:  Abdominal pain, history of colitis  EXAM: CT ABDOMEN AND PELVIS WITHOUT CONTRAST  TECHNIQUE: Multidetector CT imaging of the abdomen and pelvis was performed following the standard protocol without intravenous contrast.  COMPARISON:  None.  FINDINGS: Cardiomegaly is noted. Mitral valve  calcifications. There is patchy atelectasis or infiltrate in left lower lobe. Small hiatal hernia. Punctate calcifications within liver probable from prior granulomatous disease. The study is limited without IV contrast. Mild distended gallbladder. Tiny layering gallstones are noted within gallbladder. No pericholecystic fluid. Atherosclerotic calcifications of abdominal aorta and iliac arteries. Atherosclerotic calcifications bilateral renal artery origin and SMA origin. Unenhanced pancreas, spleen and adrenals are unremarkable. Bilateral kidneys shows a lobulated contour. Mild perinephric nonspecific stranding and fluid. Probable cyst in midpole anterior aspect of the left kidney measures 1.7 cm. No nephrolithiasis. No hydronephrosis or hydroureter. No calcified ureteral calculi. There is infraumbilical midline ventral hernia containing fat measures 3.7 cm.  Multiple colonic diverticula are noted. The terminal ileum is unremarkable. Normal appendix.  There is mild thickening of sigmoid colon wall. Multiple sigmoid colon diverticula are noted. Axial image 63 there is mild stranding of pericolonic fat and trace pericolonic fluid in proximal sigmoid colon. Findings are highly suspicious for mild diverticulitis. No definite perforation is noted on this unenhanced scan. No diverticular abscess.  The uterus is atrophic. There is a nodular structure with a ring peripheral calcification in left adnexal region measures 2.3 cm. This may represent a calcified ovarian cyst a calcified adnexal fibroid or a dermoid. A distended urinary bladder is noted. No calcified calculi are noted within urinary bladder.  No small bowel obstruction.  No free abdominal air.  No adenopathy.  Sagittal images of the spine shows osteopenia and mild degenerative changes  lumbar spine. Probable hemangioma T11 vertebral body.  IMPRESSION: 1. Patchy atelectasis or infiltrate in left lower lobe. 2. Multiple colonic diverticula. Multiple sigmoid colon  diverticula. There is mild pericolonic stranding and small amount of pericolonic fluid proximal sigmoid colon in left lower quadrant suspicious for mild diverticulitis. No diverticular abscess. 3. Distended urinary bladder without evidence of calcified calculi. 4. Infraumbilical midline ventral hernia containing fat measures about 3.7 cm. 5. Normal appendix.  No pericecal inflammation. 6. Atherosclerotic vascular calcifications. 7. Mild distended gallbladder.  Tiny layering gallstones. 8. Calcified lesion in left adnexa measures 2.3 cm. Please see above discussion.   Electronically Signed   By: Lahoma Crocker M.D.   On: 06/17/2013 10:33   Dg Chest Port 1 View  06/19/2013   CLINICAL DATA:  Shortness of Breath  EXAM: PORTABLE CHEST - 1 VIEW  COMPARISON:  06/17/2013  FINDINGS: There is central vascular congestion and mild interstitial prominence bilaterally suspicious for mild interstitial edema. Bilateral basilar hazy atelectasis or infiltrate. Cardiomegaly again noted.  IMPRESSION: There is central vascular congestion and mild interstitial prominence bilaterally suspicious for mild interstitial edema. Bilateral basilar hazy atelectasis or infiltrate.   Electronically Signed   By: Lahoma Crocker M.D.   On: 06/19/2013 09:04   Ct Angio Abd/pel W/ And/or W/o  06/17/2013   CLINICAL DATA:  GI bleed of uncertain etiology, questionable bleeding ulcer  EXAM: CT ANGIOGRAPHY ABDOMEN AND PELVIS WITH CONTRAST  TECHNIQUE: Multidetector CT imaging of the abdomen and pelvis was performed using the standard protocol during bolus administration of intravenous contrast. Multiplanar reconstructed images and MIPs were obtained and reviewed to evaluate the vascular anatomy.  CONTRAST:  121mL OMNIPAQUE IOHEXOL 350 MG/ML SOLN  COMPARISON:  CT abdomen pelvis-earlier same day  FINDINGS: Vascular Findings:  Abdominal aorta: Rather extensive amounts of mixed noncalcified though predominantly calcified atherosclerotic plaque within a normal  caliber abdominal aorta. No abdominal aortic dissection or periaortic stranding.  Celiac artery: There is a mixture of calcified and noncalcified atherosclerotic plaque involving in the origin of the celiac artery, not resulting in hemodynamically significant stenosis. Conventional branching pattern.  SMA: There is a mixture of calcified and noncalcified atherosclerotic plaque involving the origin of the SMA, not result in hemodynamically significant stenosis. A replaced right hepatic artery is incidentally noted to arise from the proximal aspect of the SMA.  Right Renal artery: Solitary; there is a minimal amount of eccentric mixed calcified and noncalcified atherosclerotic plaque involving the cranial aspect of the origin of the right renal artery, not resulting in hemodynamically significant stenosis  Left Renal artery: Solitary; there is a mixture of calcified and noncalcified atherosclerotic plaque involving the cranial aspect of the origin and proximal aspect of the left renal artery, not result in hemodynamically significant stenosis  IMA: Widely patent.  Pelvic vasculature: There is mixed calcified and noncalcified atherosclerotic plaque involving the bilateral common and internal iliac artery is, not resulting in hemodynamically significant stenosis. The bilateral external iliac arteries are widely patent and of normal caliber.  Review of the MIP images confirms the above findings.   --------------------------------------------------------------------------------  Nonvascular Findings:  Normal hepatic contour. There is an approximately 1.0 cm hypo attenuating (10 Hounsfield unit) cyst within the lateral segment of the left lobe of the liver (image 31, series 4). A punctate granuloma is noted within in the medial segment of the left lobe of the liver (image 31), likely the sequela of prior granulomatous infection. Several layering radiopaque gallstones are seen within an otherwise normal-appearing  gallbladder.  No intra or extrahepatic biliary duct dilatation. No ascites.  There is symmetric enhancement and excretion of the bilateral kidneys. No definite renal stones on this postcontrast examination. Note is made of a partially exophytic approximately 2.2 cm hypo attenuating (4 Hounsfield unit) left-sided renal cyst (image 58, series 4). No discrete right-sided renal lesions. There is a minimal amount of grossly symmetric likely age-related perinephric stranding. No urinary obstruction. Normal appearance of the bilateral adrenal glands, pancreas and spleen.  Redemonstrated extensive colonic diverticulosis. There is mesenteric stranding adjacent to the sigmoid colon within the left lower abdominal quadrant (representative axial images 89, 97, 98, series 4) worrisome for acute uncomplicated diverticulitis. No evidence of perforation or drainable fluid collection.  There is mild rather diffuse bowel wall thickening involving the distal small bowel (representative axial images 69, 97, 109, 118 and 121), not resulting in enteric obstruction. No pneumoperitoneum, pneumatosis or portal venous gas. Normal appearance of the appendix.  Scattered shoddy portahepatis and retroperitoneal lymph nodes individually not enlarged by size criteria. No retroperitoneal, mesenteric, pelvic or inguinal lymph adenopathy.  A Foley catheter is seen within the decompressed urinary bladder. Adjacent to the posterior aspect of the sigmoid colon within the left hemipelvis is an approximately 2.5 x 1.5 cm nodular apparent soft tissue structure (image 116, series 4). This structure is immediately anterior to the previously identified approximately 2.9 cm peripherally calcified left adnexal structure (image 118, series 4). Otherwise, normal appearance of the pelvic organs. No free fluid within the pelvis.  Limited visualization of lower thorax rib demonstrates bilateral lower lung heterogeneous and consolidative opacities. No pleural  effusion.  Cardiomegaly. Calcifications in the mitral valve annulus. No pericardial effusion.  There is mild-to-moderate diffuse trabecular thickening involving the right ilium suggestive of Paget's disease. A bone island is incidentally noted within the left ilium. An intraosseous hemangioma is incidentally noted within the T11 vertebral body. Mesenteric fat containing periumbilical hernia.  IMPRESSION: 1. Large amount of predominantly calcified atherosclerotic plaque within normal caliber abdominal aorta, not resulting in hemodynamically significant stenosis. No abdominal aortic dissection or evidence of mesenteric ischemia. 2. Findings again suggestive of acute uncomplicated diverticulitis involving the sigmoid colon within the left hemipelvis. No definite evidence of perforation or definable/drainable fluid collection. 3. Nonspecific mild though rather diffuse apparent bowel wall thickening involving the distal small bowel, not resulting in enteric obstruction - while possibly reactive secondary to ongoing acute diverticulitis, a secondary enteritis may have a similar appearance. Clinical correlation is advised. 4. Indeterminate approximately 2.5 cm nodular soft tissue structure adjacent to the posterior wall of the sigmoid colon within the left hemipelvis. This structure is indeterminate with differential considerations including an enlarged possibly reactive left pelvic sidewall lymph node, a focally dilated left-sided gonadal vein or possibly an underdistended enlarged diverticulum. 5. Unchanged peripherally calcifying approximately 2.9 cm left-sided adnexal lesion nonspecific though possibly representative of a partially calcified ovarian dermoid. 6. Suspected Paget's disease involving the right ilium.   Electronically Signed   By: Sandi Mariscal M.D.   On: 06/17/2013 16:36    Anti-infectives: Anti-infectives   Start     Dose/Rate Route Frequency Ordered Stop   06/19/13 0900  metroNIDAZOLE (FLAGYL) IVPB  500 mg     500 mg 100 mL/hr over 60 Minutes Intravenous Every 8 hours 06/19/13 0834     06/18/13 1215  metroNIDAZOLE (FLAGYL) IVPB 500 mg     500 mg 100 mL/hr over 60 Minutes Intravenous  Once 06/18/13 1134 06/18/13 1327   06/18/13 1000  ceFEPIme (  MAXIPIME) 2 g in dextrose 5 % 50 mL IVPB     2 g 100 mL/hr over 30 Minutes Intravenous Every 12 hours 06/18/13 0831     06/18/13 0900  metroNIDAZOLE (FLAGYL) IVPB 500 mg     500 mg 100 mL/hr over 60 Minutes Intravenous  Once 06/18/13 0757 06/18/13 1127   06/18/13 0800  vancomycin (VANCOCIN) IVPB 750 mg/150 ml premix  Status:  Discontinued     750 mg 150 mL/hr over 60 Minutes Intravenous Every 12 hours 06/17/13 1637 06/18/13 0757   06/17/13 2300  ciprofloxacin (CIPRO) IVPB 400 mg  Status:  Discontinued     400 mg 200 mL/hr over 60 Minutes Intravenous Every 12 hours 06/17/13 1852 06/17/13 1853   06/17/13 2000  metroNIDAZOLE (FLAGYL) IVPB 500 mg  Status:  Discontinued     500 mg 100 mL/hr over 60 Minutes Intravenous Every 8 hours 06/17/13 1852 06/17/13 1853   06/17/13 1730  vancomycin (VANCOCIN) 1,500 mg in sodium chloride 0.9 % 500 mL IVPB     1,500 mg 250 mL/hr over 120 Minutes Intravenous  Once 06/17/13 1637 06/17/13 2114   06/17/13 1700  piperacillin-tazobactam (ZOSYN) IVPB 3.375 g  Status:  Discontinued     3.375 g 12.5 mL/hr over 240 Minutes Intravenous 3 times per day 06/17/13 1635 06/18/13 0757   06/17/13 1100  ciprofloxacin (CIPRO) IVPB 400 mg     400 mg 200 mL/hr over 60 Minutes Intravenous  Once 06/17/13 1046 06/17/13 1315   06/17/13 1100  metroNIDAZOLE (FLAGYL) IVPB 500 mg     500 mg 100 mL/hr over 60 Minutes Intravenous  Once 06/17/13 1046 06/17/13 1544      Assessment/Plan: Abdominal pain  Leukocytosis  sigmoid colitis, possible diverticulitis  Elevated troponins  Lactic acidosis  Positive FOBT  Hx LGI bleed 2012  Pain has improved overall, still complains of nausea.  Hemoglobin has remained stable.  Non surgical  abdomen.  Will follow.    LOS: 2 days    Trishelle Devora ANP-BC 06/19/2013 9:46 AM

## 2013-06-19 NOTE — Progress Notes (Signed)
Internal Medicine Teaching Service Daily Progress Note  Subjective: Abdominal pain much improved, would like to drink something. Patient with acute onset SOB last night, evaluated by overnight team and EKG obtained >>>new onset Afib.  Overnight team started IV metop prn.  Cardiology started Metoprolol tartrate 12.5mg  BID this AM.  Patient was on Metop succinate 12.5 mg daily at home. Objective: Vital signs in last 24 hours: Filed Vitals:   06/19/13 0306 06/19/13 0319 06/19/13 0609 06/19/13 0750  BP: 121/62  126/69 136/81  Pulse: 84  97 110  Temp: 98.7 F (37.1 C)   97.8 F (36.6 C)  TempSrc: Oral   Oral  Resp: 31  29 29   Height:      Weight:  166 lb 7.2 oz (75.5 kg)    SpO2: 98%  94% 98%   Weight change: -2 lb 12.8 oz (-1.271 kg)  Intake/Output Summary (Last 24 hours) at 06/19/13 1146 Last data filed at 06/19/13 0750  Gross per 24 hour  Intake   2375 ml  Output    800 ml  Net   1575 ml   General: resting in bed HEENT: EOMI, no scleral icterus Cardiac: Irreguarly Irregular, 2/6 systolic murmur Pulm: clear to auscultation bilaterally Abd: soft, Mild TTP in RLQ, LLQ, nondistended, BS present Ext: warm and well perfused, no pedal edema Neuro: alert and oriented X3, cranial nerves II-XII grossly intact  Lab Results: Basic Metabolic Panel:  Recent Labs Lab 06/17/13 0845 06/17/13 1659 06/18/13 0222  NA 142  --  137  K 3.0*  --  4.4  CL 105  --  104  CO2 24  --  19  GLUCOSE 123*  --  118*  BUN 13  --  12  CREATININE 0.75  --  0.74  CALCIUM 7.4*  --  7.3*  MG  --  1.3*  --    Liver Function Tests:  Recent Labs Lab 06/17/13 0845 06/18/13 0222  AST 20 23  ALT 18 16  ALKPHOS 72 67  BILITOT 1.0 1.5*  PROT 5.8* 5.2*  ALBUMIN 2.8* 2.3*    Recent Labs Lab 06/17/13 0845  LIPASE 26   CBC:  Recent Labs Lab 06/17/13 0845  06/19/13 0530 06/19/13 1017  WBC 19.7*  < > 16.9* 17.4*  NEUTROABS 17.2*  --   --   --   HGB 12.8  < > 11.0* 11.9*  HCT 38.5  < >  32.8* 36.0  MCV 87.3  < > 87.0 88.7  PLT 210  < > 183 193  < > = values in this interval not displayed. Cardiac Enzymes:  Recent Labs Lab 06/18/13 0222 06/18/13 1020 06/18/13 1824  TROPONINI 0.49* 0.56* 0.83*   Urinalysis:  Recent Labs Lab 06/17/13 0935  COLORURINE YELLOW  LABSPEC 1.013  PHURINE 5.5  GLUCOSEU NEGATIVE  HGBUR TRACE*  BILIRUBINUR NEGATIVE  KETONESUR 15*  PROTEINUR NEGATIVE  UROBILINOGEN 0.2  NITRITE NEGATIVE  LEUKOCYTESUR TRACE*    Micro Results: Recent Results (from the past 240 hour(s))  CULTURE, BLOOD (ROUTINE X 2)     Status: None   Collection Time    06/17/13  4:45 PM      Result Value Ref Range Status   Specimen Description BLOOD RIGHT HAND   Final   Special Requests BOTTLES DRAWN AEROBIC AND ANAEROBIC 10CC   Final   Culture  Setup Time     Final   Value: 06/17/2013 22:11     Performed at [facility]  Final   Value:        BLOOD CULTURE RECEIVED NO GROWTH TO DATE CULTURE WILL BE HELD FOR 5 DAYS BEFORE ISSUING A FINAL NEGATIVE REPORT     Performed at [facility]   Report Status PENDING   Incomplete  CULTURE, BLOOD (ROUTINE X 2)     Status: None   Collection Time    06/17/13  4:54 PM      Result Value Ref Range Status   Specimen Description BLOOD LEFT HAND   Final   Special Requests BOTTLES DRAWN AEROBIC ONLY 5CC   Final   Culture  Setup Time     Final   Value: 06/17/2013 22:11     Performed at [facility]   Culture     Final   Value:        BLOOD CULTURE RECEIVED NO GROWTH TO DATE CULTURE WILL BE HELD FOR 5 DAYS BEFORE ISSUING A FINAL NEGATIVE REPORT     Performed at [facility]   Report Status PENDING   Incomplete  MRSA PCR SCREENING     Status: None   Collection Time    06/17/13  6:53 PM      Result Value Ref Range Status   MRSA by PCR NEGATIVE  NEGATIVE Final   Comment:            The GeneXpert MRSA Assay (FDA     approved for NASAL specimens     only), is one component of a      comprehensive MRSA colonization     surveillance program. It is not     intended to diagnose MRSA     infection nor to guide or     monitor treatment for     MRSA infections.   Studies/Results: Dg Chest Port 1 View  06/19/2013   CLINICAL DATA:  Shortness of Breath  EXAM: PORTABLE CHEST - 1 VIEW  COMPARISON:  06/17/2013  FINDINGS: There is central vascular congestion and mild interstitial prominence bilaterally suspicious for mild interstitial edema. Bilateral basilar hazy atelectasis or infiltrate. Cardiomegaly again noted.  IMPRESSION: There is central vascular congestion and mild interstitial prominence bilaterally suspicious for mild interstitial edema. Bilateral basilar hazy atelectasis or infiltrate.   Electronically Signed   By: Lahoma Crocker M.D.   On: 06/19/2013 09:04   Ct Angio Abd/pel W/ And/or W/o  06/17/2013   CLINICAL DATA:  GI bleed of uncertain etiology, questionable bleeding ulcer  EXAM: CT ANGIOGRAPHY ABDOMEN AND PELVIS WITH CONTRAST  TECHNIQUE: Multidetector CT imaging of the abdomen and pelvis was performed using the standard protocol during bolus administration of intravenous contrast. Multiplanar reconstructed images and MIPs were obtained and reviewed to evaluate the vascular anatomy.  CONTRAST:  16mL OMNIPAQUE IOHEXOL 350 MG/ML SOLN  COMPARISON:  CT abdomen pelvis-earlier same day  FINDINGS: Vascular Findings:  Abdominal aorta: Rather extensive amounts of mixed noncalcified though predominantly calcified atherosclerotic plaque within a normal caliber abdominal aorta. No abdominal aortic dissection or periaortic stranding.  Celiac artery: There is a mixture of calcified and noncalcified atherosclerotic plaque involving in the origin of the celiac artery, not resulting in hemodynamically significant stenosis. Conventional branching pattern.  SMA: There is a mixture of calcified and noncalcified atherosclerotic plaque involving the origin of the SMA, not result in hemodynamically  significant stenosis. A replaced right hepatic artery is incidentally noted to arise from the proximal aspect of the SMA.  Right Renal artery: Solitary; there is a minimal amount of  eccentric mixed calcified and noncalcified atherosclerotic plaque involving the cranial aspect of the origin of the right renal artery, not resulting in hemodynamically significant stenosis  Left Renal artery: Solitary; there is a mixture of calcified and noncalcified atherosclerotic plaque involving the cranial aspect of the origin and proximal aspect of the left renal artery, not result in hemodynamically significant stenosis  IMA: Widely patent.  Pelvic vasculature: There is mixed calcified and noncalcified atherosclerotic plaque involving the bilateral common and internal iliac artery is, not resulting in hemodynamically significant stenosis. The bilateral external iliac arteries are widely patent and of normal caliber.  Review of the MIP images confirms the above findings.   --------------------------------------------------------------------------------  Nonvascular Findings:  Normal hepatic contour. There is an approximately 1.0 cm hypo attenuating (10 Hounsfield unit) cyst within the lateral segment of the left lobe of the liver (image 31, series 4). A punctate granuloma is noted within in the medial segment of the left lobe of the liver (image 31), likely the sequela of prior granulomatous infection. Several layering radiopaque gallstones are seen within an otherwise normal-appearing gallbladder. No intra or extrahepatic biliary duct dilatation. No ascites.  There is symmetric enhancement and excretion of the bilateral kidneys. No definite renal stones on this postcontrast examination. Note is made of a partially exophytic approximately 2.2 cm hypo attenuating (4 Hounsfield unit) left-sided renal cyst (image 58, series 4). No discrete right-sided renal lesions. There is a minimal amount of grossly symmetric likely age-related  perinephric stranding. No urinary obstruction. Normal appearance of the bilateral adrenal glands, pancreas and spleen.  Redemonstrated extensive colonic diverticulosis. There is mesenteric stranding adjacent to the sigmoid colon within the left lower abdominal quadrant (representative axial images 89, 97, 98, series 4) worrisome for acute uncomplicated diverticulitis. No evidence of perforation or drainable fluid collection.  There is mild rather diffuse bowel wall thickening involving the distal small bowel (representative axial images 69, 97, 109, 118 and 121), not resulting in enteric obstruction. No pneumoperitoneum, pneumatosis or portal venous gas. Normal appearance of the appendix.  Scattered shoddy portahepatis and retroperitoneal lymph nodes individually not enlarged by size criteria. No retroperitoneal, mesenteric, pelvic or inguinal lymph adenopathy.  A Foley catheter is seen within the decompressed urinary bladder. Adjacent to the posterior aspect of the sigmoid colon within the left hemipelvis is an approximately 2.5 x 1.5 cm nodular apparent soft tissue structure (image 116, series 4). This structure is immediately anterior to the previously identified approximately 2.9 cm peripherally calcified left adnexal structure (image 118, series 4). Otherwise, normal appearance of the pelvic organs. No free fluid within the pelvis.  Limited visualization of lower thorax rib demonstrates bilateral lower lung heterogeneous and consolidative opacities. No pleural effusion.  Cardiomegaly. Calcifications in the mitral valve annulus. No pericardial effusion.  There is mild-to-moderate diffuse trabecular thickening involving the right ilium suggestive of Paget's disease. A bone island is incidentally noted within the left ilium. An intraosseous hemangioma is incidentally noted within the T11 vertebral body. Mesenteric fat containing periumbilical hernia.  IMPRESSION: 1. Large amount of predominantly calcified  atherosclerotic plaque within normal caliber abdominal aorta, not resulting in hemodynamically significant stenosis. No abdominal aortic dissection or evidence of mesenteric ischemia. 2. Findings again suggestive of acute uncomplicated diverticulitis involving the sigmoid colon within the left hemipelvis. No definite evidence of perforation or definable/drainable fluid collection. 3. Nonspecific mild though rather diffuse apparent bowel wall thickening involving the distal small bowel, not resulting in enteric obstruction - while possibly reactive secondary to ongoing acute diverticulitis,  a secondary enteritis may have a similar appearance. Clinical correlation is advised. 4. Indeterminate approximately 2.5 cm nodular soft tissue structure adjacent to the posterior wall of the sigmoid colon within the left hemipelvis. This structure is indeterminate with differential considerations including an enlarged possibly reactive left pelvic sidewall lymph node, a focally dilated left-sided gonadal vein or possibly an underdistended enlarged diverticulum. 5. Unchanged peripherally calcifying approximately 2.9 cm left-sided adnexal lesion nonspecific though possibly representative of a partially calcified ovarian dermoid. 6. Suspected Paget's disease involving the right ilium.   Electronically Signed   By: Sandi Mariscal M.D.   On: 06/17/2013 16:36   Medications: I have reviewed the patient's current medications. Scheduled Meds: . ceFEPime (MAXIPIME) IV  2 g Intravenous Q12H  . levothyroxine  50 mcg Intravenous Daily  . metoprolol tartrate  12.5 mg Oral BID  . metronidazole  500 mg Intravenous Q8H  . pantoprazole  40 mg Oral Daily  . sodium chloride  3 mL Intravenous Q12H   Continuous Infusions:   PRN Meds:.acetaminophen, metoprolol, morphine injection, promethazine Assessment/Plan:  Acute Shortness of breath Likely due to new onset Afib. ?Flash pulmonary edema - Obtained Stat Portable CXR- showed only mild  edema.  Patient is symptomatically improving will continue to monitor and give supplemental O2. - Patient is at risk of PE (patient not on DVT PPx due to GIB contraindication) however, given her acute GIB a CTA will not be helpful because she is not a good candidate for A/C. - Continue to monitor.  New onset Atrial Fibrillation - Rate control: Started Metoprolol 12.5 BID - Anticoagulation Not a candidate due to GIB  - CHA2DS2-VASc score = 4 - If remains in afib for next 24 hours will start Amio IV.     Diverticulitis of colon- Improving - Will give clear liquid diet - Abx Cefepime and flagyl (consider deescalating tomorrow) - CBC stable (last trended up), will change frequency to daily - Will get PT/ OT to get out of bed and treat.    NSTEMI with new LBBB - Cardiology on board, no current chest pain.  Last trop 0.83 will repeat this AM if trend down will stop checking. - Cardiology plans for lexiscan when stable. (Although patient may not been good candidate for stent placement and antiplatelet Tx given comorbidity and recurrent GIB) -Not candidate for heparin/ASA due to acute GIB    Hypertension - Antihypertensives held given Acute GIB, hypotension. - Restarted Metoprolol for Afib.    Thyroid disease  IV synthroid    Hypokalemia- resolved - AM BMP Hypomagnesemia - Repeat Mag in AM  Diet: Clears VTE PPx: SCDs Dispo: Disposition is deferred at this time, awaiting improvement of current medical problems.    The patient does have a current PCP (Richard Marcia Brash, MD) and does not need an [facility] hospital follow-up appointment after discharge.  The patient does not have transportation limitations that hinder transportation to clinic appointments.  .Services Needed at time of discharge: Y = Yes, Blank = No PT:   OT:   RN:   Equipment:   Other:     LOS: 2 days   Joni Reining, DO 06/19/2013, 11:46 AM

## 2013-06-19 NOTE — Progress Notes (Addendum)
  Date: 06/19/2013  Patient name: Danielle [name]  Medical record number: [id number]  Date of birth: Dec 09, 1930   This patient has been seen and the plan of care was discussed with the house staff. Please see their note for complete details. I concur with their findings with the following additions/corrections:   80 year-old with diverticulitis and diverticular bleed, new left bundle branch block elevated troponins and now new atrial fibrillation with RVR.  Overall she seems improved from yesterday in terms of her abdominal pain which is diminished she is much less tender on exam today. She's not having any more bloody bowel movements she is moving her bowels. Currently she is without chest pain. Her rate is in the low 100s and she is being begun on oral data blockade again.  She would not seem to be a good candidate for heparin either for elevated troponins or for her atrial fibrillation. She is going to get a plexi scan Myoview once her GI issues have resolved. Another concern would be if this Myoview test was positive and one proceeded to angiography with placement of a stent would this be worked worth it if the patient would need to be on antiplatelet therapy with Plavix, given her colonic bleed. Currently her hemoglobin is stable and the bleeding from her likely diverticular source seems to have stabilized.  Really appreciate general surgery and cardiology is assistance with this patient.  Note there was an increase with regards to possible urinary tract infection by the coders and CQI folks,  I do not think this patient has any evidence of urinary tract infection her primary problem is diverticulitis with a diverticular bleed and consequent, SIRS, Sepsis with stress to her heart elevated troponins and AF w RVR.    Truman Hayward, MD 06/19/2013, 11:52 AM

## 2013-06-19 NOTE — Progress Notes (Signed)
Patient rhythm changed to atrial fibrillation.12 lead EKG obtained to confirm .Call placed to on call MD.

## 2013-06-19 NOTE — Evaluation (Signed)
Physical Therapy Evaluation Patient Details Name: ANGLIA BLAKLEY MRN: [id number] DOB: Oct 08, 1930 Today's Date: 06/19/2013 Time: 6213-0865 PT Time Calculation (min): 15 min  PT Assessment / Plan / Recommendation History of Present Illness  Ms. Edgren is a 80yo woman with history of HTN & hypothyroidism (s/p thyroidectomy d/t thyroid Ca) who presents with 2 days of worsening upper abdominal pain described as 10/10, sore and crampy.  Not similar to anything she has experienced in the past, but was hospitalized in 2012 with diverticulitis. Pain was severe to the point of passing out last night.  She had an episode of emesis at 1 am (unclear if bloody) and husband reports another episode since arrival at the hospital. She reports non bloody diarrhea, but unable to quantify number of episodes or when it started.  [name]. Denies fever/chills. Denies chest pain, SOB, palpitations. Reports lightheadedness/dizziness. Denies NSAID use.  She also reports she is not taking a PPI.  Clinical Impression  Pt adm due to the above. Presents with limitations in functional mobility and decreased independence with mobility. Pt to benefit form skilled acute PT to address deficits listed below (see PT problem list) and increase mobility prior to d/c home with husband. Pt limited in evaluation today due to nausea and pain. RN notified and pt returned to bed. Pt may benefit from RW for mobility upon D/C, pending pain and progress with rehab.     PT Assessment  Patient needs continued PT services    Follow Up Recommendations  No PT follow up;Supervision/Assistance - 24 hour    Does the patient have the potential to tolerate intense rehabilitation      Barriers to Discharge        Equipment Recommendations  Rolling walker with 5" wheels    Recommendations for Other Services OT consult   Frequency Min 3X/week    Precautions / Restrictions Precautions Precautions:  Fall Precaution Comments: denies any recent falls Restrictions Weight Bearing Restrictions: No   Pertinent Vitals/Pain 9/10 in abdomen; RN notified.       Mobility  Bed Mobility Overal bed mobility: Needs Assistance Bed Mobility: Sit to Supine Sit to supine: Supervision General bed mobility comments: incr time due to abdominal pain; cues for line management and safety/sequencing Transfers Overall transfer level: Needs assistance Equipment used: Rolling walker (2 wheeled) Transfers: Sit to/from Stand Sit to Stand: Min assist General transfer comment: cues for hand placement and sequencing on RW; min (A) to steady due to pain  Ambulation/Gait Ambulation/Gait assistance: Min guard Ambulation Distance (Feet): 6 Feet Assistive device: Rolling walker (2 wheeled) Gait Pattern/deviations: Step-through pattern;Decreased stride length;Wide base of support;Trunk flexed Gait velocity: decreased due to pain; guarded Gait velocity interpretation: <1.8 ft/sec, indicative of risk for recurrent falls General Gait Details: pt limited in ambulation today due to pain; requesting to return to bed; cues for upright posture and gt sequencing; min guard to steady and for safety due to gt abnormalites          PT Diagnosis: Acute pain;Abnormality of gait  PT Problem List: Decreased activity tolerance;Decreased balance;Decreased mobility;Decreased knowledge of use of DME;Pain PT Treatment Interventions: DME instruction;Gait training;Functional mobility training;Therapeutic activities;Therapeutic exercise;Balance training;Neuromuscular re-education;Patient/family education     PT Goals(Current goals can be found in the care plan section) Acute Rehab PT Goals Patient Stated Goal: to get this pain to go away so i can go home PT Goal Formulation: With patient Time For Goal Achievement: 07/03/13 Potential to  Achieve Goals: Good  Visit Information  Last PT Received On: 06/19/13 Assistance Needed:  +1 History of Present Illness: Ms. Kachmar is a 80yo woman with history of HTN & hypothyroidism (s/p thyroidectomy d/t thyroid Ca) who presents with 2 days of worsening upper abdominal pain described as 10/10, sore and crampy.  Not similar to anything she has experienced in the past, but was hospitalized in 2012 with diverticulitis. Pain was severe to the point of passing out last night.  She had an episode of emesis at 1 am (unclear if bloody) and husband reports another episode since arrival at the hospital. She reports non bloody diarrhea, but unable to quantify number of episodes or when it started.  [name]. Denies fever/chills. Denies chest pain, SOB, palpitations. Reports lightheadedness/dizziness. Denies NSAID use.  She also reports she is not taking a PPI.       Prior [place] expects to be discharged to:: Private residence Living Arrangements: Spouse/significant other Available Help at Discharge: Family;Available 24 hours/day Type of Home: House Home Access: Ramped entrance Home Layout: One level Home Equipment: None Prior Function Level of Independence: Independent Communication Communication: No difficulties Dominant Hand: Right    Cognition  Cognition Arousal/Alertness: Awake/alert Behavior During Therapy: WFL for tasks assessed/performed Overall Cognitive Status: Within Functional Limits for tasks assessed    Extremity/Trunk Assessment Upper Extremity Assessment Upper Extremity Assessment: Defer to OT evaluation Lower Extremity Assessment Lower Extremity Assessment: Overall WFL for tasks assessed Cervical / Trunk Assessment Cervical / Trunk Assessment: Normal   Balance Balance Overall balance assessment: Needs assistance Sitting-balance support: Feet supported;Bilateral upper extremity supported Sitting balance-Leahy Scale: Good Standing balance support: Bilateral upper extremity  supported;During functional activity Standing balance-Leahy Scale: Fair  End of Session PT - End of Session Equipment Utilized During Treatment: Gait belt;Oxygen (2L O2) Activity Tolerance: Patient limited by pain Patient left: in bed;with call bell/phone within reach Nurse Communication: Mobility status;Precautions;Patient requests pain meds  GP     Gustavus Bryant, [place] 06/19/2013, 5:52 PM

## 2013-06-19 NOTE — Progress Notes (Signed)
Subjective:  Abd pain better, c/o some SOB  Objective:  Temp:  [97.8 F (36.6 C)-98.7 F (37.1 C)] 97.8 F (36.6 C) (02/12 0750) Pulse Rate:  [84-110] 110 (02/12 0750) Resp:  [29-36] 29 (02/12 0750) BP: (111-136)/(52-81) 136/81 mmHg (02/12 0750) SpO2:  [91 %-98 %] 98 % (02/12 0750) Weight:  [166 lb 7.2 oz (75.5 kg)] 166 lb 7.2 oz (75.5 kg) (02/12 0319) Weight change: -2 lb 12.8 oz (-1.271 kg)  Intake/Output from previous day: 02/11 0701 - 02/12 0700 In: 1391.7 [I.V.:1391.7] Out: 775 [Urine:775]  Intake/Output from this shift: Total I/O In: 983.3 [I.V.:883.3; IV Piggyback:100] Out: 200 [Urine:200]  Physical Exam: General appearance: alert and no distress Neck: no adenopathy, no carotid bruit, no JVD, supple, symmetrical, trachea midline and thyroid not enlarged, symmetric, no tenderness/mass/nodules Lungs: decreased BS right base with occassional sczattered rales Heart: irregularly irregular rhythm Abdomen: soft and non tender this am Extremities: extremities normal, atraumatic, no cyanosis or edema  Lab Results: Results for orders placed during the hospital encounter of 06/17/13 (from the past 48 hour(s))  TROPONIN I     Status: Abnormal   Collection Time    06/17/13 10:58 AM      Result Value Ref Range   Troponin I 0.45 (*) <0.30 ng/mL   Comment:            Due to the release kinetics of cTnI,     a negative result within the first hours     of the onset of symptoms does not rule out     myocardial infarction with certainty.     If myocardial infarction is still suspected,     repeat the test at appropriate intervals.     CRITICAL RESULT CALLED TO, READ BACK BY AND VERIFIED WITH:     K.COBB,RN 06/17/13 1149 BY BSLADE  CBC     Status: Abnormal   Collection Time    06/17/13  1:00 PM      Result Value Ref Range   WBC 20.9 (*) 4.0 - 10.5 K/uL   RBC 4.47  3.87 - 5.11 MIL/uL   Hemoglobin 13.1  12.0 - 15.0 g/dL   HCT 39.2  36.0 - 46.0 %   MCV 87.7  78.0 -  100.0 fL   MCH 29.3  26.0 - 34.0 pg   MCHC 33.4  30.0 - 36.0 g/dL   RDW 14.5  11.5 - 15.5 %   Platelets 206  150 - 400 K/uL  LACTIC ACID, PLASMA     Status: Abnormal   Collection Time    06/17/13  1:00 PM      Result Value Ref Range   Lactic Acid, Venous 3.3 (*) 0.5 - 2.2 mmol/L  CULTURE, BLOOD (ROUTINE X 2)     Status: None   Collection Time    06/17/13  4:45 PM      Result Value Ref Range   Specimen Description BLOOD RIGHT HAND     Special Requests BOTTLES DRAWN AEROBIC AND ANAEROBIC 10CC     Culture  Setup Time       Value: 06/17/2013 22:11     Performed at [facility]   Culture       Value:        BLOOD CULTURE RECEIVED NO GROWTH TO DATE CULTURE WILL BE HELD FOR 5 DAYS BEFORE ISSUING A FINAL NEGATIVE REPORT     Performed at [facility]   Report Status PENDING  CULTURE, BLOOD (ROUTINE X 2)     Status: None   Collection Time    06/17/13  4:54 PM      Result Value Ref Range   Specimen Description BLOOD LEFT HAND     Special Requests BOTTLES DRAWN AEROBIC ONLY 5CC     Culture  Setup Time       Value: 06/17/2013 22:11     Performed at [facility]   Culture       Value:        BLOOD CULTURE RECEIVED NO GROWTH TO DATE CULTURE WILL BE HELD FOR 5 DAYS BEFORE ISSUING A FINAL NEGATIVE REPORT     Performed at [facility]   Report Status PENDING    MAGNESIUM     Status: Abnormal   Collection Time    06/17/13  4:59 PM      Result Value Ref Range   Magnesium 1.3 (*) 1.5 - 2.5 mg/dL  TROPONIN I     Status: Abnormal   Collection Time    06/17/13  4:59 PM      Result Value Ref Range   Troponin I 0.51 (*) <0.30 ng/mL   Comment:            Due to the release kinetics of cTnI,     a negative result within the first hours     of the onset of symptoms does not rule out     myocardial infarction with certainty.     If myocardial infarction is still suspected,     repeat the test at appropriate intervals.     CRITICAL VALUE NOTED.  VALUE IS  CONSISTENT WITH PREVIOUSLY REPORTED AND CALLED VALUE.  LACTIC ACID, PLASMA     Status: Abnormal   Collection Time    06/17/13  4:59 PM      Result Value Ref Range   Lactic Acid, Venous 2.7 (*) 0.5 - 2.2 mmol/L  MRSA PCR SCREENING     Status: None   Collection Time    06/17/13  6:53 PM      Result Value Ref Range   MRSA by PCR NEGATIVE  NEGATIVE   Comment:            The GeneXpert MRSA Assay (FDA     approved for NASAL specimens     only), is one component of a     comprehensive MRSA colonization     surveillance program. It is not     intended to diagnose MRSA     infection nor to guide or     monitor treatment for     MRSA infections.  CBC     Status: Abnormal   Collection Time    06/18/13  2:22 AM      Result Value Ref Range   WBC 17.1 (*) 4.0 - 10.5 K/uL   RBC 4.07  3.87 - 5.11 MIL/uL   Hemoglobin 12.0  12.0 - 15.0 g/dL   HCT 35.9 (*) 36.0 - 46.0 %   MCV 88.2  78.0 - 100.0 fL   MCH 29.5  26.0 - 34.0 pg   MCHC 33.4  30.0 - 36.0 g/dL   RDW 15.0  11.5 - 15.5 %   Platelets 186  150 - 400 K/uL  COMPREHENSIVE METABOLIC PANEL     Status: Abnormal   Collection Time    06/18/13  2:22 AM      Result Value Ref Range   Sodium 137  137 -  147 mEq/L   Potassium 4.4  3.7 - 5.3 mEq/L   Comment: DELTA CHECK NOTED   Chloride 104  96 - 112 mEq/L   CO2 19  19 - 32 mEq/L   Glucose, Bld 118 (*) 70 - 99 mg/dL   BUN 12  6 - 23 mg/dL   Creatinine, Ser 0.74  0.50 - 1.10 mg/dL   Calcium 7.3 (*) 8.4 - 10.5 mg/dL   Total Protein 5.2 (*) 6.0 - 8.3 g/dL   Albumin 2.3 (*) 3.5 - 5.2 g/dL   AST 23  0 - 37 U/L   ALT 16  0 - 35 U/L   Alkaline Phosphatase 67  39 - 117 U/L   Total Bilirubin 1.5 (*) 0.3 - 1.2 mg/dL   GFR calc non Af Amer 78 (*) >90 mL/min   GFR calc Af Amer >90  >90 mL/min   Comment: (NOTE)     The eGFR has been calculated using the CKD EPI equation.     This calculation has not been validated in all clinical situations.     eGFR's persistently <90 mL/min signify possible  Chronic Kidney     Disease.  TROPONIN I     Status: Abnormal   Collection Time    06/18/13  2:22 AM      Result Value Ref Range   Troponin I 0.49 (*) <0.30 ng/mL   Comment:            Due to the release kinetics of cTnI,     a negative result within the first hours     of the onset of symptoms does not rule out     myocardial infarction with certainty.     If myocardial infarction is still suspected,     repeat the test at appropriate intervals.     CRITICAL VALUE NOTED.  VALUE IS CONSISTENT WITH PREVIOUSLY REPORTED AND CALLED VALUE.  LACTIC ACID, PLASMA     Status: None   Collection Time    06/18/13  2:22 AM      Result Value Ref Range   Lactic Acid, Venous 1.8  0.5 - 2.2 mmol/L  CBC     Status: Abnormal   Collection Time    06/18/13  5:40 AM      Result Value Ref Range   WBC 16.8 (*) 4.0 - 10.5 K/uL   RBC 4.19  3.87 - 5.11 MIL/uL   Hemoglobin 12.3  12.0 - 15.0 g/dL   HCT 37.0  36.0 - 46.0 %   MCV 88.3  78.0 - 100.0 fL   MCH 29.4  26.0 - 34.0 pg   MCHC 33.2  30.0 - 36.0 g/dL   RDW 15.2  11.5 - 15.5 %   Platelets 189  150 - 400 K/uL  TROPONIN I     Status: Abnormal   Collection Time    06/18/13 10:20 AM      Result Value Ref Range   Troponin I 0.56 (*) <0.30 ng/mL   Comment:            Due to the release kinetics of cTnI,     a negative result within the first hours     of the onset of symptoms does not rule out     myocardial infarction with certainty.     If myocardial infarction is still suspected,     repeat the test at appropriate intervals.     CRITICAL VALUE NOTED.  VALUE IS CONSISTENT WITH PREVIOUSLY  REPORTED AND CALLED VALUE.  CBC     Status: Abnormal   Collection Time    06/18/13 10:32 AM      Result Value Ref Range   WBC 17.3 (*) 4.0 - 10.5 K/uL   RBC 4.09  3.87 - 5.11 MIL/uL   Hemoglobin 12.1  12.0 - 15.0 g/dL   HCT 36.0  36.0 - 46.0 %   MCV 88.0  78.0 - 100.0 fL   MCH 29.6  26.0 - 34.0 pg   MCHC 33.6  30.0 - 36.0 g/dL   RDW 15.2  11.5 - 15.5 %    Platelets 168  150 - 400 K/uL  TROPONIN I     Status: Abnormal   Collection Time    06/18/13  6:24 PM      Result Value Ref Range   Troponin I 0.83 (*) <0.30 ng/mL   Comment:            Due to the release kinetics of cTnI,     a negative result within the first hours     of the onset of symptoms does not rule out     myocardial infarction with certainty.     If myocardial infarction is still suspected,     repeat the test at appropriate intervals.     CRITICAL VALUE NOTED.  VALUE IS CONSISTENT WITH PREVIOUSLY REPORTED AND CALLED VALUE.  CBC     Status: Abnormal   Collection Time    06/18/13  6:24 PM      Result Value Ref Range   WBC 17.3 (*) 4.0 - 10.5 K/uL   RBC 3.95  3.87 - 5.11 MIL/uL   Hemoglobin 11.6 (*) 12.0 - 15.0 g/dL   HCT 34.8 (*) 36.0 - 46.0 %   MCV 88.1  78.0 - 100.0 fL   MCH 29.4  26.0 - 34.0 pg   MCHC 33.3  30.0 - 36.0 g/dL   RDW 15.4  11.5 - 15.5 %   Platelets 178  150 - 400 K/uL  CBC     Status: Abnormal   Collection Time    06/18/13 10:57 PM      Result Value Ref Range   WBC 17.0 (*) 4.0 - 10.5 K/uL   RBC 3.77 (*) 3.87 - 5.11 MIL/uL   Hemoglobin 11.0 (*) 12.0 - 15.0 g/dL   HCT 33.1 (*) 36.0 - 46.0 %   MCV 87.8  78.0 - 100.0 fL   MCH 29.2  26.0 - 34.0 pg   MCHC 33.2  30.0 - 36.0 g/dL   RDW 15.6 (*) 11.5 - 15.5 %   Platelets 187  150 - 400 K/uL  CBC     Status: Abnormal   Collection Time    06/19/13  5:30 AM      Result Value Ref Range   WBC 16.9 (*) 4.0 - 10.5 K/uL   RBC 3.77 (*) 3.87 - 5.11 MIL/uL   Hemoglobin 11.0 (*) 12.0 - 15.0 g/dL   HCT 32.8 (*) 36.0 - 46.0 %   MCV 87.0  78.0 - 100.0 fL   MCH 29.2  26.0 - 34.0 pg   MCHC 33.5  30.0 - 36.0 g/dL   RDW 16.1 (*) 11.5 - 15.5 %   Platelets 183  150 - 400 K/uL  CBC     Status: Abnormal   Collection Time    06/19/13 10:17 AM      Result Value Ref Range   WBC 17.4 (*) 4.0 -  10.5 K/uL   RBC 4.06  3.87 - 5.11 MIL/uL   Hemoglobin 11.9 (*) 12.0 - 15.0 g/dL   HCT 36.0  36.0 - 46.0 %   MCV 88.7  78.0  - 100.0 fL   MCH 29.3  26.0 - 34.0 pg   MCHC 33.1  30.0 - 36.0 g/dL   RDW 15.6 (*) 11.5 - 15.5 %   Platelets 193  150 - 400 K/uL    Imaging: Imaging results have been reviewed  Assessment/Plan:   1. Principal Problem: 2.   Diverticulitis of colon 3. Active Problems: 4.   Hypertension 5.   Thyroid disease 6.   Osteoarthritis 7.   Hypokalemia 8.   Elevated troponin 9.   New left bundle branch block 10.   Asymptomatic bacteriuria 11.   Time Spent Directly with Patient:  25 minutes  Length of Stay:  LOS: 2 days   Abd pain better. Non surgical abd. Taking clear liquids. Trop flat at .5. 2D shows LVEF 45% and anteroseptal HK. ? Related to LBBB. Pt has new onset AFIB with CVC. Getting PRN IV BB. She was on Metop as an OP as well as norvasc. Can't use IV hep given GIB. I suspect this is related to acute illness as well as BB w/d. Will restart PO BB. If she remains in AFIB over the next 24 hours will start Amio IV. She still will require a lexiscan myoview once she has recovered from her acute GI illness.   Danielle [name] 06/19/2013, 10:41 AM

## 2013-06-19 NOTE — Progress Notes (Signed)
Hb OK. Patient examined and I agree with the assessment and plan  Georganna Skeans, MD, MPH, FACS Pager: [phone]  06/19/2013 5:34 PM

## 2013-06-19 NOTE — Progress Notes (Signed)
Dr. Jannifer Hick  Notified of rhythm change .No new orders received .Will continue to monitor patient.

## 2013-06-20 DIAGNOSIS — R7989 Other specified abnormal findings of blood chemistry: Secondary | ICD-10-CM

## 2013-06-20 DIAGNOSIS — R197 Diarrhea, unspecified: Secondary | ICD-10-CM

## 2013-06-20 LAB — BASIC METABOLIC PANEL
BUN: 20 mg/dL (ref 6–23)
CO2: 19 mEq/L (ref 19–32)
Calcium: 7.7 mg/dL — ABNORMAL LOW (ref 8.4–10.5)
Chloride: 107 mEq/L (ref 96–112)
Creatinine, Ser: 0.7 mg/dL (ref 0.50–1.10)
GFR, EST NON AFRICAN AMERICAN: 80 mL/min — AB (ref >90)
Glucose, Bld: 109 mg/dL — ABNORMAL HIGH (ref 70–99)
POTASSIUM: 4.1 meq/L (ref 3.7–5.3)
SODIUM: 138 meq/L (ref 137–147)

## 2013-06-20 LAB — CBC
HCT: 33.9 % — ABNORMAL LOW (ref 36.0–46.0)
HEMOGLOBIN: 11.2 g/dL — AB (ref 12.0–15.0)
MCH: 29.3 pg (ref 26.0–34.0)
MCHC: 33 g/dL (ref 30.0–36.0)
MCV: 88.7 fL (ref 78.0–100.0)
Platelets: 204 10*3/uL (ref 150–400)
RBC: 3.82 MIL/uL — ABNORMAL LOW (ref 3.87–5.11)
RDW: 15.6 % — AB (ref 11.5–15.5)
WBC: 17.5 10*3/uL — ABNORMAL HIGH (ref 4.0–10.5)

## 2013-06-20 LAB — TROPONIN I
Troponin I: <0.3 ng/mL (ref <0.30)
Troponin I: <0.3 ng/mL (ref <0.30)

## 2013-06-20 LAB — MAGNESIUM: Magnesium: 2.1 mg/dL (ref 1.5–2.5)

## 2013-06-20 MED ORDER — METOPROLOL TARTRATE 25 MG PO TABS
25.0000 mg | ORAL_TABLET | Freq: Two times a day (BID) | ORAL | Status: DC
  Administered 2013-06-20 – 2013-06-22 (×4): 25 mg via ORAL
  Filled 2013-06-20 (×5): qty 1

## 2013-06-20 MED ORDER — AMIODARONE HCL IN DEXTROSE 360-4.14 MG/200ML-% IV SOLN: 60.0000 mg/h | INTRAVENOUS | Status: DC

## 2013-06-20 MED ORDER — AMIODARONE HCL IN DEXTROSE 360-4.14 MG/200ML-% IV SOLN: 30.0000 mg/h | INTRAVENOUS | Status: DC

## 2013-06-20 MED ORDER — AMIODARONE LOAD VIA INFUSION: 150.0000 mg | Freq: Once | INTRAVENOUS | Status: DC

## 2013-06-20 MED ORDER — PROMETHAZINE HCL 25 MG/ML IJ SOLN
6.2500 mg | Freq: Three times a day (TID) | INTRAMUSCULAR | Status: DC | PRN
  Administered 2013-06-20 – 2013-06-25 (×8): 6.25 mg via INTRAVENOUS
  Filled 2013-06-20 (×8): qty 1

## 2013-06-20 MED ORDER — AMIODARONE HCL IN DEXTROSE 360-4.14 MG/200ML-% IV SOLN
30.0000 mg/h | INTRAVENOUS | Status: DC
  Administered 2013-06-20: 30 mg/h via INTRAVENOUS
  Filled 2013-06-20 (×2): qty 200

## 2013-06-20 MED ORDER — AMIODARONE IV BOLUS ONLY 150 MG/100ML
150.0000 mg | Freq: Once | INTRAVENOUS | Status: AC
  Administered 2013-06-20: 150 mg via INTRAVENOUS
  Filled 2013-06-20: qty 100

## 2013-06-20 MED ORDER — AMIODARONE HCL IN DEXTROSE 360-4.14 MG/200ML-% IV SOLN
60.0000 mg/h | INTRAVENOUS | Status: AC
  Administered 2013-06-20: 60 mg/h via INTRAVENOUS
  Filled 2013-06-20 (×2): qty 200

## 2013-06-20 NOTE — Progress Notes (Signed)
Patient ID: Danielle [name], female   DOB: [birth year], 80 y.o.   MRN: [id number]   Subjective: Complains of nausea with liquids, no vomiting.  Abdominal pain has improved.  369ml of UOP recorded yesterday.  Per pt, she reports getting up multiple times to void.  Question accurate intake and output measurement.  Normal bun/cr. Had a BM last night.  Objective:  Vital signs:  Filed Vitals:   06/19/13 2154 06/20/13 0359 06/20/13 0500 06/20/13 0721  BP: 150/71 123/54  115/70  Pulse: 112 92  93  Temp:  97.4 F (36.3 C)  98 F (36.7 C)  TempSrc:  Oral  Axillary  Resp: 24 32  26  Height:      Weight:   76.4 kg (168 lb 6.9 oz)   SpO2: 92% 97%  92%    Last BM Date: 06/19/13  Intake/Output   Yesterday:  02/12 0701 - 02/13 0700 In: 1558.3 [P.O.:75; I.V.:983.3; IV Piggyback:500] Out: 353 [Urine:351; Stool:2] This shift:    I/O last 3 completed shifts: In: 2258.3 [P.O.:75; I.V.:1683.3; IV Piggyback:500] Out: 803 [Urine:801; Stool:2]   Physical Exam: General appearance: alert, cooperative and no distress  Resp: clear to auscultation bilaterally  Cardio: S1S2 irregularly irregular. no murmurs, gallops or rubs. trace non pitting pedal edema.  GI: +bs, abdomen is soft, mild generaized tenderness, no rebound tenderness or guarding. Reducible umbilical hernia.   Problem List:   Principal Problem:   Diverticulitis of colon Active Problems:   Hypertension   Thyroid disease   Osteoarthritis   Hypokalemia   Elevated troponin   New left bundle branch block   Asymptomatic bacteriuria   Atrial fibrillation    Results:   Labs: Results for orders placed during the hospital encounter of 06/17/13 (from the past 48 hour(s))  TROPONIN I     Status: Abnormal   Collection Time    06/18/13  6:24 PM      Result Value Ref Range   Troponin I 0.83 (*) <0.30 ng/mL   Comment:            Due to the release kinetics of cTnI,     a negative result within the first hours     of the onset of  symptoms does not rule out     myocardial infarction with certainty.     If myocardial infarction is still suspected,     repeat the test at appropriate intervals.     CRITICAL VALUE NOTED.  VALUE IS CONSISTENT WITH PREVIOUSLY REPORTED AND CALLED VALUE.  CBC     Status: Abnormal   Collection Time    06/18/13  6:24 PM      Result Value Ref Range   WBC 17.3 (*) 4.0 - 10.5 K/uL   RBC 3.95  3.87 - 5.11 MIL/uL   Hemoglobin 11.6 (*) 12.0 - 15.0 g/dL   HCT 34.8 (*) 36.0 - 46.0 %   MCV 88.1  78.0 - 100.0 fL   MCH 29.4  26.0 - 34.0 pg   MCHC 33.3  30.0 - 36.0 g/dL   RDW 15.4  11.5 - 15.5 %   Platelets 178  150 - 400 K/uL  CBC     Status: Abnormal   Collection Time    06/18/13 10:57 PM      Result Value Ref Range   WBC 17.0 (*) 4.0 - 10.5 K/uL   RBC 3.77 (*) 3.87 - 5.11 MIL/uL   Hemoglobin 11.0 (*) 12.0 - 15.0 g/dL   HCT  33.1 (*) 36.0 - 46.0 %   MCV 87.8  78.0 - 100.0 fL   MCH 29.2  26.0 - 34.0 pg   MCHC 33.2  30.0 - 36.0 g/dL   RDW 15.6 (*) 11.5 - 15.5 %   Platelets 187  150 - 400 K/uL  HIV ANTIBODY (ROUTINE TESTING)     Status: None   Collection Time    06/19/13  5:30 AM      Result Value Ref Range   HIV NON REACTIVE  NON REACTIVE   Comment: Performed at [facility]  CBC     Status: Abnormal   Collection Time    06/19/13  5:30 AM      Result Value Ref Range   WBC 16.9 (*) 4.0 - 10.5 K/uL   RBC 3.77 (*) 3.87 - 5.11 MIL/uL   Hemoglobin 11.0 (*) 12.0 - 15.0 g/dL   HCT 32.8 (*) 36.0 - 46.0 %   MCV 87.0  78.0 - 100.0 fL   MCH 29.2  26.0 - 34.0 pg   MCHC 33.5  30.0 - 36.0 g/dL   RDW 16.1 (*) 11.5 - 15.5 %   Platelets 183  150 - 400 K/uL  CBC     Status: Abnormal   Collection Time    06/19/13 10:17 AM      Result Value Ref Range   WBC 17.4 (*) 4.0 - 10.5 K/uL   RBC 4.06  3.87 - 5.11 MIL/uL   Hemoglobin 11.9 (*) 12.0 - 15.0 g/dL   HCT 36.0  36.0 - 46.0 %   MCV 88.7  78.0 - 100.0 fL   MCH 29.3  26.0 - 34.0 pg   MCHC 33.1  30.0 - 36.0 g/dL   RDW 15.6 (*) 11.5 -  15.5 %   Platelets 193  150 - 400 K/uL  TROPONIN I     Status: Abnormal   Collection Time    06/19/13 10:20 AM      Result Value Ref Range   Troponin I 0.46 (*) <0.30 ng/mL   Comment:            Due to the release kinetics of cTnI,     a negative result within the first hours     of the onset of symptoms does not rule out     myocardial infarction with certainty.     If myocardial infarction is still suspected,     repeat the test at appropriate intervals.     CRITICAL VALUE NOTED.  VALUE IS CONSISTENT WITH PREVIOUSLY REPORTED AND CALLED VALUE.     Performed at [facility]  TROPONIN I     Status: Abnormal   Collection Time    06/19/13 10:05 PM      Result Value Ref Range   Troponin I 0.31 (*) <0.30 ng/mL   Comment:            Due to the release kinetics of cTnI,     a negative result within the first hours     of the onset of symptoms does not rule out     myocardial infarction with certainty.     If myocardial infarction is still suspected,     repeat the test at appropriate intervals.     CRITICAL VALUE NOTED.  VALUE IS CONSISTENT WITH PREVIOUSLY REPORTED AND CALLED VALUE.  CBC     Status: Abnormal   Collection Time    06/20/13  4:07 AM  Result Value Ref Range   WBC 17.5 (*) 4.0 - 10.5 K/uL   RBC 3.82 (*) 3.87 - 5.11 MIL/uL   Hemoglobin 11.2 (*) 12.0 - 15.0 g/dL   HCT 33.9 (*) 36.0 - 46.0 %   MCV 88.7  78.0 - 100.0 fL   MCH 29.3  26.0 - 34.0 pg   MCHC 33.0  30.0 - 36.0 g/dL   RDW 15.6 (*) 11.5 - 15.5 %   Platelets 204  150 - 400 K/uL  BASIC METABOLIC PANEL     Status: Abnormal   Collection Time    06/20/13  4:07 AM      Result Value Ref Range   Sodium 138  137 - 147 mEq/L   Potassium 4.1  3.7 - 5.3 mEq/L   Chloride 107  96 - 112 mEq/L   CO2 19  19 - 32 mEq/L   Glucose, Bld 109 (*) 70 - 99 mg/dL   BUN 20  6 - 23 mg/dL   Creatinine, Ser 0.70  0.50 - 1.10 mg/dL   Calcium 7.7 (*) 8.4 - 10.5 mg/dL   GFR calc non Af Amer 80 (*) >90 mL/min    GFR calc Af Amer >90  >90 mL/min   Comment: (NOTE)     The eGFR has been calculated using the CKD EPI equation.     This calculation has not been validated in all clinical situations.     eGFR's persistently <90 mL/min signify possible Chronic Kidney     Disease.  MAGNESIUM     Status: None   Collection Time    06/20/13  4:07 AM      Result Value Ref Range   Magnesium 2.1  1.5 - 2.5 mg/dL  TROPONIN I     Status: None   Collection Time    06/20/13  4:07 AM      Result Value Ref Range   Troponin I <0.30  <0.30 ng/mL   Comment:            Due to the release kinetics of cTnI,     a negative result within the first hours     of the onset of symptoms does not rule out     myocardial infarction with certainty.     If myocardial infarction is still suspected,     repeat the test at appropriate intervals.  TROPONIN I     Status: None   Collection Time    06/20/13  9:26 AM      Result Value Ref Range   Troponin I <0.30  <0.30 ng/mL   Comment:            Due to the release kinetics of cTnI,     a negative result within the first hours     of the onset of symptoms does not rule out     myocardial infarction with certainty.     If myocardial infarction is still suspected,     repeat the test at appropriate intervals.    Imaging / Studies: Dg Chest Port 1 View  06/19/2013   CLINICAL DATA:  Shortness of Breath  EXAM: PORTABLE CHEST - 1 VIEW  COMPARISON:  06/17/2013  FINDINGS: There is central vascular congestion and mild interstitial prominence bilaterally suspicious for mild interstitial edema. Bilateral basilar hazy atelectasis or infiltrate. Cardiomegaly again noted.  IMPRESSION: There is central vascular congestion and mild interstitial prominence bilaterally suspicious for mild interstitial edema. Bilateral basilar hazy atelectasis or infiltrate.  Electronically Signed   By: Lahoma Crocker M.D.   On: 06/19/2013 09:04    Medications / Allergies: per chart  Antibiotics: Anti-infectives    Start     Dose/Rate Route Frequency Ordered Stop   06/19/13 0900  metroNIDAZOLE (FLAGYL) IVPB 500 mg     500 mg 100 mL/hr over 60 Minutes Intravenous Every 8 hours 06/19/13 0834     06/18/13 1215  metroNIDAZOLE (FLAGYL) IVPB 500 mg     500 mg 100 mL/hr over 60 Minutes Intravenous  Once 06/18/13 1134 06/18/13 1327   06/18/13 1000  ceFEPIme (MAXIPIME) 2 g in dextrose 5 % 50 mL IVPB     2 g 100 mL/hr over 30 Minutes Intravenous Every 12 hours 06/18/13 0831     06/18/13 0900  metroNIDAZOLE (FLAGYL) IVPB 500 mg     500 mg 100 mL/hr over 60 Minutes Intravenous  Once 06/18/13 0757 06/18/13 1127   06/18/13 0800  vancomycin (VANCOCIN) IVPB 750 mg/150 ml premix  Status:  Discontinued     750 mg 150 mL/hr over 60 Minutes Intravenous Every 12 hours 06/17/13 1637 06/18/13 0757   06/17/13 2300  ciprofloxacin (CIPRO) IVPB 400 mg  Status:  Discontinued     400 mg 200 mL/hr over 60 Minutes Intravenous Every 12 hours 06/17/13 1852 06/17/13 1853   06/17/13 2000  metroNIDAZOLE (FLAGYL) IVPB 500 mg  Status:  Discontinued     500 mg 100 mL/hr over 60 Minutes Intravenous Every 8 hours 06/17/13 1852 06/17/13 1853   06/17/13 1730  vancomycin (VANCOCIN) 1,500 mg in sodium chloride 0.9 % 500 mL IVPB     1,500 mg 250 mL/hr over 120 Minutes Intravenous  Once 06/17/13 1637 06/17/13 2114   06/17/13 1700  piperacillin-tazobactam (ZOSYN) IVPB 3.375 g  Status:  Discontinued     3.375 g 12.5 mL/hr over 240 Minutes Intravenous 3 times per day 06/17/13 1635 06/18/13 0757   06/17/13 1100  ciprofloxacin (CIPRO) IVPB 400 mg     400 mg 200 mL/hr over 60 Minutes Intravenous  Once 06/17/13 1046 06/17/13 1315   06/17/13 1100  metroNIDAZOLE (FLAGYL) IVPB 500 mg     500 mg 100 mL/hr over 60 Minutes Intravenous  Once 06/17/13 1046 06/17/13 1544      Assessment:  Abdominal pain  Leukocytosis  sigmoid colitis, possible diverticulitis  Elevated troponins  Lactic acidosis  Positive FOBT  AF, New LBBB-cards following   Hx LGI bleed 2012 Plan: Non surgical abdomen.  Improved abdominal pain, now more intermittent with oral intake, associated with nausea.  Would not advance diet until symptoms improve.  hemoglobin remains stable.  Continue IV antibiotics, IV hydration, measure I&Os.   Erby Pian, [facility] Surgery Pager [phone] Office [phone]  06/20/2013 10:49 AM

## 2013-06-20 NOTE — Progress Notes (Addendum)
Internal Medicine Teaching Service Daily Progress Note  Subjective: Continues to have some mild abdominal pain.  Did not some nausea this AM, currently on clear liquid diet. She reports she still has a little bit of SOB. She remains in Afib on telemetry.  Patient does note diarrhea overnight. Objective: Vital signs in last 24 hours: Filed Vitals:   06/20/13 0359 06/20/13 0500 06/20/13 0721 06/20/13 1101  BP: 123/54  115/70 142/86  Pulse: 92  93 98  Temp: 97.4 F (36.3 C)  98 F (36.7 C)   TempSrc: Oral  Axillary   Resp: 32  26   Height:      Weight:  168 lb 6.9 oz (76.4 kg)    SpO2: 97%  92%    Weight change: 1 lb 15.7 oz (0.9 kg)  Intake/Output Summary (Last 24 hours) at 06/20/13 1344 Last data filed at 06/20/13 0400  Gross per 24 hour  Intake    400 ml  Output    151 ml  Net    249 ml   General: resting in bed HEENT: EOMI, no scleral icterus Cardiac: Irreguarly Irregular, 2/6 systolic murmur Pulm: clear to auscultation bilaterally Abd: soft, Mild TTP in RUQ, RLQ, LLQ, mild distension, BS present Ext: warm and well perfused, no pedal edema Neuro: alert and oriented X3, cranial nerves II-XII grossly intact  Lab Results: Basic Metabolic Panel:  Recent Labs Lab 06/17/13 1659 06/18/13 0222 06/20/13 0407  NA  --  137 138  K  --  4.4 4.1  CL  --  104 107  CO2  --  19 19  GLUCOSE  --  118* 109*  BUN  --  12 20  CREATININE  --  0.74 0.70  CALCIUM  --  7.3* 7.7*  MG 1.3*  --  2.1   Liver Function Tests:  Recent Labs Lab 06/17/13 0845 06/18/13 0222  AST 20 23  ALT 18 16  ALKPHOS 72 67  BILITOT 1.0 1.5*  PROT 5.8* 5.2*  ALBUMIN 2.8* 2.3*    Recent Labs Lab 06/17/13 0845  LIPASE 26   CBC:  Recent Labs Lab 06/17/13 0845  06/19/13 1017 06/20/13 0407  WBC 19.7*  < > 17.4* 17.5*  NEUTROABS 17.2*  --   --   --   HGB 12.8  < > 11.9* 11.2*  HCT 38.5  < > 36.0 33.9*  MCV 87.3  < > 88.7 88.7  PLT 210  < > 193 204  < > = values in this interval not  displayed. Cardiac Enzymes:  Recent Labs Lab 06/19/13 2205 06/20/13 0407 06/20/13 0926  TROPONINI 0.31* <0.30 <0.30   Urinalysis:  Recent Labs Lab 06/17/13 0935  COLORURINE YELLOW  LABSPEC 1.013  PHURINE 5.5  GLUCOSEU NEGATIVE  HGBUR TRACE*  BILIRUBINUR NEGATIVE  KETONESUR 15*  PROTEINUR NEGATIVE  UROBILINOGEN 0.2  NITRITE NEGATIVE  LEUKOCYTESUR TRACE*    Micro Results: Recent Results (from the past 240 hour(s))  CULTURE, BLOOD (ROUTINE X 2)     Status: None   Collection Time    06/17/13  4:45 PM      Result Value Ref Range Status   Specimen Description BLOOD RIGHT HAND   Final   Special Requests BOTTLES DRAWN AEROBIC AND ANAEROBIC 10CC   Final   Culture  Setup Time     Final   Value: 06/17/2013 22:11     Performed at [facility]   Culture     Final   Value:  BLOOD CULTURE RECEIVED NO GROWTH TO DATE CULTURE WILL BE HELD FOR 5 DAYS BEFORE ISSUING A FINAL NEGATIVE REPORT     Performed at [facility]   Report Status PENDING   Incomplete  CULTURE, BLOOD (ROUTINE X 2)     Status: None   Collection Time    06/17/13  4:54 PM      Result Value Ref Range Status   Specimen Description BLOOD LEFT HAND   Final   Special Requests BOTTLES DRAWN AEROBIC ONLY 5CC   Final   Culture  Setup Time     Final   Value: 06/17/2013 22:11     Performed at [facility]   Culture     Final   Value:        BLOOD CULTURE RECEIVED NO GROWTH TO DATE CULTURE WILL BE HELD FOR 5 DAYS BEFORE ISSUING A FINAL NEGATIVE REPORT     Performed at [facility]   Report Status PENDING   Incomplete  MRSA PCR SCREENING     Status: None   Collection Time    06/17/13  6:53 PM      Result Value Ref Range Status   MRSA by PCR NEGATIVE  NEGATIVE Final   Comment:            The GeneXpert MRSA Assay (FDA     approved for NASAL specimens     only), is one component of a     comprehensive MRSA colonization     surveillance program. It is not     intended to diagnose  MRSA     infection nor to guide or     monitor treatment for     MRSA infections.   Studies/Results: Dg Chest Port 1 View  06/19/2013   CLINICAL DATA:  Shortness of Breath  EXAM: PORTABLE CHEST - 1 VIEW  COMPARISON:  06/17/2013  FINDINGS: There is central vascular congestion and mild interstitial prominence bilaterally suspicious for mild interstitial edema. Bilateral basilar hazy atelectasis or infiltrate. Cardiomegaly again noted.  IMPRESSION: There is central vascular congestion and mild interstitial prominence bilaterally suspicious for mild interstitial edema. Bilateral basilar hazy atelectasis or infiltrate.   Electronically Signed   By: Lahoma Crocker M.D.   On: 06/19/2013 09:04   Medications: I have reviewed the patient's current medications. Scheduled Meds: . ceFEPime (MAXIPIME) IV  2 g Intravenous Q12H  . levothyroxine  100 mcg Oral Once per day on Sun Tue Wed Fri Sat  . metoprolol tartrate  25 mg Oral BID  . metronidazole  500 mg Intravenous Q8H  . pantoprazole  40 mg Oral Daily  . sodium chloride  3 mL Intravenous Q12H   Continuous Infusions:   PRN Meds:.acetaminophen, metoprolol, morphine injection, promethazine Assessment/Plan:  New onset Atrial Fibrillation - Rate control: Metoprolol 12.5 BID - Anticoagulation Not a candidate due to GIB  - CHA2DS2-VASc score = 4 - Will speak to cardiology about potential for amiodarone as patient in <48 hours in afib for chemical cardioversion.     Diverticulitis of colon- Improving - Will continue clear liquid diet - Abx Cefepime and flagyl - CBC stable - PT/ OT to get out of bed and treat.    NSTEMI with new LBBB - Cardiology on board, no current chest pain.  Trops have trended down to neg. - Cardiology plans for lexiscan when stable. (Although patient may not been good candidate for stent placement and antiplatelet Tx given comorbidity and recurrent GIB) -Not candidate for  heparin/ASA due to acute GIB - Patient on  Metoprolol  Diarrhea -Check C diff at high risk given PPI and abx.    Hypertension - Antihypertensives held given Acute GIB, hypotension. - Metoprolol 12.5mg  BID for Afib.    Thyroid disease  IV synthroid    Hypokalemia- resolved Hypomagnesemia- resolved  Diet: Clears VTE PPx: SCDs Code status: Full, did speak with patient and husband today about her growing list of medical issues that that with further interventions in these problems may exacerbate other medical issues (ie Stent placement for CAD requiring antiplatelet therapy may cause more severe GI bleed) I encouraged them to have a discussion about what interventions she may want and her wishes should she have an adverse outcome.  She does want to remain a full code with intubation, although she does note that she would not want to be on a ventilator for a prolonged period of time and would not want a tracheostomy. Dispo: Disposition is deferred at this time, awaiting improvement of current medical problems.    The patient does have a current PCP (Richard Marcia Brash, MD) and does not need an [facility] hospital follow-up appointment after discharge.  The patient does not have transportation limitations that hinder transportation to clinic appointments.  .Services Needed at time of discharge: Y = Yes, Blank = No PT:   OT:   RN:   Equipment:   Other:     LOS: 3 days   Joni Reining, DO 06/20/2013, 1:44 PM   Date: 06/20/2013  Patient name: Danielle [name]  Medical record number: [id number]  Date of birth: 1930/12/29   This patient has been seen and the plan of care was discussed with the house staff. Please see their note for complete details. I concur with their findings with the following additions/corrections:  Patient complains of more abdominal pain today on exam that she had yesterday. She has more tenderness as well. She has not had further significant bleeds. She has had trouble with her atrial fibrillation and rapid  ventricular response. Metoprolol is being up titrated.  She is to have [place] when acute issues have resolved but I wonder if she would be a good candidate for either bypass grafting OR even stent placement given need for plavix/anti platelet therapy and given her recent and past lower GIB  I would continue her current antibiotic regimen of cefepime and Flagyl.  Greatly appreciate general surgery and cardiology's help with this complicated patient.     Truman Hayward, MD 06/20/2013, 3:16 PM

## 2013-06-20 NOTE — Progress Notes (Signed)
Physical Therapy Treatment Patient Details Name: Danielle [name] MRN: [id number] DOB: April 24, 1931 Today's Date: 06/20/2013 Time: 0832-0900 PT Time Calculation (min): 28 min  PT Assessment / Plan / Recommendation  History of Present Illness Danielle [name] is a 80yo woman with history of HTN & hypothyroidism (s/p thyroidectomy d/t thyroid Ca) who presents with 2 days of worsening upper abdominal pain described as 10/10, sore and crampy.  Not similar to anything she has experienced in the past, but was hospitalized in 2012 with diverticulitis. Pain was severe to the point of passing out last night.  She had an episode of emesis at 1 am (unclear if bloody) and husband reports another episode since arrival at the hospital. She reports non bloody diarrhea, but unable to quantify number of episodes or when it started.  [name]. Denies fever/chills. Denies chest pain, SOB, palpitations. Reports lightheadedness/dizziness. Denies NSAID use.  She also reports she is not taking a PPI.   PT Comments   Pt self-limiting throughout session. Pt demo decreased overall activity tolerance and overall deconditioning. Pt husband present throughout session. Both of them very concerned about mobility and lack of strength at this time. Husband cannot provide physical (A) at home due to his medical conditions. D/C disposition updated to SNF for post acute rehab prior to returning home.   Follow Up Recommendations  SNF;Supervision/Assistance - 24 hour     Does the patient have the potential to tolerate intense rehabilitation     Barriers to Discharge        Equipment Recommendations       Recommendations for Other Services    Frequency Min 2X/week   Progress towards PT Goals Progress towards PT goals: Progressing toward goals  Plan Discharge plan needs to be updated    Precautions / Restrictions Precautions Precautions: Fall Precaution Comments: denies any recent  falls Restrictions Weight Bearing Restrictions: No   Pertinent Vitals/Pain C/o pain 510 in abdomen; RN medicated pt during session.     Mobility  Bed Mobility Overal bed mobility: Modified Independent Bed Mobility: Sit to Supine;Supine to Sit General bed mobility comments: incr time required and HOB elevated   Transfers Overall transfer level: Needs assistance Equipment used: Rolling walker (2 wheeled) Transfers: Sit to/from Stand Sit to Stand: Min guard General transfer comment: cues for hand placement and RW management; pt tends to pull up on RW to stand and flops down into the bed with decr safety awareness; max cues for safety and to control descent  Ambulation/Gait Ambulation/Gait assistance: Min guard Ambulation Distance (Feet): 80 Feet Assistive device: Rolling walker (2 wheeled) Gait Pattern/deviations: Step-through pattern;Decreased stride length;Narrow base of support Gait velocity: decreased due to pain; guarded Gait velocity interpretation: Below normal speed for age/gender General Gait Details: max encouragement required to incr ambulation; min guard to steady and manage RW: multiple standing rest breaks and very decr gt speed; pt is a fall risk          PT Diagnosis:    PT Problem List:   PT Treatment Interventions:     PT Goals (current goals can now be found in the care plan section) Acute Rehab PT Goals Patient Stated Goal: to get this pain to go away so i can go home PT Goal Formulation: With patient Time For Goal Achievement: 07/03/13 Potential to Achieve Goals: Good  Visit Information  Last PT Received On: 06/20/13 Assistance Needed: +1 History of Present Illness: Danielle [name] is a 80yo  woman with history of HTN & hypothyroidism (s/p thyroidectomy d/t thyroid Ca) who presents with 2 days of worsening upper abdominal pain described as 10/10, sore and crampy.  Not similar to anything she has experienced in the past, but was hospitalized in 2012 with  diverticulitis. Pain was severe to the point of passing out last night.  She had an episode of emesis at 1 am (unclear if bloody) and husband reports another episode since arrival at the hospital. She reports non bloody diarrhea, but unable to quantify number of episodes or when it started.  [name]. Denies fever/chills. Denies chest pain, SOB, palpitations. Reports lightheadedness/dizziness. Denies NSAID use.  She also reports she is not taking a PPI.    Subjective Data  Subjective: "i just dont know if i can do it. im so tired and weak"  Patient Stated Goal: to get this pain to go away so i can go home   Cognition  Cognition Arousal/Alertness: Awake/alert Behavior During Therapy: WFL for tasks assessed/performed Overall Cognitive Status: Within Functional Limits for tasks assessed    Balance  Balance Overall balance assessment: Needs assistance Sitting-balance support: Feet supported;Single extremity supported Sitting balance-Leahy Scale: Good Sitting balance - Comments: sat EOB ~ 5 min to take medications  Standing balance support: During functional activity;Bilateral upper extremity supported Standing balance-Leahy Scale: Poor Standing balance comment: pt bracing LEs against bed with static standing; stood ~5 min prior to ambulation; unable to let go of RW to reach for medications due to feeling "unsteady"   End of Session PT - End of Session Equipment Utilized During Treatment: Gait belt Activity Tolerance: Patient limited by pain;Patient limited by fatigue Patient left: in bed;with call bell/phone within reach;with family/visitor present;with nursing/sitter in room Nurse Communication: Mobility status   GP     Gustavus Bryant, [place] 06/20/2013, 10:06 AM

## 2013-06-20 NOTE — Progress Notes (Signed)
Subjective:  Still c/o mild abd pain and difficulty eating. No CP/SOB  Objective:  Temp:  [97.4 F (36.3 C)-98 F (36.7 C)] 98 F (36.7 C) (02/13 0721) Pulse Rate:  [89-116] 98 (02/13 1101) Resp:  [24-38] 26 (02/13 0721) BP: (115-150)/(54-86) 142/86 mmHg (02/13 1101) SpO2:  [89 %-97 %] 92 % (02/13 0721) Weight:  [168 lb 6.9 oz (76.4 kg)] 168 lb 6.9 oz (76.4 kg) (02/13 0500) Weight change: 1 lb 15.7 oz (0.9 kg)  Intake/Output from previous day: 02/12 0701 - 02/13 0700 In: 1558.3 [P.O.:75; I.V.:983.3; IV Piggyback:500] Out: 353 [Urine:351; Stool:2]  Intake/Output from this shift:    Physical Exam: General appearance: alert and no distress Neck: no adenopathy, no carotid bruit, no JVD, supple, symmetrical, trachea midline and thyroid not enlarged, symmetric, no tenderness/mass/nodules Lungs: clear to auscultation bilaterally Heart: regularly irregular rhythm Abdomen: mildly trender to palb with decreased BS Extremities: extremities normal, atraumatic, no cyanosis or edema  Lab Results: Results for orders placed during the hospital encounter of 06/17/13 (from the past 48 hour(s))  TROPONIN I     Status: Abnormal   Collection Time    06/18/13  6:24 PM      Result Value Ref Range   Troponin I 0.83 (*) <0.30 ng/mL   Comment:            Due to the release kinetics of cTnI,     a negative result within the first hours     of the onset of symptoms does not rule out     myocardial infarction with certainty.     If myocardial infarction is still suspected,     repeat the test at appropriate intervals.     CRITICAL VALUE NOTED.  VALUE IS CONSISTENT WITH PREVIOUSLY REPORTED AND CALLED VALUE.  CBC     Status: Abnormal   Collection Time    06/18/13  6:24 PM      Result Value Ref Range   WBC 17.3 (*) 4.0 - 10.5 K/uL   RBC 3.95  3.87 - 5.11 MIL/uL   Hemoglobin 11.6 (*) 12.0 - 15.0 g/dL   HCT 34.8 (*) 36.0 - 46.0 %   MCV 88.1  78.0 - 100.0 fL   MCH 29.4  26.0 - 34.0 pg   MCHC 33.3  30.0 - 36.0 g/dL   RDW 15.4  11.5 - 15.5 %   Platelets 178  150 - 400 K/uL  CBC     Status: Abnormal   Collection Time    06/18/13 10:57 PM      Result Value Ref Range   WBC 17.0 (*) 4.0 - 10.5 K/uL   RBC 3.77 (*) 3.87 - 5.11 MIL/uL   Hemoglobin 11.0 (*) 12.0 - 15.0 g/dL   HCT 33.1 (*) 36.0 - 46.0 %   MCV 87.8  78.0 - 100.0 fL   MCH 29.2  26.0 - 34.0 pg   MCHC 33.2  30.0 - 36.0 g/dL   RDW 15.6 (*) 11.5 - 15.5 %   Platelets 187  150 - 400 K/uL  HIV ANTIBODY (ROUTINE TESTING)     Status: None   Collection Time    06/19/13  5:30 AM      Result Value Ref Range   HIV NON REACTIVE  NON REACTIVE   Comment: Performed at [facility]  CBC     Status: Abnormal   Collection Time    06/19/13  5:30 AM      Result Value  Ref Range   WBC 16.9 (*) 4.0 - 10.5 K/uL   RBC 3.77 (*) 3.87 - 5.11 MIL/uL   Hemoglobin 11.0 (*) 12.0 - 15.0 g/dL   HCT 32.8 (*) 36.0 - 46.0 %   MCV 87.0  78.0 - 100.0 fL   MCH 29.2  26.0 - 34.0 pg   MCHC 33.5  30.0 - 36.0 g/dL   RDW 16.1 (*) 11.5 - 15.5 %   Platelets 183  150 - 400 K/uL  CBC     Status: Abnormal   Collection Time    06/19/13 10:17 AM      Result Value Ref Range   WBC 17.4 (*) 4.0 - 10.5 K/uL   RBC 4.06  3.87 - 5.11 MIL/uL   Hemoglobin 11.9 (*) 12.0 - 15.0 g/dL   HCT 36.0  36.0 - 46.0 %   MCV 88.7  78.0 - 100.0 fL   MCH 29.3  26.0 - 34.0 pg   MCHC 33.1  30.0 - 36.0 g/dL   RDW 15.6 (*) 11.5 - 15.5 %   Platelets 193  150 - 400 K/uL  TROPONIN I     Status: Abnormal   Collection Time    06/19/13 10:20 AM      Result Value Ref Range   Troponin I 0.46 (*) <0.30 ng/mL   Comment:            Due to the release kinetics of cTnI,     a negative result within the first hours     of the onset of symptoms does not rule out     myocardial infarction with certainty.     If myocardial infarction is still suspected,     repeat the test at appropriate intervals.     CRITICAL VALUE NOTED.  VALUE IS CONSISTENT WITH PREVIOUSLY REPORTED AND  CALLED VALUE.     Performed at [facility]  TROPONIN I     Status: Abnormal   Collection Time    06/19/13 10:05 PM      Result Value Ref Range   Troponin I 0.31 (*) <0.30 ng/mL   Comment:            Due to the release kinetics of cTnI,     a negative result within the first hours     of the onset of symptoms does not rule out     myocardial infarction with certainty.     If myocardial infarction is still suspected,     repeat the test at appropriate intervals.     CRITICAL VALUE NOTED.  VALUE IS CONSISTENT WITH PREVIOUSLY REPORTED AND CALLED VALUE.  CBC     Status: Abnormal   Collection Time    06/20/13  4:07 AM      Result Value Ref Range   WBC 17.5 (*) 4.0 - 10.5 K/uL   RBC 3.82 (*) 3.87 - 5.11 MIL/uL   Hemoglobin 11.2 (*) 12.0 - 15.0 g/dL   HCT 33.9 (*) 36.0 - 46.0 %   MCV 88.7  78.0 - 100.0 fL   MCH 29.3  26.0 - 34.0 pg   MCHC 33.0  30.0 - 36.0 g/dL   RDW 15.6 (*) 11.5 - 15.5 %   Platelets 204  150 - 400 K/uL  BASIC METABOLIC PANEL     Status: Abnormal   Collection Time    06/20/13  4:07 AM      Result Value Ref Range   Sodium 138  137 -  147 mEq/L   Potassium 4.1  3.7 - 5.3 mEq/L   Chloride 107  96 - 112 mEq/L   CO2 19  19 - 32 mEq/L   Glucose, Bld 109 (*) 70 - 99 mg/dL   BUN 20  6 - 23 mg/dL   Creatinine, Ser 0.70  0.50 - 1.10 mg/dL   Calcium 7.7 (*) 8.4 - 10.5 mg/dL   GFR calc non Af Amer 80 (*) >90 mL/min   GFR calc Af Amer >90  >90 mL/min   Comment: (NOTE)     The eGFR has been calculated using the CKD EPI equation.     This calculation has not been validated in all clinical situations.     eGFR's persistently <90 mL/min signify possible Chronic Kidney     Disease.  MAGNESIUM     Status: None   Collection Time    06/20/13  4:07 AM      Result Value Ref Range   Magnesium 2.1  1.5 - 2.5 mg/dL  TROPONIN I     Status: None   Collection Time    06/20/13  4:07 AM      Result Value Ref Range   Troponin I <0.30  <0.30 ng/mL   Comment:             Due to the release kinetics of cTnI,     a negative result within the first hours     of the onset of symptoms does not rule out     myocardial infarction with certainty.     If myocardial infarction is still suspected,     repeat the test at appropriate intervals.  TROPONIN I     Status: None   Collection Time    06/20/13  9:26 AM      Result Value Ref Range   Troponin I <0.30  <0.30 ng/mL   Comment:            Due to the release kinetics of cTnI,     a negative result within the first hours     of the onset of symptoms does not rule out     myocardial infarction with certainty.     If myocardial infarction is still suspected,     repeat the test at appropriate intervals.    Imaging: Imaging results have been reviewed  Assessment/Plan:   1. Principal Problem: 2.   Diverticulitis of colon 3. Active Problems: 4.   Hypertension 5.   Thyroid disease 6.   Osteoarthritis 7.   Hypokalemia 8.   Elevated troponin 9.   New left bundle branch block 10.   Asymptomatic bacteriuria 11.   Atrial fibrillation 12.   Time Spent Directly with Patient:  30 minutes  Length of Stay:  LOS: 3 days   Remains in AFIB with increased VR. I'm hesitant to anticoagulate her given her recent GIB and therefore can't start amio. She is tolerating the AFIB at this point. Will increase BB. Obtain a lexiscan myoview once GI issues resolve.   Lorretta Harp 06/20/2013, 12:28 PM

## 2013-06-20 NOTE — Evaluation (Signed)
Occupational Therapy Evaluation Patient Details Name: Danielle [name] MRN: [id number] DOB: 04-24-1931 Today's Date: 06/20/2013 Time: 7169-6789 OT Time Calculation (min): 14 min  OT Assessment / Plan / Recommendation History of present illness Danielle [name] is a 80yo woman with history of HTN & hypothyroidism (s/p thyroidectomy d/t thyroid Ca) who presents with 2 days of worsening upper abdominal pain described as 10/10, sore and crampy.  Not similar to anything she has experienced in the past, but was hospitalized in 2012 with diverticulitis. Pain was severe to the point of passing out last night.  She had an episode of emesis at 1 am (unclear if bloody) and husband reports another episode since arrival at the hospital. She reports non bloody diarrhea, but unable to quantify number of episodes or when it started.  [name]. Denies fever/chills. Denies chest pain, SOB, palpitations. Reports lightheadedness/dizziness. Denies NSAID use.  She also reports she is not taking a PPI.   Clinical Impression   Pt admitted with above.    OT Assessment  Patient needs continued OT Services    Follow Up Recommendations  SNF;Supervision/Assistance - 24 hour    Barriers to Discharge      Equipment Recommendations   (TBD next venue)    Recommendations for Other Services    Frequency  Min 2X/week    Precautions / Restrictions Precautions Precautions: Fall Precaution Comments: denies any recent falls   Pertinent Vitals/Pain See vitals    ADL  Grooming: Performed;Wash/dry hands;Wash/dry face;Min guard Where Assessed - Grooming: Unsupported standing Toilet Transfer: Simulated;Min Psychiatric nurse Method: Sit to Loss adjuster, chartered:  (bed) Equipment Used: Gait belt;Rolling walker Transfers/Ambulation Related to ADLs: Min guard ~12 ft, bed<>sink.  ADL Comments: Incr time and effort for all mobility tasks.    OT Diagnosis: Generalized  weakness;Acute pain  OT Problem List: Decreased strength;Decreased activity tolerance;Impaired balance (sitting and/or standing);Decreased knowledge of use of DME or AE;Pain OT Treatment Interventions: Self-care/ADL training;DME and/or AE instruction;Therapeutic activities;Patient/family education;Balance training   OT Goals(Current goals can be found in the care plan section) Acute Rehab OT Goals Patient Stated Goal: to get pain under control OT Goal Formulation: With patient Time For Goal Achievement: 06/27/13 Potential to Achieve Goals: Good  Visit Information  Last OT Received On: 06/20/13 Assistance Needed: +1 History of Present Illness: Danielle [name] is a 80yo woman with history of HTN & hypothyroidism (s/p thyroidectomy d/t thyroid Ca) who presents with 2 days of worsening upper abdominal pain described as 10/10, sore and crampy.  Not similar to anything she has experienced in the past, but was hospitalized in 2012 with diverticulitis. Pain was severe to the point of passing out last night.  She had an episode of emesis at 1 am (unclear if bloody) and husband reports another episode since arrival at the hospital. She reports non bloody diarrhea, but unable to quantify number of episodes or when it started.  [name]. Denies fever/chills. Denies chest pain, SOB, palpitations. Reports lightheadedness/dizziness. Denies NSAID use.  She also reports she is not taking a PPI.       Prior [place] expects to be discharged to:: Private residence Living Arrangements: Spouse/significant other Available Help at Discharge: Family;Available 24 hours/day Type of Home: House Home Access: Ramped entrance Home Layout: One level Home Equipment: None Prior Function Level of Independence: Independent Communication Communication: No difficulties  Vision/Perception     Cognition  Cognition Arousal/Alertness:  Awake/alert Behavior During Therapy: WFL for tasks assessed/performed Overall Cognitive Status: Within Functional Limits for tasks assessed    Extremity/Trunk Assessment Upper Extremity Assessment Upper Extremity Assessment: Overall WFL for tasks assessed     Mobility Bed Mobility Overal bed mobility: Modified Independent Bed Mobility: Supine to Sit;Sit to Supine Supine to sit: Modified independent (Device/Increase time) Sit to supine: Modified independent (Device/Increase time) General bed mobility comments: incr time Transfers Overall transfer level: Needs assistance Transfers: Sit to/from Stand Sit to Stand: Min guard General transfer comment: Max VCs for safe hand placement.     Exercise     Balance     End of Session OT - End of Session Equipment Utilized During Treatment: Rolling walker;Gait belt Activity Tolerance: Patient limited by fatigue Patient left: in bed;with call bell/phone within reach  GO    06/20/2013 Darrol Jump OTR/L Pager [phone] Office [phone]  Darrol Jump 06/20/2013, 3:03 PM

## 2013-06-20 NOTE — Progress Notes (Signed)
Hemoglobin remained stable. Await improvement of symptoms prior to advancing diet. Patient examined and I agree with the assessment and plan  Georganna Skeans, MD, MPH, FACS Pager: [phone]  06/20/2013 6:02 PM

## 2013-06-21 LAB — BASIC METABOLIC PANEL
BUN: 17 mg/dL (ref 6–23)
CHLORIDE: 104 meq/L (ref 96–112)
CO2: 20 mEq/L (ref 19–32)
Calcium: 7.8 mg/dL — ABNORMAL LOW (ref 8.4–10.5)
Creatinine, Ser: 0.67 mg/dL (ref 0.50–1.10)
GFR calc Af Amer: >90 mL/min (ref >90)
GFR calc non Af Amer: 81 mL/min — ABNORMAL LOW (ref >90)
Glucose, Bld: 105 mg/dL — ABNORMAL HIGH (ref 70–99)
Potassium: 3.7 mEq/L (ref 3.7–5.3)
Sodium: 137 mEq/L (ref 137–147)

## 2013-06-21 LAB — CBC
HCT: 34.2 % — ABNORMAL LOW (ref 36.0–46.0)
Hemoglobin: 11.3 g/dL — ABNORMAL LOW (ref 12.0–15.0)
MCH: 28.9 pg (ref 26.0–34.0)
MCHC: 33 g/dL (ref 30.0–36.0)
MCV: 87.5 fL (ref 78.0–100.0)
PLATELETS: 253 10*3/uL (ref 150–400)
RBC: 3.91 MIL/uL (ref 3.87–5.11)
RDW: 15.4 % (ref 11.5–15.5)
WBC: 14.4 10*3/uL — ABNORMAL HIGH (ref 4.0–10.5)

## 2013-06-21 LAB — CLOSTRIDIUM DIFFICILE BY PCR: CDIFFPCR: NEGATIVE

## 2013-06-21 MED ORDER — PHENOL 1.4 % MT LIQD
1.0000 | OROMUCOSAL | Status: DC | PRN
  Administered 2013-06-21: 1 via OROMUCOSAL

## 2013-06-21 MED ORDER — DIAZEPAM 2 MG PO TABS
2.0000 mg | ORAL_TABLET | Freq: Two times a day (BID) | ORAL | Status: DC | PRN
  Administered 2013-06-21 – 2013-06-23 (×3): 2 mg via ORAL
  Filled 2013-06-21 (×4): qty 1

## 2013-06-21 MED ORDER — FUROSEMIDE 10 MG/ML IJ SOLN
40.0000 mg | Freq: Once | INTRAMUSCULAR | Status: AC
  Administered 2013-06-21: 40 mg via INTRAVENOUS
  Filled 2013-06-21: qty 4

## 2013-06-21 MED ORDER — MAGIC MOUTHWASH
5.0000 mL | Freq: Three times a day (TID) | ORAL | Status: DC | PRN
  Administered 2013-06-21 – 2013-06-23 (×2): 5 mL via ORAL
  Filled 2013-06-21 (×3): qty 5

## 2013-06-21 NOTE — Progress Notes (Signed)
Internal Medicine Teaching Service Daily Progress Note  Subjective: Abdominal pain continues to improve. She remains mildly short of breath. Objective: Vital signs in last 24 hours: Filed Vitals:   06/21/13 0500 06/21/13 0600 06/21/13 0640 06/21/13 0800  BP: 146/85 142/75  146/91  Pulse: 95 95 90 100  Temp:    98.6 F (37 C)  TempSrc:    Oral  Resp: 30 27 27 30   Height:      Weight: 168 lb 10.4 oz (76.5 kg)     SpO2: 97% 97% 97% 93%   Weight change: 3.5 oz (0.1 kg)  Intake/Output Summary (Last 24 hours) at 06/21/13 1150 Last data filed at 06/21/13 1100  Gross per 24 hour  Intake  823.1 ml  Output    451 ml  Net  372.1 ml   General: resting in bed HEENT: EOMI, no scleral icterus Cardiac: Irreguarly Irregular, 2/6 systolic murmur Pulm: clear to auscultation bilaterally, mild decreased bibasilar breath sounds Abd: soft, Mild TTP in RUQ, RLQ, LLQ, non distended, BS present Ext: warm and well perfused, no pedal edema Neuro: alert and oriented X3, cranial nerves II-XII grossly intact  Lab Results: Basic Metabolic Panel:  Recent Labs Lab 06/17/13 1659  06/20/13 0407 06/21/13 0305  NA  --   < > 138 137  K  --   < > 4.1 3.7  CL  --   < > 107 104  CO2  --   < > 19 20  GLUCOSE  --   < > 109* 105*  BUN  --   < > 20 17  CREATININE  --   < > 0.70 0.67  CALCIUM  --   < > 7.7* 7.8*  MG 1.3*  --  2.1  --   < > = values in this interval not displayed. Liver Function Tests:  Recent Labs Lab 06/17/13 0845 06/18/13 0222  AST 20 23  ALT 18 16  ALKPHOS 72 67  BILITOT 1.0 1.5*  PROT 5.8* 5.2*  ALBUMIN 2.8* 2.3*    Recent Labs Lab 06/17/13 0845  LIPASE 26   CBC:  Recent Labs Lab 06/17/13 0845  06/20/13 0407 06/21/13 0305  WBC 19.7*  < > 17.5* 14.4*  NEUTROABS 17.2*  --   --   --   HGB 12.8  < > 11.2* 11.3*  HCT 38.5  < > 33.9* 34.2*  MCV 87.3  < > 88.7 87.5  PLT 210  < > 204 253  < > = values in this interval not displayed. Cardiac Enzymes:  Recent  Labs Lab 06/19/13 2205 06/20/13 0407 06/20/13 0926  TROPONINI 0.31* <0.30 <0.30   Urinalysis:  Recent Labs Lab 06/17/13 0935  COLORURINE YELLOW  LABSPEC 1.013  PHURINE 5.5  GLUCOSEU NEGATIVE  HGBUR TRACE*  BILIRUBINUR NEGATIVE  KETONESUR 15*  PROTEINUR NEGATIVE  UROBILINOGEN 0.2  NITRITE NEGATIVE  LEUKOCYTESUR TRACE*    Micro Results: Recent Results (from the past 240 hour(s))  CULTURE, BLOOD (ROUTINE X 2)     Status: None   Collection Time    06/17/13  4:45 PM      Result Value Ref Range Status   Specimen Description BLOOD RIGHT HAND   Final   Special Requests BOTTLES DRAWN AEROBIC AND ANAEROBIC 10CC   Final   Culture  Setup Time     Final   Value: 06/17/2013 22:11     Performed at [facility]   Culture     Final   Value:  BLOOD CULTURE RECEIVED NO GROWTH TO DATE CULTURE WILL BE HELD FOR 5 DAYS BEFORE ISSUING A FINAL NEGATIVE REPORT     Performed at [facility]   Report Status PENDING   Incomplete  CULTURE, BLOOD (ROUTINE X 2)     Status: None   Collection Time    06/17/13  4:54 PM      Result Value Ref Range Status   Specimen Description BLOOD LEFT HAND   Final   Special Requests BOTTLES DRAWN AEROBIC ONLY 5CC   Final   Culture  Setup Time     Final   Value: 06/17/2013 22:11     Performed at [facility]   Culture     Final   Value:        BLOOD CULTURE RECEIVED NO GROWTH TO DATE CULTURE WILL BE HELD FOR 5 DAYS BEFORE ISSUING A FINAL NEGATIVE REPORT     Performed at [facility]   Report Status PENDING   Incomplete  MRSA PCR SCREENING     Status: None   Collection Time    06/17/13  6:53 PM      Result Value Ref Range Status   MRSA by PCR NEGATIVE  NEGATIVE Final   Comment:            The GeneXpert MRSA Assay (FDA     approved for NASAL specimens     only), is one component of a     comprehensive MRSA colonization     surveillance program. It is not     intended to diagnose MRSA     infection nor to guide or      monitor treatment for     MRSA infections.  CLOSTRIDIUM DIFFICILE BY PCR     Status: None   Collection Time    06/20/13  5:15 PM      Result Value Ref Range Status   C difficile by pcr NEGATIVE  NEGATIVE Final   Studies/Results: No results found. Medications: I have reviewed the patient's current medications. Scheduled Meds: . ceFEPime (MAXIPIME) IV  2 g Intravenous Q12H  . levothyroxine  100 mcg Oral Once per day on Sun Tue Wed Fri Sat  . metoprolol tartrate  25 mg Oral BID  . metronidazole  500 mg Intravenous Q8H  . pantoprazole  40 mg Oral Daily  . sodium chloride  3 mL Intravenous Q12H   Continuous Infusions:   PRN Meds:.acetaminophen, metoprolol, morphine injection, phenol, promethazine Assessment/Plan:  New onset Atrial Fibrillation - Rate control: Metoprolol 25 BID - Anticoagulation Not a candidate due to GIB  - CHA2DS2-VASc score = 4 - Attempted chemical cardioversion overnight with amiodarone. Still in A. Fib. D/C amiodarone and continue with metoprolol for rate control. - Patient continues to report mild SOB, CXR showed some mild edema, will give Lasix 40mg  IV once to see if this improves patient's SOB.     Diverticulitis of colon- Improving - Will continue clear liquid diet- advance as tolerated - Continue Abx Cefepime and flagyl - CBC stable, leukocytosis trending down.    NSTEMI with new LBBB - Cardiology on board, no current chest pain.  Trops have trended down to neg. - Cardiology plans for [place]. (Although patient may not been good candidate for stent placement and antiplatelet Tx given comorbidity and recurrent GIB) -Not candidate for heparin/ASA due to acute GIB - Patient on Metoprolol  Diarrhea improving -C diff negative    Hypertension mildly hypertensive - Antihypertensives held given Acute  GIB, hypotension. - Metoprolol 25mg  BID for Afib. -Diuresis as above (can start reintroducing BP medications if still hypertensive)    Thyroid disease   synthroid 131mcg PO    Hypokalemia- resolved   Hypomagnesemia- resolved  Diet: Clears- Advance diet as tolerated VTE PPx: SCDs Code status: Full Dispo: Disposition is deferred at this time, awaiting improvement of current medical problems.    The patient does have a current PCP (Richard Marcia Brash, MD) and does not need an [facility] hospital follow-up appointment after discharge.  The patient does not have transportation limitations that hinder transportation to clinic appointments.  .Services Needed at time of discharge: Y = Yes, Blank = No PT:   OT:   RN:   Equipment:   Other:     LOS: 4 days   Joni Reining, DO 06/21/2013, 11:50 AM

## 2013-06-21 NOTE — Consult Note (Signed)
CONSULT NOTE  Date: 06/21/2013               Patient Name:  Danielle [name] MRN: [id number]  DOB: January 27, 1931 Age / Sex: 80 y.o., female        PCP: DONDIEGO,RICHARD M Primary Cardiologist:  Diavion Labrador            Referring Physician: Vanita Panda              Reason for Consult: + POC troponin, LBBB           History of Present Illness: Patient is a 80 y.o. female with a PMHx of hypertension and thyroid disease, who was admitted to [facility] on 06/17/2013 for evaluation of several days of abdominal pain and diarrhea. She was noted have a left bundle branch block which was not seen on previous EKGs.  She's had vomiting of heme-positive emesis. She also has had diarrhea today which is also guaiac-positive.  Her troponin levels were minimally elevated and are now normal. She has developed Atrial fib.   Medications: Outpatient medications: Prescriptions prior to admission  Medication Sig Dispense Refill  . amLODipine (NORVASC) 5 MG tablet Take 5 mg by mouth daily.        Marland Kitchen levothyroxine (SYNTHROID, LEVOTHROID) 100 MCG tablet Take 100 mcg by mouth daily. Do not take synthroid on Mon and Thursdays      . metoprolol (LOPRESSOR) 50 MG tablet Take 25 mg by mouth daily.         Current medications: Current Facility-Administered Medications  Medication Dose Route Frequency Provider Last Rate Last Dose  . acetaminophen (TYLENOL) suppository 325 mg  325 mg Rectal Q8H PRN Jessee Avers, MD   325 mg at 06/17/13 1618  . ceFEPIme (MAXIPIME) 2 g in dextrose 5 % 50 mL IVPB  2 g Intravenous Q12H Kimberly Ballard Hammons, RPH   2 g at 06/21/13 1034  . levothyroxine (SYNTHROID, LEVOTHROID) tablet 100 mcg  100 mcg Oral Once per day on Sun Tue Wed Fri Sat Rocky Crafts Hammons, RPH   100 mcg at 06/21/13 1884  . metoprolol (LOPRESSOR) injection 2.5 mg  2.5 mg Intravenous Q5 min PRN Ejiroghene Emokpae, MD      . metoprolol tartrate (LOPRESSOR) tablet 25 mg  25 mg Oral BID Lorretta Harp, MD   25 mg  at 06/21/13 1033  . metroNIDAZOLE (FLAGYL) IVPB 500 mg  500 mg Intravenous Q8H Jessee Avers, MD   500 mg at 06/21/13 1660  . morphine 2 MG/ML injection 2-4 mg  2-4 mg Intravenous Q4H PRN Othella Boyer, MD   2 mg at 06/21/13 6301  . pantoprazole (PROTONIX) EC tablet 40 mg  40 mg Oral Daily Jessee Avers, MD   40 mg at 06/21/13 1034  . phenol (CHLORASEPTIC) mouth spray 1 spray  1 spray Mouth/Throat PRN Truman Hayward, MD   1 spray at 06/21/13 [phone]  . promethazine (PHENERGAN) injection 6.25 mg  6.25 mg Intravenous Q8H PRN Jessee Avers, MD   6.25 mg at 06/20/13 2245  . sodium chloride 0.9 % injection 3 mL  3 mL Intravenous Q12H Neema Bobbie Stack, MD   3 mL at 06/21/13 1034     Allergies  Allergen Reactions  . Thyroid Hormones     proparathyroid thyroid medication     Past Medical History  Diagnosis Date  . Osteoarthritis   . Macular degeneration   . Hypertension   . Thyroid disease   . Thyroid  cancer   . GI bleed     Past Surgical History  Procedure Laterality Date  . Thyroidectomy    . Colonoscopy  03/17/2011    Procedure: COLONOSCOPY;  Surgeon: Rogene Houston, MD;  Location: [facility];  Service: Endoscopy;  Laterality: N/A;    No family history on file.  Social History:  reports that she has never smoked. She does not have any smokeless tobacco history on file. She reports that she does not drink alcohol or use illicit drugs.    Physical Exam: BP 133/74  Pulse 83  Temp(Src) 97.4 F (36.3 C) (Oral)  Resp 24  Ht 5\' 7"  (1.702 m)  Wt 168 lb 10.4 oz (76.5 kg)  BMI 26.41 kg/m2  SpO2 97%  Wt Readings from Last 3 Encounters:  06/21/13 168 lb 10.4 oz (76.5 kg)  03/28/11 169 lb 6.4 oz (76.839 kg)  03/16/11 165 lb 9.1 oz (75.1 kg)    General: Vital signs reviewed and noted. Chronically ill appearing  Head: Normocephalic, atraumatic, sclera anicteric,   Neck: Supple. Negative for carotid bruits. No JVD   Lungs:  Few basilar rales  Heart: Irreg. Irreg.    Abdomen:   her abdomen is moderately- markedly  distended. She has a very few bowel sounds. It is tender to palpation especially in the lower quadrants.- more tender than earlier this week   ? Mild rebound.   MSK: Strength and the appear normal for age.   Extremities: No clubbing or cyanosis. No edema.  Distal pedal pulses are 2+ and equal   Neurologic: Alert and oriented X 3. Moves all extremities spontaneously.  Psych:  she is somnolent. She's had morphine in the emergency room.     Lab results: Basic Metabolic Panel:  Recent Labs Lab 06/17/13 1659 06/18/13 0222 06/20/13 0407 06/21/13 0305  NA  --  137 138 137  K  --  4.4 4.1 3.7  CL  --  104 107 104  CO2  --  19 19 20   GLUCOSE  --  118* 109* 105*  BUN  --  12 20 17   CREATININE  --  0.74 0.70 0.67  CALCIUM  --  7.3* 7.7* 7.8*  MG 1.3*  --  2.1  --     Liver Function Tests:  Recent Labs Lab 06/17/13 0845 06/18/13 0222  AST 20 23  ALT 18 16  ALKPHOS 72 67  BILITOT 1.0 1.5*  PROT 5.8* 5.2*  ALBUMIN 2.8* 2.3*    Recent Labs Lab 06/17/13 0845  LIPASE 26   No results found for this basename: AMMONIA,  in the last 168 hours  CBC:  Recent Labs Lab 06/17/13 0845  06/18/13 2257 06/19/13 0530 06/19/13 1017 06/20/13 0407 06/21/13 0305  WBC 19.7*  < > 17.0* 16.9* 17.4* 17.5* 14.4*  NEUTROABS 17.2*  --   --   --   --   --   --   HGB 12.8  < > 11.0* 11.0* 11.9* 11.2* 11.3*  HCT 38.5  < > 33.1* 32.8* 36.0 33.9* 34.2*  MCV 87.3  < > 87.8 87.0 88.7 88.7 87.5  PLT 210  < > 187 183 193 204 253  < > = values in this interval not displayed.  Cardiac Enzymes:  Recent Labs Lab 06/18/13 1824 06/19/13 1020 06/19/13 2205 06/20/13 0407 06/20/13 0926  TROPONINI 0.83* 0.46* 0.31* <0.30 <0.30    BNP: No components found with this basename: POCBNP,   CBG: No results found for this  basename: GLUCAP,  in the last 168 hours  Coagulation Studies: No results found for this basename: LABPROT, INR,  in the last 72  hours   Other results: EKG:  Normal sinus rhythm with frequent premature atrial contractions. She has a left bundle branch block which is new from her last EKG 2012.  Imaging: No results found.   Assessment & Plan:  1. Abdominal pain:  [name] presents with intense abdominal pain, nausea, diarrhea.  Has been found to have sigmoid colotis, ? Diverticulitis.     2. Troponin elevation: Very minimal and not significant .  These minimal elevartions are likely due to her underlying abdominal issues.  No evidence of ACS.  No indication for heparin. No contraindication for surgery if needed ( currently, surgery is  Not planning surgery)   3. Hypertension: Stable for now  4. A-Fib:  Likely due to the stress of her colotis. Not a candidate for anticoagulation.   Thayer Headings, Brooke Bonito., MD, [facility] 06/21/2013, 1:35 PM Office - [phone] Pager [phone]

## 2013-06-21 NOTE — Progress Notes (Signed)
Subjective: Being diuresed with lasix, responding well.  Sitting up in chair. no n/v.  Objective: Vital signs in last 24 hours: Temp:  [97.2 F (36.2 C)-98.6 F (37 C)] 97.4 F (36.3 C) (02/14 1210) Pulse Rate:  [62-105] 100 (02/14 0800) Resp:  [27-32] 30 (02/14 0800) BP: (132-153)/(70-93) 146/91 mmHg (02/14 0800) SpO2:  [90 %-98 %] 93 % (02/14 0800) Weight:  [76.5 kg (168 lb 10.4 oz)] 76.5 kg (168 lb 10.4 oz) (02/14 0500) Last BM Date: 06/21/13  Intake/Output from previous day: 02/13 0701 - 02/14 0700 In: 920.1 [P.O.:320; I.V.:300.1; IV Piggyback:300] Out: 200 [Urine:200] Intake/Output this shift: Total I/O In: 3 [I.V.:3] Out: 1501 [Urine:1500; Stool:1]  Physical Exam:  General appearance: alert, cooperative and no distress  Resp: clear to auscultation bilaterally  Cardio: S1S2 irregularly irregular. no murmurs, gallops or rubs. trace non pitting pedal edema.  GI: +bs, abdomen is soft, minimal generaized tenderness, no rebound tenderness or guarding. Reducible umbilical hernia.    Lab Results:   Recent Labs  06/20/13 0407 06/21/13 0305  WBC 17.5* 14.4*  HGB 11.2* 11.3*  HCT 33.9* 34.2*  PLT 204 253   BMET  Recent Labs  06/20/13 0407 06/21/13 0305  NA 138 137  K 4.1 3.7  CL 107 104  CO2 19 20  GLUCOSE 109* 105*  BUN 20 17  CREATININE 0.70 0.67  CALCIUM 7.7* 7.8*   PT/INR No results found for this basename: LABPROT, INR,  in the last 72 hours ABG No results found for this basename: PHART, PCO2, PO2, HCO3,  in the last 72 hours  Studies/Results: No results found.  Anti-infectives: Anti-infectives   Start     Dose/Rate Route Frequency Ordered Stop   06/19/13 0900  metroNIDAZOLE (FLAGYL) IVPB 500 mg     500 mg 100 mL/hr over 60 Minutes Intravenous Every 8 hours 06/19/13 0834     06/18/13 1215  metroNIDAZOLE (FLAGYL) IVPB 500 mg     500 mg 100 mL/hr over 60 Minutes Intravenous  Once 06/18/13 1134 06/18/13 1327   06/18/13 1000  ceFEPIme  (MAXIPIME) 2 g in dextrose 5 % 50 mL IVPB     2 g 100 mL/hr over 30 Minutes Intravenous Every 12 hours 06/18/13 0831     06/18/13 0900  metroNIDAZOLE (FLAGYL) IVPB 500 mg     500 mg 100 mL/hr over 60 Minutes Intravenous  Once 06/18/13 0757 06/18/13 1127   06/18/13 0800  vancomycin (VANCOCIN) IVPB 750 mg/150 ml premix  Status:  Discontinued     750 mg 150 mL/hr over 60 Minutes Intravenous Every 12 hours 06/17/13 1637 06/18/13 0757   06/17/13 2300  ciprofloxacin (CIPRO) IVPB 400 mg  Status:  Discontinued     400 mg 200 mL/hr over 60 Minutes Intravenous Every 12 hours 06/17/13 1852 06/17/13 1853   06/17/13 2000  metroNIDAZOLE (FLAGYL) IVPB 500 mg  Status:  Discontinued     500 mg 100 mL/hr over 60 Minutes Intravenous Every 8 hours 06/17/13 1852 06/17/13 1853   06/17/13 1730  vancomycin (VANCOCIN) 1,500 mg in sodium chloride 0.9 % 500 mL IVPB     1,500 mg 250 mL/hr over 120 Minutes Intravenous  Once 06/17/13 1637 06/17/13 2114   06/17/13 1700  piperacillin-tazobactam (ZOSYN) IVPB 3.375 g  Status:  Discontinued     3.375 g 12.5 mL/hr over 240 Minutes Intravenous 3 times per day 06/17/13 1635 06/18/13 0757   06/17/13 1100  ciprofloxacin (CIPRO) IVPB 400 mg     400 mg 200  mL/hr over 60 Minutes Intravenous  Once 06/17/13 1046 06/17/13 1315   06/17/13 1100  metroNIDAZOLE (FLAGYL) IVPB 500 mg     500 mg 100 mL/hr over 60 Minutes Intravenous  Once 06/17/13 1046 06/17/13 1544       Assessment:  Abdominal pain  Leukocytosis  sigmoid colitis, possible diverticulitis  Elevated troponins  Lactic acidosis  Positive FOBT  AF, New LBBB-cards following  Hx LGI bleed 2012   Plan:  Abdominal pain has improved, white count is down. Hemoglobin is stable, tolerating clears.  Surgery will sign off.  Please do not hesitate to contact [phone]) with any questions or concerns.        LOS: 4 days    Ermon Sagan ANP-BC 06/21/2013 1:14 PM

## 2013-06-21 NOTE — Progress Notes (Signed)
Report called to Jeneen Rinks, RN 2W. 2mg  morphine and phenergan given to patient prior to transfer. Pt wanting to urinate prior to transfer. All belongings sent with patient. eICU and CCMT(Gail) notified of transfer to telemetry. Pt transferred by wheelchair, escorted by NT.

## 2013-06-21 NOTE — Progress Notes (Signed)
I saw the patient, participated in the exam and medical decision making, and concur with the physician assistant's note above.  Afebrile. Decreasing wbc. Clears tolerating.  Soft. Obese. Mildly TTP.  See NP note Signing off. Call with questions  Leighton Ruff. Redmond Pulling, MD, FACS General, Bariatric, & Minimally Invasive Surgery [facility], [place]

## 2013-06-21 NOTE — Progress Notes (Signed)
ANTIBIOTIC CONSULT NOTE - FOLLOW UP  Pharmacy Consult for Cefepime Indication: Diverticulitis; abdominal infection   Allergies  Allergen Reactions  . Thyroid Hormones     proparathyroid thyroid medication    Patient Measurements: Height: 5\' 7"  (170.2 cm) Weight: 168 lb 10.4 oz (76.5 kg) IBW/kg (Calculated) : 61.6  Vital Signs: Temp: 98.6 F (37 C) (02/14 0800) Temp src: Oral (02/14 0800) BP: 142/75 mmHg (02/14 0600) Pulse Rate: 90 (02/14 0640) Intake/Output from previous day: 02/13 0701 - 02/14 0700 In: 920.1 [P.O.:320; I.V.:300.1; IV Piggyback:300] Out: 200 [Urine:200] Intake/Output from this shift:    Labs:  Recent Labs  06/19/13 1017 06/20/13 0407 06/21/13 0305  WBC 17.4* 17.5* 14.4*  HGB 11.9* 11.2* 11.3*  PLT 193 204 253  CREATININE  --  0.70 0.67   Estimated Creatinine Clearance: 59.9 ml/min (by C-G formula based on Cr of 0.67).    Microbiology: Recent Results (from the past 720 hour(s))  CULTURE, BLOOD (ROUTINE X 2)     Status: None   Collection Time    06/17/13  4:45 PM      Result Value Ref Range Status   Specimen Description BLOOD RIGHT HAND   Final   Special Requests BOTTLES DRAWN AEROBIC AND ANAEROBIC 10CC   Final   Culture  Setup Time     Final   Value: 06/17/2013 22:11     Performed at [facility]   Culture     Final   Value:        BLOOD CULTURE RECEIVED NO GROWTH TO DATE CULTURE WILL BE HELD FOR 5 DAYS BEFORE ISSUING A FINAL NEGATIVE REPORT     Performed at [facility]   Report Status PENDING   Incomplete  CULTURE, BLOOD (ROUTINE X 2)     Status: None   Collection Time    06/17/13  4:54 PM      Result Value Ref Range Status   Specimen Description BLOOD LEFT HAND   Final   Special Requests BOTTLES DRAWN AEROBIC ONLY 5CC   Final   Culture  Setup Time     Final   Value: 06/17/2013 22:11     Performed at [facility]   Culture     Final   Value:        BLOOD CULTURE RECEIVED NO GROWTH TO DATE CULTURE WILL BE  HELD FOR 5 DAYS BEFORE ISSUING A FINAL NEGATIVE REPORT     Performed at [facility]   Report Status PENDING   Incomplete  MRSA PCR SCREENING     Status: None   Collection Time    06/17/13  6:53 PM      Result Value Ref Range Status   MRSA by PCR NEGATIVE  NEGATIVE Final   Comment:            The GeneXpert MRSA Assay (FDA     approved for NASAL specimens     only), is one component of a     comprehensive MRSA colonization     surveillance program. It is not     intended to diagnose MRSA     infection nor to guide or     monitor treatment for     MRSA infections.    Anti-infectives   Start     Dose/Rate Route Frequency Ordered Stop   06/19/13 0900  metroNIDAZOLE (FLAGYL) IVPB 500 mg     500 mg 100 mL/hr over 60 Minutes Intravenous Every 8 hours 06/19/13 0834  06/18/13 1215  metroNIDAZOLE (FLAGYL) IVPB 500 mg     500 mg 100 mL/hr over 60 Minutes Intravenous  Once 06/18/13 1134 06/18/13 1327   06/18/13 1000  ceFEPIme (MAXIPIME) 2 g in dextrose 5 % 50 mL IVPB     2 g 100 mL/hr over 30 Minutes Intravenous Every 12 hours 06/18/13 0831     06/18/13 0900  metroNIDAZOLE (FLAGYL) IVPB 500 mg     500 mg 100 mL/hr over 60 Minutes Intravenous  Once 06/18/13 0757 06/18/13 1127   06/18/13 0800  vancomycin (VANCOCIN) IVPB 750 mg/150 ml premix  Status:  Discontinued     750 mg 150 mL/hr over 60 Minutes Intravenous Every 12 hours 06/17/13 1637 06/18/13 0757   06/17/13 2300  ciprofloxacin (CIPRO) IVPB 400 mg  Status:  Discontinued     400 mg 200 mL/hr over 60 Minutes Intravenous Every 12 hours 06/17/13 1852 06/17/13 1853   06/17/13 2000  metroNIDAZOLE (FLAGYL) IVPB 500 mg  Status:  Discontinued     500 mg 100 mL/hr over 60 Minutes Intravenous Every 8 hours 06/17/13 1852 06/17/13 1853   06/17/13 1730  vancomycin (VANCOCIN) 1,500 mg in sodium chloride 0.9 % 500 mL IVPB     1,500 mg 250 mL/hr over 120 Minutes Intravenous  Once 06/17/13 1637 06/17/13 2114   06/17/13 1700   piperacillin-tazobactam (ZOSYN) IVPB 3.375 g  Status:  Discontinued     3.375 g 12.5 mL/hr over 240 Minutes Intravenous 3 times per day 06/17/13 1635 06/18/13 0757   06/17/13 1100  ciprofloxacin (CIPRO) IVPB 400 mg     400 mg 200 mL/hr over 60 Minutes Intravenous  Once 06/17/13 1046 06/17/13 1315   06/17/13 1100  metroNIDAZOLE (FLAGYL) IVPB 500 mg     500 mg 100 mL/hr over 60 Minutes Intravenous  Once 06/17/13 1046 06/17/13 1544      Assessment: 80 yo F on day#4 of Cefepime and Flagyl for diverticulitis vs colitis.  WBC remains elevated and is hovering around 17.  Patient is afebrile and showing slow improvement with an oral diet.  Renal function is stable with CrCl ~ 60 ml/min.  Goal of Therapy:  Eradication of infection; Renal dose adjustment as needed  Plan:  Continue Cefepime 2gm IV q12h. Rx will sign off.  Anticipate no further renal dose adjustment needed.  [employer], Pharm.D., BCPS Clinical Pharmacist Pager [phone] 06/21/2013 8:47 AM

## 2013-06-21 NOTE — Progress Notes (Signed)
Physician notified: Danielle [name] At: 102  Regarding: Saw order for DC amio gtt. No PO coverage?  Awaiting return response.   Returned Response at: 1040  Order(s): Utilizing metoprolol. DC amio gtt.

## 2013-06-22 ENCOUNTER — Inpatient Hospital Stay (HOSPITAL_COMMUNITY): Payer: Medicare Other

## 2013-06-22 ENCOUNTER — Encounter (HOSPITAL_COMMUNITY): Payer: Self-pay | Admitting: Radiology

## 2013-06-22 LAB — CBC
HEMATOCRIT: 36.7 % (ref 36.0–46.0)
Hemoglobin: 12.5 g/dL (ref 12.0–15.0)
MCH: 29.1 pg (ref 26.0–34.0)
MCHC: 34.1 g/dL (ref 30.0–36.0)
MCV: 85.5 fL (ref 78.0–100.0)
Platelets: 321 10*3/uL (ref 150–400)
RBC: 4.29 MIL/uL (ref 3.87–5.11)
RDW: 14.6 % (ref 11.5–15.5)
WBC: 9.8 10*3/uL (ref 4.0–10.5)

## 2013-06-22 MED ORDER — IOHEXOL 300 MG/ML  SOLN
100.0000 mL | Freq: Once | INTRAMUSCULAR | Status: AC | PRN
  Administered 2013-06-22: 100 mL via INTRAVENOUS

## 2013-06-22 MED ORDER — MORPHINE SULFATE 2 MG/ML IJ SOLN
2.0000 mg | Freq: Once | INTRAMUSCULAR | Status: AC
  Administered 2013-06-22: 2 mg via INTRAVENOUS
  Filled 2013-06-22: qty 1

## 2013-06-22 MED ORDER — SIMETHICONE 80 MG PO CHEW
80.0000 mg | CHEWABLE_TABLET | Freq: Once | ORAL | Status: AC
  Administered 2013-06-22: 80 mg via ORAL
  Filled 2013-06-22: qty 1

## 2013-06-22 MED ORDER — MORPHINE SULFATE 4 MG/ML IJ SOLN
4.0000 mg | INTRAMUSCULAR | Status: DC | PRN
  Administered 2013-06-22 – 2013-06-23 (×4): 4 mg via INTRAVENOUS
  Filled 2013-06-22 (×4): qty 1

## 2013-06-22 MED ORDER — METOPROLOL TARTRATE 50 MG PO TABS
50.0000 mg | ORAL_TABLET | Freq: Two times a day (BID) | ORAL | Status: DC
  Administered 2013-06-22 – 2013-06-23 (×3): 50 mg via ORAL
  Filled 2013-06-22 (×5): qty 1

## 2013-06-22 MED ORDER — IOHEXOL 300 MG/ML  SOLN
25.0000 mL | INTRAMUSCULAR | Status: AC
  Administered 2013-06-22 (×2): 25 mL via ORAL

## 2013-06-22 MED ORDER — MORPHINE SULFATE 4 MG/ML IJ SOLN: 4.0000 mg | INTRAMUSCULAR | Status: DC | PRN

## 2013-06-22 NOTE — Progress Notes (Addendum)
SUBJECTIVE: The patient reports worsening abdominal pain/ cramping today.  At this time, she denies chest pain, shortness of breath, or any new concerns.  Marland Kitchen ceFEPime (MAXIPIME) IV  2 g Intravenous Q12H  . levothyroxine  100 mcg Oral Once per day on Sun Tue Wed Fri Sat  . metoprolol tartrate  25 mg Oral BID  . metronidazole  500 mg Intravenous Q8H  . pantoprazole  40 mg Oral Daily  . sodium chloride  3 mL Intravenous Q12H      OBJECTIVE: Physical Exam: Filed Vitals:   06/21/13 1210 06/21/13 1706 06/21/13 2106 06/22/13 0450  BP:  152/90 144/80 137/76  Pulse:  97 102 90  Temp: 97.4 F (36.3 C) 97.7 F (36.5 C) 98.4 F (36.9 C) 98.6 F (37 C)  TempSrc: Oral Oral Oral Oral  Resp:  18 20 18   Height:      Weight:    161 lb 6 oz (73.2 kg)  SpO2:  97% 93% 95%    Intake/Output Summary (Last 24 hours) at 06/22/13 1133 Last data filed at 06/22/13 0600  Gross per 24 hour  Intake    250 ml  Output   2100 ml  Net  -1850 ml    Telemetry reveals afib, V rates 90-110  GEN- The patient is ill appearing, alert and oriented x 3 today.   Head- normocephalic, atraumatic Eyes-  Sclera clear, conjunctiva pink Ears- hearing intact Oropharynx- clear Neck- supple  Lungs- Clear to ausculation bilaterally, normal work of breathing Heart- irregular rate and rhythm  GI- distended, moderately ttp with some guarding Extremities- no clubbing, cyanosis, or edema Neuro- strength and sensation are intact  LABS: Basic Metabolic Panel:  Recent Labs  06/20/13 0407 06/21/13 0305  NA 138 137  K 4.1 3.7  CL 107 104  CO2 19 20  GLUCOSE 109* 105*  BUN 20 17  CREATININE 0.70 0.67  CALCIUM 7.7* 7.8*  MG 2.1  --    Liver Function Tests: No results found for this basename: AST, ALT, ALKPHOS, BILITOT, PROT, ALBUMIN,  in the last 72 hours No results found for this basename: LIPASE, AMYLASE,  in the last 72 hours CBC:  Recent Labs  06/21/13 0305 06/22/13 0638  WBC 14.4* 9.8  HGB 11.3*  12.5  HCT 34.2* 36.7  MCV 87.5 85.5  PLT 253 321   Cardiac Enzymes:  Recent Labs  06/19/13 2205 06/20/13 0407 06/20/13 0926  TROPONINI 0.31* <0.30 <0.30   ASSESSMENT AND PLAN:  Principal Problem:   Diverticulitis of colon Active Problems:   Hypertension   Thyroid disease   Osteoarthritis   Hypokalemia   Elevated troponin   New left bundle branch block   Asymptomatic bacteriuria   Atrial fibrillation  1. Abdominal pain:  Defer to primary team   2. Troponin elevation: Likely "demand" ischemic due to medical illness Consider lexiscan once current issues are resolved (can be done electively as an outpatient).  Though given her advanced age, a more conservative approach and avoidance of further testing would be just as reasonable unless she develops cardiac symptoms.  3. Hypertension: stable  4. A-Fib:  Likely due to the stress of her colitis/ medical illness chads2vasc score is at least 4, though presently she cannot be anticoagulated.  This should be reassessed in the outpatient setting once her medical illness is resolved. Increase metoprolol to 50mg  BID.  I do not think that there is significant advantage to very aggressive rate control.  I would like HR to  mostly be < 100 bpm when not in pain.   Thompson Grayer, MD 06/22/2013 11:33 AM

## 2013-06-22 NOTE — Progress Notes (Signed)
Internal Medicine Teaching Service Daily Progress Note  Subjective: Patient's diet was advanced this AM, she ate about 2-3 bites of grits and has had severe abdominal pain since that time. Reports pain is almost as bad as when she came in. She was given  of morphine without relief (she does take Vicodin chronically at home). Objective: Vital signs in last 24 hours: Filed Vitals:   06/21/13 1210 06/21/13 1706 06/21/13 2106 06/22/13 0450  BP:  152/90 144/80 137/76  Pulse:  97 102 90  Temp: 97.4 F (36.3 C) 97.7 F (36.5 C) 98.4 F (36.9 C) 98.6 F (37 C)  TempSrc: Oral Oral Oral Oral  Resp:  Height:      Weight:    161 lb 6 oz (73.2 kg)  SpO2:  97% 93% 95%   Weight change: -7 lb 4.4 oz (-3.3 kg)  Intake/Output Summary (Last 24 hours) at 06/22/13 0918 Last data filed at 06/22/13 0600  Gross per 24 hour  Intake    453 ml  Output   2351 ml  Net  -1898 ml   General: resting in bed HEENT: EOMI, no scleral icterus Cardiac: Irreguarly Irregular, 2/6 systolic murmur Pulm: clear to auscultation bilaterally, mild decreased bibasilar breath sounds Abd: soft, RLQ TTP, minimal tenderness RUQ, RLQ, LLQ, negative rebound, non distended, BS present Ext: warm and well perfused, no pedal edema Neuro: alert and oriented X3, cranial nerves II-XII grossly intact  Lab Results: Basic Metabolic Panel:  Recent Labs Lab 06/17/13 1659  06/20/13 0407 06/21/13 0305  NA  --   < > 138 137  K  --   < > 4.1 3.7  CL  --   < > 107 104  CO2  --   < > 19 20  GLUCOSE  --   < > 109* 105*  BUN  --   < > 20 17  CREATININE  --   < > 0.70 0.67  CALCIUM  --   < > 7.7* 7.8*  MG 1.3*  --  2.1  --   < > = values in this interval not displayed. Liver Function Tests:  Recent Labs Lab 06/17/13 0845 06/18/13 0222  AST 20 23  ALT 18 16  ALKPHOS 72 67  BILITOT 1.0 1.5*  PROT 5.8* 5.2*  ALBUMIN 2.8* 2.3*    Recent Labs Lab 06/17/13 0845  LIPASE 26   CBC:  Recent Labs Lab  06/17/13 0845  06/21/13 0305 06/22/13 0638  WBC 19.7*  < > 14.4* 9.8  NEUTROABS 17.2*  --   --   --   HGB 12.8  < > 11.3* 12.5  HCT 38.5  < > 34.2* 36.7  MCV 87.3  < > 87.5 85.5  PLT 210  < > 253 321  < > = values in this interval not displayed. Cardiac Enzymes:  Recent Labs Lab 06/19/13 2205 06/20/13 0407 06/20/13 0926  TROPONINI 0.31* <0.30 <0.30   Urinalysis:  Recent Labs Lab 06/17/13 0935  COLORURINE YELLOW  LABSPEC 1.013  PHURINE 5.5  GLUCOSEU NEGATIVE  HGBUR TRACE*  BILIRUBINUR NEGATIVE  KETONESUR 15*  PROTEINUR NEGATIVE  UROBILINOGEN 0.2  NITRITE NEGATIVE  LEUKOCYTESUR TRACE*    Micro Results: Recent Results (from the past 240 hour(s))  CULTURE, BLOOD (ROUTINE X 2)     Status: None   Collection Time    06/17/13  4:45 PM      Result Value Ref Range Status   Specimen Description BLOOD RIGHT HAND  Final   Special Requests BOTTLES DRAWN AEROBIC AND ANAEROBIC 10CC   Final   Culture  Setup Time     Final   Value: 06/17/2013 22:11     Performed at [facility]   Culture     Final   Value:        BLOOD CULTURE RECEIVED NO GROWTH TO DATE CULTURE WILL BE HELD FOR 5 DAYS BEFORE ISSUING A FINAL NEGATIVE REPORT     Performed at [facility]   Report Status PENDING   Incomplete  CULTURE, BLOOD (ROUTINE X 2)     Status: None   Collection Time    06/17/13  4:54 PM      Result Value Ref Range Status   Specimen Description BLOOD LEFT HAND   Final   Special Requests BOTTLES DRAWN AEROBIC ONLY 5CC   Final   Culture  Setup Time     Final   Value: 06/17/2013 22:11     Performed at [facility]   Culture     Final   Value:        BLOOD CULTURE RECEIVED NO GROWTH TO DATE CULTURE WILL BE HELD FOR 5 DAYS BEFORE ISSUING A FINAL NEGATIVE REPORT     Performed at [facility]   Report Status PENDING   Incomplete  MRSA PCR SCREENING     Status: None   Collection Time    06/17/13  6:53 PM      Result Value Ref Range Status   MRSA by PCR  NEGATIVE  NEGATIVE Final   Comment:            The GeneXpert MRSA Assay (FDA     approved for NASAL specimens     only), is one component of a     comprehensive MRSA colonization     surveillance program. It is not     intended to diagnose MRSA     infection nor to guide or     monitor treatment for     MRSA infections.  CLOSTRIDIUM DIFFICILE BY PCR     Status: None   Collection Time    06/20/13  5:15 PM      Result Value Ref Range Status   C difficile by pcr NEGATIVE  NEGATIVE Final   Studies/Results: No results found. Medications: I have reviewed the patient's current medications. Scheduled Meds: . ceFEPime (MAXIPIME) IV  2 g Intravenous Q12H  . levothyroxine  100 mcg Oral Once per day on Sun Tue Wed Fri Sat  . metoprolol tartrate  25 mg Oral BID  . metronidazole  500 mg Intravenous Q8H  . pantoprazole  40 mg Oral Daily  . sodium chloride  3 mL Intravenous Q12H   Continuous Infusions:   PRN Meds:.acetaminophen, diazepam, magic mouthwash, metoprolol, morphine injection, phenol, promethazine Assessment/Plan:    Diverticulitis of colon- worsening abdominal pain today - Return to NPO - Continue Abx Cefepime and flagyl - leukocytosis has resolved and patient is afebrile, I am concerned for the extent of pain the patient has given she is 4 days into treatment with broad spectrum coverage.  Will obtain at CT of Abd/pelvis with contrast. -Increase Morphine to 4-6mg  Q4PRN with holding parameters.  New onset Atrial Fibrillation - Rate control: Metoprolol 25 BID - Anticoagulation Not a candidate due to GIB  - CHA2DS2-VASc score = 4 -Patient dieuriesed 1.8L overnight after lasix. SOB did improve.    NSTEMI with new LBBB - Cardiology on board, no  current chest pain.  Trops have trended down to neg. - Cardiology plans for [place]. (Although patient may not been good candidate for stent placement and antiplatelet Tx given comorbidity and recurrent GIB) -Not candidate for  heparin/ASA due to acute GIB - Patient on Metoprolol  Diarrhea improving -C diff negative -Continue to monitor    Hypertension mildly hypertensive - Antihypertensives held given Acute GIB, hypotension. - Metoprolol 25mg  BID for Afib. -restart amlodipine 5mg  if hypertensive.    Thyroid disease  synthroid 129mcg PO    Hypokalemia- resolved   Hypomagnesemia- resolved  Diet: NPO VTE PPx: SCDs Code status: Full Dispo: Disposition is deferred at this time, awaiting improvement of current medical problems.    The patient does have a current PCP (Richard Marcia Brash, MD) and does not need an [facility] hospital follow-up appointment after discharge.  The patient does not have transportation limitations that hinder transportation to clinic appointments.  .Services Needed at time of discharge: Y = Yes, Blank = No PT:   OT:   RN:   Equipment:   Other:     LOS: 5 days   Joni Reining, DO 06/22/2013, 9:18 AM

## 2013-06-22 NOTE — Progress Notes (Signed)
Clinical Social Work Department BRIEF PSYCHOSOCIAL ASSESSMENT 06/22/2013  Patient:  Danielle [name],Danielle [name]     Account Number:  [id number]     Admit date:  06/17/2013  Clinical Social Worker:  Jlen Wintle, LCSWA  Date/Time:  06/22/2013 03:13 PM  Referred by:  CSW  Date Referred:  06/20/2013 Referred for  SNF Placement   Other Referral:   Interview type:  Patient Other interview type:   Weekend CSW met with patient, husband Chester, and niece Becky    PSYCHOSOCIAL DATA Living Status:  FAMILY Admitted from facility:   Level of care:   Primary support name:  Chester Agerton [phone] Primary support relationship to patient:  SPOUSE Degree of support available:   Strong    CURRENT CONCERNS Current Concerns  Post-Acute Placement   Other Concerns:    SOCIAL WORK ASSESSMENT / PLAN Weekend CSW met with patient, husband Chester, and niece Becky. Patient stated that she was in pain and has not eaten in days. She states that she is having stomach pain that is preventing her from eating. CSW explained PT recommendation of SNF or 24-hour supervision at discharge. Patient feels that she may benefit from SNF at discharge if she continues to feel unwell. Patient's husband and niece Becky appear to be a strong support system. Patient/family agreeable for CSW to intiate SNF search in [place] with a preference for [facility]. They state however that home health may be of interest. CSW explained that as patient approaches discharge, patient and family can guage patient's mobility and determine which avenue may better meet her needs. Patient was agreeable to plan. Patient and family thanked CSW for assistance, weekday CSW to follow up with SNF bed offers.   Assessment/plan status:  Information/Referral to Community Resources Other assessment/ plan:   Information/referral to community resources:   Weekend CSW will intiate SNF search in [place]    PATIENT'S/FAMILY'S  RESPONSE TO PLAN OF CARE: Patient states that she is experiencing stomach pain and does not think she can return home in her current state. She believes that SNF may be beneficial and stated a preference for [facility]. Patient and family also interested in home health depending on patient's medical improvement.    Buckley Bradly, MSW, LCSWA Clinical Social Worker [facility]. [phone]     

## 2013-06-22 NOTE — Progress Notes (Signed)
Clinical Social Work Department CLINICAL SOCIAL WORK PLACEMENT NOTE 06/22/2013  Patient:  Danielle [name], Danielle [name]  Account Number:  [id number] Admit date:  06/17/2013  Clinical Social Worker:  Tilden Fossa, Latanya Presser  Date/time:  06/22/2013 04:23 PM  Clinical Social Work is seeking post-discharge placement for this patient at the following level of care:   SKILLED NURSING   (*CSW will update this form in Epic as items are completed)     Patient/family provided with [place] Department of Clinical Social Work's list of facilities offering this level of care within the geographic area requested by the patient (or if unable, by the patient's family).  06/22/2013  Patient/family informed of their freedom to choose among providers that offer the needed level of care, that participate in Medicare, Medicaid or managed care program needed by the patient, have an available bed and are willing to accept the patient.  06/22/2013  Patient/family informed of [facility]' ownership interest in [facility], as well as of the fact that they are under no obligation to receive care at this facility.  PASARR submitted to EDS on 06/22/2013 PASARR number received from EDS on 06/22/2013  FL2 transmitted to all facilities in geographic area requested by pt/family on  06/22/2013 FL2 transmitted to all facilities within larger geographic area on   Patient informed that his/her managed care company has contracts with or will negotiate with  certain facilities, including the following:     Patient/family informed of bed offers received:   Patient chooses bed at  Physician recommends and patient chooses bed at    Patient to be transferred to  on   Patient to be transferred to facility by   The following physician request were entered in Epic:   Additional Comments:   Tilden Fossa, MSW, [place] Worker [facility]. [phone]

## 2013-06-23 DIAGNOSIS — I214 Non-ST elevation (NSTEMI) myocardial infarction: Secondary | ICD-10-CM

## 2013-06-23 DIAGNOSIS — E876 Hypokalemia: Secondary | ICD-10-CM

## 2013-06-23 LAB — CULTURE, BLOOD (ROUTINE X 2)
Culture: NO GROWTH
Culture: NO GROWTH

## 2013-06-23 LAB — BASIC METABOLIC PANEL
BUN: 7 mg/dL (ref 6–23)
CHLORIDE: 95 meq/L — AB (ref 96–112)
CO2: 23 mEq/L (ref 19–32)
Calcium: 7.5 mg/dL — ABNORMAL LOW (ref 8.4–10.5)
Creatinine, Ser: 0.69 mg/dL (ref 0.50–1.10)
GFR calc non Af Amer: 80 mL/min — ABNORMAL LOW (ref >90)
GLUCOSE: 79 mg/dL (ref 70–99)
POTASSIUM: 2.5 meq/L — AB (ref 3.7–5.3)
Sodium: 136 mEq/L — ABNORMAL LOW (ref 137–147)

## 2013-06-23 LAB — CBC
HEMATOCRIT: 36.8 % (ref 36.0–46.0)
HEMOGLOBIN: 12.6 g/dL (ref 12.0–15.0)
MCH: 29.2 pg (ref 26.0–34.0)
MCHC: 34.2 g/dL (ref 30.0–36.0)
MCV: 85.4 fL (ref 78.0–100.0)
Platelets: 376 10*3/uL (ref 150–400)
RBC: 4.31 MIL/uL (ref 3.87–5.11)
RDW: 14.9 % (ref 11.5–15.5)
WBC: 9.7 10*3/uL (ref 4.0–10.5)

## 2013-06-23 MED ORDER — CIPROFLOXACIN HCL 500 MG PO TABS
500.0000 mg | ORAL_TABLET | Freq: Two times a day (BID) | ORAL | Status: DC
  Filled 2013-06-23 (×4): qty 1

## 2013-06-23 MED ORDER — METRONIDAZOLE 500 MG PO TABS
500.0000 mg | ORAL_TABLET | Freq: Three times a day (TID) | ORAL | Status: DC
  Administered 2013-06-23: 500 mg via ORAL
  Filled 2013-06-23 (×6): qty 1

## 2013-06-23 MED ORDER — POTASSIUM CHLORIDE CRYS ER 20 MEQ PO TBCR
20.0000 meq | EXTENDED_RELEASE_TABLET | ORAL | Status: AC
  Administered 2013-06-23: 20 meq via ORAL
  Filled 2013-06-23: qty 1

## 2013-06-23 MED ORDER — METRONIDAZOLE IN NACL 5-0.79 MG/ML-% IV SOLN
500.0000 mg | Freq: Once | INTRAVENOUS | Status: AC
  Administered 2013-06-24: 500 mg via INTRAVENOUS
  Filled 2013-06-23: qty 100

## 2013-06-23 MED ORDER — POTASSIUM CHLORIDE 10 MEQ/100ML IV SOLN
10.0000 meq | INTRAVENOUS | Status: AC
  Administered 2013-06-23 (×4): 10 meq via INTRAVENOUS
  Filled 2013-06-23 (×4): qty 100

## 2013-06-23 MED ORDER — HYDROCODONE-ACETAMINOPHEN 5-325 MG PO TABS
1.0000 | ORAL_TABLET | ORAL | Status: DC | PRN
  Administered 2013-06-23 (×2): 1 via ORAL
  Filled 2013-06-23 (×2): qty 1

## 2013-06-23 MED ORDER — CIPROFLOXACIN IN D5W 400 MG/200ML IV SOLN
400.0000 mg | Freq: Two times a day (BID) | INTRAVENOUS | Status: AC
  Administered 2013-06-24: 400 mg via INTRAVENOUS
  Filled 2013-06-23: qty 200

## 2013-06-23 MED ORDER — FUROSEMIDE 20 MG PO TABS
20.0000 mg | ORAL_TABLET | Freq: Once | ORAL | Status: DC
  Filled 2013-06-23: qty 1

## 2013-06-23 NOTE — Progress Notes (Signed)
Internal Medicine Teaching Service Daily Progress Note  Subjective: Patient's abdominal pain much improved since yesterday.  Thinks it may have been gas as simethicone (Gas-X) seemed to help her pain a lot.  Sill having some loose stools.  She is tolerating a clear liquid diet again this morning without issue and wants to further advance her diet. Of note she takes Norco 7.5/325 at home 4-5 times a day for chronic arthritis pain (cannot take NSAIDs due to GI bleed) Objective: Vital signs in last 24 hours: Filed Vitals:   06/22/13 2043 06/23/13 0415 06/23/13 1017 06/23/13 1327  BP: 146/76 130/71 129/85 124/79  Pulse: 97 78 89 85  Temp: 98.5 F (36.9 C) 98.4 F (36.9 C)  99 F (37.2 C)  TempSrc: Oral Oral  Oral  Resp: 20 22  18   Height:      Weight:  159 lb 14.4 oz (72.53 kg)    SpO2: 95% 96%  94%   Weight change: -1 lb 7.6 oz (-0.67 kg)  Intake/Output Summary (Last 24 hours) at 06/23/13 1353 Last data filed at 06/23/13 1300  Gross per 24 hour  Intake    610 ml  Output    100 ml  Net    510 ml   General: resting in bed, NAD HEENT: EOMI, no scleral icterus Cardiac: Irreguarly Irregular, 2/6 systolic murmur Pulm: clear to auscultation bilaterally, mild decreased bibasilar breath sounds Abd: soft, minimal RLQ TTP, nontender RUQ, RLQ, LLQ, negative rebound, non distended, BS present Ext: warm and well perfused, no pedal edema Neuro: alert and oriented X3, cranial nerves II-XII grossly intact  Lab Results: Basic Metabolic Panel:  Recent Labs Lab 06/17/13 1659  06/20/13 0407 06/21/13 0305 06/23/13 0430  NA  --   < > 138 137 136*  K  --   < > 4.1 3.7 2.5*  CL  --   < > 107 104 95*  CO2  --   < > 19 20 23   GLUCOSE  --   < > 109* 105* 79  BUN  --   < > 20 17 7   CREATININE  --   < > 0.70 0.67 0.69  CALCIUM  --   < > 7.7* 7.8* 7.5*  MG 1.3*  --  2.1  --   --   < > = values in this interval not displayed. Liver Function Tests:  Recent Labs Lab 06/17/13 0845  06/18/13 0222  AST 20 23  ALT 18 16  ALKPHOS 72 67  BILITOT 1.0 1.5*  PROT 5.8* 5.2*  ALBUMIN 2.8* 2.3*    Recent Labs Lab 06/17/13 0845  LIPASE 26   CBC:  Recent Labs Lab 06/17/13 0845  06/22/13 0638 06/23/13 0430  WBC 19.7*  < > 9.8 9.7  NEUTROABS 17.2*  --   --   --   HGB 12.8  < > 12.5 12.6  HCT 38.5  < > 36.7 36.8  MCV 87.3  < > 85.5 85.4  PLT 210  < > 321 376  < > = values in this interval not displayed. Cardiac Enzymes:  Recent Labs Lab 06/19/13 2205 06/20/13 0407 06/20/13 0926  TROPONINI 0.31* <0.30 <0.30   Urinalysis:  Recent Labs Lab 06/17/13 0935  COLORURINE YELLOW  LABSPEC 1.013  PHURINE 5.5  GLUCOSEU NEGATIVE  HGBUR TRACE*  BILIRUBINUR NEGATIVE  KETONESUR 15*  PROTEINUR NEGATIVE  UROBILINOGEN 0.2  NITRITE NEGATIVE  LEUKOCYTESUR TRACE*    Micro Results: Recent Results (from the past 240 hour(s))  CULTURE, BLOOD (ROUTINE X 2)     Status: None   Collection Time    06/17/13  4:45 PM      Result Value Ref Range Status   Specimen Description BLOOD RIGHT HAND   Final   Special Requests BOTTLES DRAWN AEROBIC AND ANAEROBIC 10CC   Final   Culture  Setup Time     Final   Value: 06/17/2013 22:11     Performed at [facility]   Culture     Final   Value: NO GROWTH 5 DAYS     Performed at [facility]   Report Status 06/23/2013 FINAL   Final  CULTURE, BLOOD (ROUTINE X 2)     Status: None   Collection Time    06/17/13  4:54 PM      Result Value Ref Range Status   Specimen Description BLOOD LEFT HAND   Final   Special Requests BOTTLES DRAWN AEROBIC ONLY 5CC   Final   Culture  Setup Time     Final   Value: 06/17/2013 22:11     Performed at [facility]   Culture     Final   Value: NO GROWTH 5 DAYS     Performed at [facility]   Report Status 06/23/2013 FINAL   Final  MRSA PCR SCREENING     Status: None   Collection Time    06/17/13  6:53 PM      Result Value Ref Range Status   MRSA by PCR NEGATIVE   NEGATIVE Final   Comment:            The GeneXpert MRSA Assay (FDA     approved for NASAL specimens     only), is one component of a     comprehensive MRSA colonization     surveillance program. It is not     intended to diagnose MRSA     infection nor to guide or     monitor treatment for     MRSA infections.  CLOSTRIDIUM DIFFICILE BY PCR     Status: None   Collection Time    06/20/13  5:15 PM      Result Value Ref Range Status   C difficile by pcr NEGATIVE  NEGATIVE Final   Studies/Results: Ct Abdomen Pelvis W Contrast  06/22/2013   CLINICAL DATA:  Right lower quadrant pain. Patient receiving therapy for diverticulitis.  EXAM: CT ABDOMEN AND PELVIS WITH CONTRAST  TECHNIQUE: Multidetector CT imaging of the abdomen and pelvis was performed using the standard protocol following bolus administration of intravenous contrast.  CONTRAST:  172mL OMNIPAQUE IOHEXOL 300 MG/ML  SOLN  COMPARISON:  CT CTA ABD/PEL W/CM AND/OR W/O CM dated 06/17/2013; CT ABD/PELV WO CM dated 06/17/2013  FINDINGS: There is interval increase in small bilateral pleural effusions. Mild basilar atelectasis. No evidence of pneumonia. No pericardial fluid. The heart is enlarged.  No focal hepatic lesion. There is fatty infiltration along the falciform ligament. Gallbladder, pancreas, spleen, adrenal glands, and kidneys are normal. Nonenhancing cyst in the left kidney.  Stomach, small bowel, and cecum are unchanged. There is a long segment of bowel wall thickening involving the distal ileum leading up to the terminal ileum (image 49) for example. The ascending colon transverse colon are normal. There are diverticula of the descending colon. Again demonstrated a circumferential bowel wall thickening and mucosal thickening over a long segment of sigmoid colon approximately 12 cm (image 67). This is similar prior. There is  mild pericolonic inflammation approximately associated with this segment of bowel wall thickening. Contrast flows to  entirety the colon the rectum which appears normal.  Bowel aorta is normal caliber. No retroperitoneal periportal lymphadenopathy.  Again demonstrated a soft tissue mass adjacent to the sigmoid colon measuring 2.5 x 1.5 cm not changed from prior. Medially adjacent there is a of peripherally calcified mass measuring 3.0 x 3.1 cm unchanged from prior.  No aggressive osseous lesion.  IMPRESSION: 1. Interval increase in bilateral pleural effusions. No evidence of pneumonia. 2. Persistent long segment of distal small bowel wall thickening leading up to terminal ileum. Favor terminal ileitis but cannot exclude inflammatory bowel disease. 3. Long segment of bowel wall thickening involving the proximal sigmoid colon with mild pericolonic thickening consistent with diverticulitis. No evidence of progression compared to prior. 4. Soft tissue and calcified lesions in the left adnexal region are unchanged from prior   Electronically Signed   By: Suzy Bouchard M.D.   On: 06/22/2013 20:50   Medications: I have reviewed the patient's current medications. Scheduled Meds: . ciprofloxacin  500 mg Oral BID  . levothyroxine  100 mcg Oral Once per day on Sun Tue Wed Fri Sat  . metoprolol tartrate  50 mg Oral BID  . metroNIDAZOLE  500 mg Oral 3 times per day  . pantoprazole  40 mg Oral Daily  . potassium chloride  10 mEq Intravenous Q1 Hr x 4  . potassium chloride  20 mEq Oral Q4H  . sodium chloride  3 mL Intravenous Q12H   Continuous Infusions:   PRN Meds:.acetaminophen, diazepam, HYDROcodone-acetaminophen, magic mouthwash, metoprolol, phenol, promethazine Assessment/Plan:    Diverticulitis of colon- improving - Advance diet as tolerated - D/C IV Cefepime and flagyl change to PO Cipro and flagyl to complete 10 day total course -Convert morphine to PO Norco. 5/325 1-2 tablets Q4PRN - Patient will need follow up colonoscopy in 4-6 weeks.  New onset Atrial Fibrillation - Rate control: Metoprolol 50 BID (rate  currently in 80s) - Anticoagulation Not a candidate due to GIB  - CHA2DS2-VASc score = 4 -Patient does have B/L pleural effusions on CT abdomen, will start PO lasix and monitor overnight - Patient will need to readdress A/C after colonoscopy    Troponin elevation with new LBBB - Cardiology on board, no current chest pain.  Trops have trended down to neg. Likely 'demand" ischemia - Cardiology plans for lexiscan. (Although patient may not been good candidate for stent placement and antiplatelet Tx given comorbidity and recurrent GIB) Can do as outpatient. -Not candidate for heparin/ASA due to acute GIB - Patient on Metoprolol   Diarrhea improving -C diff negative -Continue to monitor    Hypertension mildly hypertensive - Antihypertensives held given Acute GIB, hypotension. - Metoprolol 50mg  BID for Afib. -restart amlodipine 5mg  if hypertensive.    Thyroid disease  synthroid 150mcg PO    Hypokalemia- K+ 2.5 this am - Gave 4 runs KCl this AM - Will give 47mEq x3 KDur - Recheck BMP in AM.    Hypomagnesemia- resolved  Diet: Clears -> Heart healthy if tolerates VTE PPx: SCDs Code status: Full Dispo: Disposition is deferred at this time, awaiting improvement of current medical problems.  Possible discharge tomorrow, maybe to SNF.  The patient does have a current PCP (Richard Marcia Brash, MD) and does not need an [facility] hospital follow-up appointment after discharge.  The patient does not have transportation limitations that hinder transportation to clinic appointments.  .Services Needed at time of  discharge: Y = Yes, Blank = No PT:   OT:   RN:   Equipment:   Other:     LOS: 6 days   Joni Reining, DO 06/23/2013, 1:53 PM

## 2013-06-23 NOTE — Progress Notes (Signed)
Patient refused her 2000 and 2200 medications. Stated "feels like been stabbed" in her abdomen. Would want her pain controlled first before considering taking other medications. Doctor notified. No order at this time. Will continue to monitor.

## 2013-06-23 NOTE — Progress Notes (Signed)
Patient Name: Danielle [name] Date of Encounter: 06/23/2013  Principal Problem:   Diverticulitis of colon Active Problems:   Hypertension   Thyroid disease   Osteoarthritis   Hypokalemia   Elevated troponin   New left bundle branch block   Asymptomatic bacteriuria   Atrial fibrillation   Length of Stay: 6  SUBJECTIVE  Feeling better. Less abdominal pain. Barium from CT eliminated, no other BM  Ventricular rate 65-90 bpm  CURRENT MEDS . ceFEPime (MAXIPIME) IV  2 g Intravenous Q12H  . levothyroxine  100 mcg Oral Once per day on Sun Tue Wed Fri Sat  . metoprolol tartrate  50 mg Oral BID  . metronidazole  500 mg Intravenous Q8H  . pantoprazole  40 mg Oral Daily  . potassium chloride  10 mEq Intravenous Q1 Hr x 4  . sodium chloride  3 mL Intravenous Q12H    OBJECTIVE   Intake/Output Summary (Last 24 hours) at 06/23/13 1200 Last data filed at 06/23/13 0814  Gross per 24 hour  Intake    490 ml  Output    100 ml  Net    390 ml   Filed Weights   06/21/13 0500 06/22/13 0450 06/23/13 0415  Weight: 76.5 kg (168 lb 10.4 oz) 73.2 kg (161 lb 6 oz) 72.53 kg (159 lb 14.4 oz)    PHYSICAL EXAM Filed Vitals:   06/22/13 1529 06/22/13 2043 06/23/13 0415 06/23/13 1017  BP: 141/82 146/76 130/71 129/85  Pulse: 87 97 78 89  Temp: 97.8 F (36.6 C) 98.5 F (36.9 C) 98.4 F (36.9 C)   TempSrc: Oral Oral Oral   Resp: 18 20 22    Height:      Weight:   72.53 kg (159 lb 14.4 oz)   SpO2: 96% 95% 96%    General: Alert, oriented x3, no distress Head: no evidence of trauma, PERRL, EOMI, no exophtalmos or lid lag, no myxedema, no xanthelasma; normal ears, nose and oropharynx Neck: normal jugular venous pulsations and no hepatojugular reflux; brisk carotid pulses without delay and no carotid bruits Chest: clear to auscultation, no signs of consolidation by percussion or palpation, normal fremitus, symmetrical and full respiratory excursions Cardiovascular: normal position and quality  of the apical impulse, irregular rhythm, normal first and second heart sounds, no rubs or gallops, no murmur Abdomen: no tenderness or distention, no masses by palpation, no abnormal pulsatility or arterial bruits, normal bowel sounds, no hepatosplenomegaly Extremities: no clubbing, cyanosis or edema; 2+ radial, ulnar and brachial pulses bilaterally; 2+ right femoral, posterior tibial and dorsalis pedis pulses; 2+ left femoral, posterior tibial and dorsalis pedis pulses; no subclavian or femoral bruits Neurological: grossly nonfocal  LABS  CBC  Recent Labs  06/22/13 0638 06/23/13 0430  WBC 9.8 9.7  HGB 12.5 12.6  HCT 36.7 36.8  MCV 85.5 85.4  PLT 321 585   Basic Metabolic Panel  Recent Labs  06/21/13 0305 06/23/13 0430  NA 137 136*  K 3.7 2.5*  CL 104 95*  CO2 20 23  GLUCOSE 105* 79  BUN 17 7  CREATININE 0.67 0.69  CALCIUM 7.8* 7.5*   Radiology Studies Imaging results have been reviewed and Ct Abdomen Pelvis W Contrast  06/22/2013   CLINICAL DATA:  Right lower quadrant pain. Patient receiving therapy for diverticulitis.  EXAM: CT ABDOMEN AND PELVIS WITH CONTRAST  TECHNIQUE: Multidetector CT imaging of the abdomen and pelvis was performed using the standard protocol following bolus administration of intravenous contrast.  CONTRAST:  128mL OMNIPAQUE  IOHEXOL 300 MG/ML  SOLN  COMPARISON:  CT CTA ABD/PEL W/CM AND/OR W/O CM dated 06/17/2013; CT ABD/PELV WO CM dated 06/17/2013  FINDINGS: There is interval increase in small bilateral pleural effusions. Mild basilar atelectasis. No evidence of pneumonia. No pericardial fluid. The heart is enlarged.  No focal hepatic lesion. There is fatty infiltration along the falciform ligament. Gallbladder, pancreas, spleen, adrenal glands, and kidneys are normal. Nonenhancing cyst in the left kidney.  Stomach, small bowel, and cecum are unchanged. There is a long segment of bowel wall thickening involving the distal ileum leading up to the terminal ileum  (image 49) for example. The ascending colon transverse colon are normal. There are diverticula of the descending colon. Again demonstrated a circumferential bowel wall thickening and mucosal thickening over a long segment of sigmoid colon approximately 12 cm (image 67). This is similar prior. There is mild pericolonic inflammation approximately associated with this segment of bowel wall thickening. Contrast flows to entirety the colon the rectum which appears normal.  Bowel aorta is normal caliber. No retroperitoneal periportal lymphadenopathy.  Again demonstrated a soft tissue mass adjacent to the sigmoid colon measuring 2.5 x 1.5 cm not changed from prior. Medially adjacent there is a of peripherally calcified mass measuring 3.0 x 3.1 cm unchanged from prior.  No aggressive osseous lesion.  IMPRESSION: 1. Interval increase in bilateral pleural effusions. No evidence of pneumonia. 2. Persistent long segment of distal small bowel wall thickening leading up to terminal ileum. Favor terminal ileitis but cannot exclude inflammatory bowel disease. 3. Long segment of bowel wall thickening involving the proximal sigmoid colon with mild pericolonic thickening consistent with diverticulitis. No evidence of progression compared to prior. 4. Soft tissue and calcified lesions in the left adnexal region are unchanged from prior   Electronically Signed   By: Suzy Bouchard M.D.   On: 06/22/2013 20:50    TELE AF, good ventricular rate control  ECG AF, LBBB  ASSESSMENT AND PLAN Continue with rate control strategy. Unable to provide anticoagulant or antiplatelet therapy. Lexiscan Myoview when her situation stabilizes - outpatient if she continues to improve. Would do as an inpatient if need for abdominal surgery is anticipated.  She used to see Dr. Lia Foyer. Her husband sees Dr. Harrington Challenger. She prefer f/u with Cardiologist in [place].   Sanda Klein, MD, [facility] [phone] office [phone]  pager 06/23/2013 12:00 PM

## 2013-06-23 NOTE — Progress Notes (Signed)
CSW spoke to patient about offers. Patient states she wants Home Health PT/RN and does not want SNF at this time. CSW provided bed offers any way and encouraged patient to speak with husband.   Jeanette Caprice, MSW, [place]

## 2013-06-23 NOTE — Progress Notes (Signed)
Patient seen and examined with resident team.  I agree with assessment and plan as outlined.

## 2013-06-24 LAB — BASIC METABOLIC PANEL
BUN: 8 mg/dL (ref 6–23)
BUN: 7 mg/dL (ref 6–23)
BUN: 6 mg/dL (ref 6–23)
CALCIUM: 7.2 mg/dL — AB (ref 8.4–10.5)
CHLORIDE: 95 meq/L — AB (ref 96–112)
CHLORIDE: 95 meq/L — AB (ref 96–112)
CO2: 24 meq/L (ref 19–32)
CO2: 23 mEq/L (ref 19–32)
CO2: 24 mEq/L (ref 19–32)
Calcium: 7.4 mg/dL — ABNORMAL LOW (ref 8.4–10.5)
Calcium: 7.4 mg/dL — ABNORMAL LOW (ref 8.4–10.5)
Chloride: 99 mEq/L (ref 96–112)
Creatinine, Ser: 0.68 mg/dL (ref 0.50–1.10)
Creatinine, Ser: 0.65 mg/dL (ref 0.50–1.10)
Creatinine, Ser: 0.63 mg/dL (ref 0.50–1.10)
GFR calc Af Amer: >90 mL/min (ref >90)
GFR calc non Af Amer: 80 mL/min — ABNORMAL LOW (ref >90)
GFR calc non Af Amer: 82 mL/min — ABNORMAL LOW (ref >90)
GFR, EST NON AFRICAN AMERICAN: 82 mL/min — AB (ref >90)
GLUCOSE: 83 mg/dL (ref 70–99)
Glucose, Bld: 114 mg/dL — ABNORMAL HIGH (ref 70–99)
Glucose, Bld: 95 mg/dL (ref 70–99)
POTASSIUM: 2.5 meq/L — AB (ref 3.7–5.3)
POTASSIUM: 2.8 meq/L — AB (ref 3.7–5.3)
Potassium: 3 mEq/L — ABNORMAL LOW (ref 3.7–5.3)
SODIUM: 138 meq/L (ref 137–147)
Sodium: 135 mEq/L — ABNORMAL LOW (ref 137–147)
Sodium: 135 mEq/L — ABNORMAL LOW (ref 137–147)

## 2013-06-24 LAB — GI PATHOGEN PANEL BY PCR, STOOL
C DIFFICILE TOXIN A/B: POSITIVE
CAMPYLOBACTER BY PCR: NEGATIVE
Cryptosporidium by PCR: NEGATIVE
E COLI 0157 BY PCR: NEGATIVE
E coli (ETEC) LT/ST: NEGATIVE
E coli (STEC): NEGATIVE
G lamblia by PCR: NEGATIVE
Norovirus GI/GII: NEGATIVE
ROTAVIRUS A BY PCR: NEGATIVE
Salmonella by PCR: NEGATIVE
Shigella by PCR: NEGATIVE

## 2013-06-24 LAB — MAGNESIUM
MAGNESIUM: 1.4 mg/dL — AB (ref 1.5–2.5)
Magnesium: 1.4 mg/dL — ABNORMAL LOW (ref 1.5–2.5)

## 2013-06-24 LAB — POTASSIUM: POTASSIUM: 3 meq/L — AB (ref 3.7–5.3)

## 2013-06-24 MED ORDER — CIPROFLOXACIN IN D5W 400 MG/200ML IV SOLN
400.0000 mg | Freq: Two times a day (BID) | INTRAVENOUS | Status: DC
  Administered 2013-06-24 – 2013-06-25 (×2): 400 mg via INTRAVENOUS
  Filled 2013-06-24 (×3): qty 200

## 2013-06-24 MED ORDER — MORPHINE SULFATE 4 MG/ML IJ SOLN
4.0000 mg | INTRAMUSCULAR | Status: DC | PRN
  Administered 2013-06-24 – 2013-06-25 (×7): 4 mg via INTRAVENOUS
  Filled 2013-06-24 (×7): qty 1

## 2013-06-24 MED ORDER — POTASSIUM CHLORIDE 10 MEQ/100ML IV SOLN
10.0000 meq | INTRAVENOUS | Status: AC
  Administered 2013-06-24 (×2): 10 meq via INTRAVENOUS
  Filled 2013-06-24 (×4): qty 100

## 2013-06-24 MED ORDER — PANTOPRAZOLE SODIUM 40 MG IV SOLR
40.0000 mg | INTRAVENOUS | Status: DC
  Administered 2013-06-24: 40 mg via INTRAVENOUS
  Filled 2013-06-24 (×2): qty 40

## 2013-06-24 MED ORDER — METRONIDAZOLE IN NACL 5-0.79 MG/ML-% IV SOLN
500.0000 mg | Freq: Three times a day (TID) | INTRAVENOUS | Status: DC
  Administered 2013-06-24 – 2013-06-25 (×3): 500 mg via INTRAVENOUS
  Filled 2013-06-24 (×5): qty 100

## 2013-06-24 MED ORDER — POTASSIUM CHLORIDE 10 MEQ/100ML IV SOLN
10.0000 meq | INTRAVENOUS | Status: AC
  Administered 2013-06-24 (×2): 10 meq via INTRAVENOUS
  Filled 2013-06-24 (×6): qty 100

## 2013-06-24 MED ORDER — MAGNESIUM SULFATE 40 MG/ML IJ SOLN
2.0000 g | Freq: Once | INTRAMUSCULAR | Status: AC
  Administered 2013-06-24: 2 g via INTRAVENOUS
  Filled 2013-06-24 (×2): qty 50

## 2013-06-24 MED ORDER — POTASSIUM CHLORIDE 10 MEQ/100ML IV SOLN
10.0000 meq | INTRAVENOUS | Status: AC
  Administered 2013-06-24: 10 meq via INTRAVENOUS
  Filled 2013-06-24 (×3): qty 100

## 2013-06-24 MED ORDER — LEVOTHYROXINE SODIUM 100 MCG IV SOLR
50.0000 ug | Freq: Every day | INTRAVENOUS | Status: DC
  Administered 2013-06-24 – 2013-06-25 (×2): 50 ug via INTRAVENOUS
  Filled 2013-06-24 (×3): qty 5

## 2013-06-24 MED ORDER — METOPROLOL TARTRATE 1 MG/ML IV SOLN
5.0000 mg | Freq: Four times a day (QID) | INTRAVENOUS | Status: DC
  Administered 2013-06-24 – 2013-06-25 (×3): 5 mg via INTRAVENOUS
  Filled 2013-06-24 (×7): qty 5

## 2013-06-24 MED ORDER — POTASSIUM CHLORIDE 10 MEQ/100ML IV SOLN
10.0000 meq | INTRAVENOUS | Status: AC
  Administered 2013-06-24 (×2): 10 meq via INTRAVENOUS
  Filled 2013-06-24 (×2): qty 100

## 2013-06-24 NOTE — Progress Notes (Signed)
Subjective: Abd pain continues.  Objective: Vital signs in last 24 hours: Temp:  [97.7 F (36.5 C)-99 F (37.2 C)] 97.7 F (36.5 C) (02/17 0417) Pulse Rate:  [85-115] 115 (02/17 0417) Resp:  [18] 18 (02/17 0417) BP: (101-129)/(44-85) 101/44 mmHg (02/17 0417) SpO2:  [93 %-97 %] 97 % (02/17 0417) Weight:  [157 lb 3 oz (71.3 kg)] 157 lb 3 oz (71.3 kg) (02/17 0417) Last BM Date: 06/24/13  Intake/Output from previous day: 02/16 0701 - 02/17 0700 In: 360 [P.O.:360] Out: 400 [Urine:400] Intake/Output this shift:    Medications Current Facility-Administered Medications  Medication Dose Route Frequency Provider Last Rate Last Dose  . acetaminophen (TYLENOL) suppository 325 mg  325 mg Rectal Q8H PRN Jessee Avers, MD   325 mg at 06/17/13 1618  . ciprofloxacin (CIPRO) tablet 500 mg  500 mg Oral BID Joni Reining, DO      . diazepam (VALIUM) tablet 2 mg  2 mg Oral Q12H PRN Joni Reining, DO   2 mg at 06/23/13 2250  . furosemide (LASIX) tablet 20 mg  20 mg Oral Once Joni Reining, DO      . HYDROcodone-acetaminophen (NORCO/VICODIN) 5-325 MG per tablet 1-2 tablet  1-2 tablet Oral Q4H PRN Joni Reining, DO   1 tablet at 06/23/13 1941  . levothyroxine (SYNTHROID, LEVOTHROID) tablet 100 mcg  100 mcg Oral Once per day on Sun Tue Wed Fri Sat Rocky Crafts Hammons, RPH   100 mcg at 06/21/13 7673  . magic mouthwash  5 mL Oral TID PRN Joni Reining, DO   5 mL at 06/23/13 1632  . magnesium sulfate IVPB 2 g 50 mL  2 g Intravenous Once Marjan Rabbani, MD      . metoprolol (LOPRESSOR) injection 2.5 mg  2.5 mg Intravenous Q5 min PRN Ejiroghene Emokpae, MD      . metoprolol (LOPRESSOR) tablet 50 mg  50 mg Oral BID Thompson Grayer, MD   50 mg at 06/23/13 2230  . metroNIDAZOLE (FLAGYL) tablet 500 mg  500 mg Oral 3 times per day Joni Reining, DO   500 mg at 06/23/13 1617  . pantoprazole (PROTONIX) EC tablet 40 mg  40 mg Oral Daily Jessee Avers, MD   40 mg at 06/23/13 1018  . phenol (CHLORASEPTIC) mouth  spray 1 spray  1 spray Mouth/Throat PRN Truman Hayward, MD   1 spray at 06/21/13 [phone]  . potassium chloride 10 mEq in 100 mL IVPB  10 mEq Intravenous Q1 Hr x 3 Thayer Headings, MD   10 mEq at 06/24/13 0905  . promethazine (PHENERGAN) injection 6.25 mg  6.25 mg Intravenous Q8H PRN Jessee Avers, MD   6.25 mg at 06/24/13 0435  . sodium chloride 0.9 % injection 3 mL  3 mL Intravenous Q12H Othella Boyer, MD   3 mL at 06/23/13 2255    PE: General appearance: alert, cooperative and mild distress Lungs: clear to auscultation bilaterally Heart: regular rate and rhythm, S1, S2 normal, no murmur, click, rub or gallop Extremities: No LEE Pulses: 2+ and symmetric Skin: Warm and dry Neurologic: Grossly normal  Lab Results:   Recent Labs  06/22/13 0638 06/23/13 0430  WBC 9.8 9.7  HGB 12.5 12.6  HCT 36.7 36.8  PLT 321 376   BMET  Recent Labs  06/23/13 0430 06/23/13 2318 06/24/13 0415  NA 136* 135* 135*  K 2.5* 2.5* 2.8*  CL 95* 95* 95*  CO2 23 24 23   GLUCOSE 79 114* 95  BUN 7 8 7   CREATININE 0.69 0.68 0.65  CALCIUM 7.5* 7.4* 7.4*   Cardiac Panel (last 3 results) No results found for this basename: CKTOTAL, CKMB, TROPONINI, RELINDX,  in the last 72 hours   Assessment/Plan  Principal Problem:   Diverticulitis of colon Active Problems:   Hypertension   Thyroid disease   Osteoarthritis   Hypokalemia   Elevated troponin   New left bundle branch block   Asymptomatic bacteriuria   Atrial fibrillation  Plan:  IV potassium x3 being given now.  Recheck lab at 1300hrs.  Afib rate is controlled to slow.  Short periods to tachycardia probably from increased abd pain.  BP stable and controlled.   Lopressor 50 bid.  No change in therapy.  CHADSVASC 5.  Prior GIB in 2012 and current diverticulitis hinder anticoagulation.   EF 40-45%, "Probable severe hypokinesis of the mid-distal anteroseptal myocardium - abnormal septal motion with LBBB likely also contributes."  Lexiscan when  improved.      LOS: 7 days    HAGER, BRYAN 06/24/2013 9:30 AM  I have seen and examined the patient along with Tarri Fuller, PA.  I have reviewed the chart, notes and new data.  I agree with PA's note.  Key new complaints: little change, abdominal pain is her major complaint Key examination changes: AF with satisfactory rate control Key new findings / data: still severely hypokalemic.  CT reviewed. While she does have aortic atherosclerosis, the celiac artery, SMA and IMA are all identified and widely patent on CT angio of the abdomen. There is minimal if any coronary calcification seen on the CT cuts.  PLAN: Will follow peripherally. Hard to provide much in the way of therapeutic interventions without worsening bleeding risk  The depressed LVEF and wall motion abnormalities could be entirely explained by the LBBB, but CAD needs to be excluded with a functional study.  Sanda Klein, MD, [place] [phone] 06/24/2013, 10:04 AM

## 2013-06-24 NOTE — Care Management Note (Signed)
    Page 1 of 2   06/26/2013     4:47:09 PM   CARE MANAGEMENT NOTE 06/26/2013  Patient:  Danielle [name], Danielle [name]   Account Number:  [id number]  Date Initiated:  06/18/2013  Documentation initiated by:  Marvetta Gibbons  Subjective/Objective Assessment:   Pt admitted with diverticulitis     Action/Plan:   PTA pt lived at home with spouse   Anticipated DC Date:  06/26/2013   Anticipated DC Plan:  [place]  In-house referral  Clinical Social Worker      DC Forensic scientist  CM consult      [facility] Choice  HOME HEALTH   Choice offered to / List presented to:  C-1 Patient   DME arranged  [place]      DME agency  [facility] arranged  HH-1 RN  [place]      [place].   Status of service:  Completed, signed off Medicare Important Message given?   (If response is "NO", the following Medicare IM given date fields will be blank) Date Medicare IM given:   Date Additional Medicare IM given:    Discharge Disposition:  [place]  Per UR Regulation:  Reviewed for med. necessity/level of care/duration of stay  If discussed at [place] of Stay Meetings, dates discussed:   06/24/2013  06/26/2013    Comments:  06/26/13 Gloria Ricardo,RN,BSN [phone] PT REFUSING SNF; [place].  MET WITH PT TO DISCUSS ARRANGEMENTS.  REFERRAL TO [facility], PER PT CHOICE.  START OF CARE 24-48H POST DC DATE.  3 IN 1 AND WALKER PROVIDED BY [facility] PRIOR TO DC.  06/24/13 Carlous Olivares,RN,BSN [phone] P.T. RECOMMENDING SNF AT DC; CSW FOLLOWING TO FACILITATE DC TO SNF WHEN MEDICALLY STABLE.

## 2013-06-24 NOTE — Consult Note (Signed)
Subjective:   HPI  The patient is an 80 year old female who was admitted to the hospital on February 10 with complaints of severe abdominal pain. She had a CT scan of the abdomen and pelvis which initially showed evidence of sigmoid diverticulitis. She also had on the same day a CT angiography which again showed diverticulitis of the sigmoid. There was also evidence of ileitis. The CT angiography did not reveal any significant stenosis of the celiac, superior mesenteric artery, or inferior mesenteric artery. There was however atherosclerotic disease. She has been treated for diverticulitis with antibiotics. She continues to have pain across her lower abdomen. She states that more so however the pain is in the right lower quadrant now. Her initial white blood cell count was 19,700 on admission but has decreased to 9700. A stool for Clostridium difficile toxin was negative. A stool GI pathogens panel is pending. On February 15 she had another CT scan which showed a long segment of bowel wall thickening of the distal ileum. There was also circumferential bowel wall thickening over a long segment of sigmoid colon which was approximately 12 cm in length. She also had diverticulosis. She has not responded that well to antibiotic therapy for her diverticulitis. She reports that in 2012 she had gastrointestinal bleeding and a colonoscopy was done which showed diverticulosis.  Review of Systems Denies chest pain or shortness of breath  Past Medical History  Diagnosis Date  . Osteoarthritis   . Macular degeneration   . Hypertension   . Thyroid disease   . Thyroid cancer   . GI bleed    Past Surgical History  Procedure Laterality Date  . Thyroidectomy    . Colonoscopy  03/17/2011    Procedure: COLONOSCOPY;  Surgeon: Rogene Houston, MD;  Location: [facility];  Service: Endoscopy;  Laterality: N/A;   History   Social History  . Marital Status: Married    Spouse Name: N/A    Number of Children: 2   . Years of Education: Post HS   Occupational History  . 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 Smoking status: Never Smoker   . Smokeless tobacco: Not on file  . Alcohol Use: No  . Drug Use: No  . Sexual Activity:    Other Topics Concern  . Not on file   Social History Narrative   Lives with her husband, active.   family history is not on file. Current facility-administered medications:acetaminophen (TYLENOL) suppository 325 mg, 325 mg, Rectal, Q8H PRN, Jessee Avers, MD, 325 mg at 06/17/13 1618;  ciprofloxacin (CIPRO) IVPB 400 mg, 400 mg, Intravenous, Q12H, Joni Reining, DO, 400 mg at 06/24/13 1428;  levothyroxine (SYNTHROID, LEVOTHROID) injection 50 mcg, 50 mcg, Intravenous, QAC breakfast, Joni Reining, DO, 50 mcg at 06/24/13 1419 magic mouthwash, 5 mL, Oral, TID PRN, Joni Reining, DO, 5 mL at 06/23/13 1632;  magnesium sulfate IVPB 2 g 50 mL, 2 g, Intravenous, Once, Marjan Rabbani, MD, 2 g at 06/24/13 1757;  metoprolol (LOPRESSOR) injection 5 mg, 5 mg, Intravenous, 4 times per day, Joni Reining, DO, 5 mg at 06/24/13 1544;  metroNIDAZOLE (FLAGYL) IVPB 500 mg, 500 mg, Intravenous, Q8H, Joni Reining, DO, 500 mg at 06/24/13 1426 morphine 4 MG/ML injection 4 mg, 4 mg, Intravenous, Q4H PRN, Joni Reining, DO, 4 mg at 06/24/13 1542;  pantoprazole (PROTONIX) injection 40 mg, 40 mg, Intravenous, Q24H, Joni Reining, DO, 40 mg at 06/24/13  1550;  phenol (CHLORASEPTIC) mouth spray 1 spray, 1 spray, Mouth/Throat, PRN, Truman Hayward, MD, 1 spray at 06/21/13 0237 promethazine (PHENERGAN) injection 6.25 mg, 6.25 mg, Intravenous, Q8H PRN, Jessee Avers, MD, 6.25 mg at 06/24/13 1605;  sodium chloride 0.9 % injection 3 mL, 3 mL, Intravenous, Q12H, Othella Boyer, MD, 3 mL at 06/24/13 1036 Allergies  Allergen Reactions  . Thyroid Hormones     proparathyroid thyroid medication     Objective:     BP 133/58  Pulse 72  Temp(Src) 98.1  F (36.7 C) (Oral)  Resp 18  Ht 5\' 7"  (1.702 m)  Wt 71.3 kg (157 lb 3 oz)  BMI 24.61 kg/m2  SpO2 99%  She is in no acute distress  Nonicteric  Heart regular rhythm no murmurs  Lungs clear  Abdomen: Bowel sounds are present, soft, tenderness is present in the right lower quadrant and left lower quadrant.   Laboratory No components found with this basename: d1      Assessment:     #1. Inflammatory response in the sigmoid colon presumed to be from diverticulitis, however she has not responded very well to antibiotic therapy.  #2. Inflammatory response in the distal ileum of uncertain etiology.  #3. No evidence of significant stenosis on CT scan of the celiac, superior mesenteric, or inferior mesenteric artery.  Comments: It is possible that this patient has 2 different processes going on, in other words diverticulitis, and ileitis. However it would be nice to wrap this up into one etiology. I doubt that she has suddenly developed diverticulitis and regional ileitis at the same time. She does not have significant stenosis of the celiac, SMA, or IMA for this to be ischemic from large vessel disease, although ischemia  could still be possible perhaps from a small vessel vasculitis . An infectious etiology is also possible and a GI pathogens panel is pending.      Plan:     Continue to follow clinically. Continue to treat medically as has been done. Await GI pathogens panel. I will consider sigmoidoscopy to evaluate the inflammatory area in the sigmoid colon.    Component Value Date/Time   WBC 9.7 06/23/2013 0430   HGB 12.6 06/23/2013 0430   HCT 36.8 06/23/2013 0430   PLT 376 06/23/2013 0430   ALT 16 06/18/2013 0222   AST 23 06/18/2013 0222   NA 135* 06/24/2013 0415   K 3.0* 06/24/2013 1525   CL 95* 06/24/2013 0415   CREATININE 0.65 06/24/2013 0415   CREATININE 0.99 03/28/2011 1048   BUN 7 06/24/2013 0415   CO2 23 06/24/2013 0415   CALCIUM 7.4* 06/24/2013 0415   ALKPHOS 67 06/18/2013  0222

## 2013-06-24 NOTE — Progress Notes (Signed)
Physical Therapy Treatment Danielle [name] Details Name: Danielle [name] MRN: [id number] DOB: 1930-07-31 Today's Date: 06/24/2013 Time: 7829-5621 Danielle [name] Time Calculation (min): 17 min  Danielle [name] Assessment / Plan / Recommendation  History of Present Illness Danielle [name] is a 80yo woman with history of HTN & hypothyroidism (s/p thyroidectomy d/t thyroid Ca) who presents with 2 days of worsening upper abdominal pain described as 10/10, sore and crampy.  Not similar to anything she has experienced in the past, but was hospitalized in 2012 with diverticulitis. Pain was severe to the point of passing out last night.  She had an episode of emesis at 1 am (unclear if bloody) and [name] reports another episode since arrival at the hospital. She reports non bloody diarrhea, but unable to quantify number of episodes or when it started.  Danielle [name] reports she ate a good supper last night. Denies fever/chills. Denies chest pain, SOB, palpitations. Reports lightheadedness/dizziness. Denies NSAID use.  She also reports she is not taking a PPI.   Danielle [name] Comments   Progressing, but slow due to Danielle [name] has had no nutrition and is very weak.  Would do well with [facility] [name] at SNF, but will likely refuse in favor of going straight home with [name]'s assist.   Follow Up Recommendations  SNF;Supervision/Assistance - 24 hour;Supervision for mobility/OOB;Other (comment) (unless gets much closer to baseline)     Does the Danielle [name] have the potential to tolerate intense rehabilitation     Barriers to Discharge        Equipment Recommendations  Other (comment)    Recommendations for Other Services    Frequency Min 2X/week   Progress towards Danielle [name] Goals Progress towards Danielle [name] goals: Progressing toward goals  Plan Discharge plan needs to be updated    Precautions / Restrictions Precautions Precautions: Fall   Pertinent Vitals/Pain 4-5/10 abdominal pain    Mobility  Bed Mobility Overal bed mobility: Modified Independent Supine to sit: Modified  independent (Device/Increase time) Sit to supine: Modified independent (Device/Increase time) Transfers Overall transfer level: Needs assistance Transfers: Sit to/from Stand Sit to Stand: Supervision General transfer comment: cues for transfer safety Ambulation/Gait Ambulation/Gait assistance: Supervision Ambulation Distance (Feet): 200 Feet Assistive device: Rolling walker (2 wheeled) Gait Pattern/deviations: Step-through pattern Gait velocity: slow General Gait Details: gait mildly unsteady and guarded, but expect will improve quickly when Danielle [name] gets nutrition onboard    Exercises     Danielle [name] Diagnosis:    Danielle [name] Problem List:   Danielle [name] Treatment Interventions:     Danielle [name] Goals (current goals can now be found in the care plan section) Acute Rehab Danielle [name] Goals Danielle [name] Goal Formulation: With Danielle [name] Time For Goal Achievement: 07/03/13 Potential to Achieve Goals: Good  Visit Information  Last Danielle [name] Received On: 06/24/13 Assistance Needed: +1 History of Present Illness: Danielle [name] is a 80yo woman with history of HTN & hypothyroidism (s/p thyroidectomy d/t thyroid Ca) who presents with 2 days of worsening upper abdominal pain described as 10/10, sore and crampy.  Not similar to anything she has experienced in the past, but was hospitalized in 2012 with diverticulitis. Pain was severe to the point of passing out last night.  She had an episode of emesis at 1 am (unclear if bloody) and [name] reports another episode since arrival at the hospital. She reports non bloody diarrhea, but unable to quantify number of episodes or when it started.  Danielle [name] reports she ate a good supper last night. Denies fever/chills. Denies chest pain, SOB, palpitations. Reports lightheadedness/dizziness. Denies NSAID use.  She also reports she is not taking a PPI.    Subjective Data  Subjective: I've been miserable.Marland KitchenMarland KitchenI haven't eaten anything in 7 days, it's a wonder I can do this   Cognition  Cognition Arousal/Alertness:  Awake/alert Behavior During Therapy: WFL for tasks assessed/performed Overall Cognitive Status: Within Functional Limits for tasks assessed    Balance  Balance Overall balance assessment: No apparent balance deficits (not formally assessed) Sitting balance-Leahy Scale: Good Standing balance-Leahy Scale: Fair  End of Session Danielle [name] - End of Session Activity Tolerance: Danielle [name] tolerated treatment well Danielle [name] left: in bed;with bed alarm set Nurse Communication: Mobility status   GP     Samel Bruna, Tessie Fass 06/24/2013, 3:28 PM

## 2013-06-24 NOTE — Progress Notes (Signed)
Patient seen and examined and agree with note as outlined by resident team.   Scharlene Gloss, MD

## 2013-06-24 NOTE — Progress Notes (Signed)
Internal Medicine Teaching Service Daily Progress Note  Subjective: Overnight team called that patient was refusing to take PO Antibiotics and gave PM dose by IV.  Patient complaining again of severe abdominal pain.  Reports was not able to tolerate diet ordered last night.  Still has some mild diarrhea. Objective: Vital signs in last 24 hours: Filed Vitals:   06/23/13 1017 06/23/13 1327 06/23/13 2225 06/24/13 0417  BP: 129/85 124/79 127/65 101/44  Pulse: 89 85 85 115  Temp:  99 F (37.2 C) 98.3 F (36.8 C) 97.7 F (36.5 C)  TempSrc:  Oral Oral Oral  Resp:  18 18 18   Height:      Weight:    157 lb 3 oz (71.3 kg)  SpO2:  94% 93% 97%   Weight change: -2 lb 11.4 oz (-1.23 kg)  Intake/Output Summary (Last 24 hours) at 06/24/13 1032 Last data filed at 06/23/13 1821  Gross per 24 hour  Intake    120 ml  Output    300 ml  Net   -180 ml   General: initially patient was on bedside commode, returned later, patient in bed resting HEENT: EOMI Cardiac:  Irregular, 2/6 systolic murmur Pulm: mildly decrease bibasilar breath sounds Abd:  Moderate RLQ tenderness, no rebound, hyperactive bowel sounds Ext: no pedal edema Neuro: AAOx4  Lab Results: Basic Metabolic Panel:  Recent Labs Lab 06/23/13 2318 06/24/13 0415  NA 135* 135*  K 2.5* 2.8*  CL 95* 95*  CO2 24 23  GLUCOSE 114* 95  BUN 8 7  CREATININE 0.68 0.65  CALCIUM 7.4* 7.4*  MG 1.4* 1.4*   Liver Function Tests:  Recent Labs Lab 06/18/13 0222  AST 23  ALT 16  ALKPHOS 67  BILITOT 1.5*  PROT 5.2*  ALBUMIN 2.3*   CBC:  Recent Labs Lab 06/22/13 0638 06/23/13 0430  WBC 9.8 9.7  HGB 12.5 12.6  HCT 36.7 36.8  MCV 85.5 85.4  PLT 321 376   Cardiac Enzymes:  Recent Labs Lab 06/19/13 2205 06/20/13 0407 06/20/13 0926  TROPONINI 0.31* <0.30 <0.30    Micro Results: Recent Results (from the past 240 hour(s))  CULTURE, BLOOD (ROUTINE X 2)     Status: None   Collection Time    06/17/13  4:45 PM      Result  Value Ref Range Status   Specimen Description BLOOD RIGHT HAND   Final   Special Requests BOTTLES DRAWN AEROBIC AND ANAEROBIC 10CC   Final   Culture  Setup Time     Final   Value: 06/17/2013 22:11     Performed at [facility]   Culture     Final   Value: NO GROWTH 5 DAYS     Performed at [facility]   Report Status 06/23/2013 FINAL   Final  CULTURE, BLOOD (ROUTINE X 2)     Status: None   Collection Time    06/17/13  4:54 PM      Result Value Ref Range Status   Specimen Description BLOOD LEFT HAND   Final   Special Requests BOTTLES DRAWN AEROBIC ONLY 5CC   Final   Culture  Setup Time     Final   Value: 06/17/2013 22:11     Performed at [facility]   Culture     Final   Value: NO GROWTH 5 DAYS     Performed at [facility]   Report Status 06/23/2013 FINAL   Final  MRSA PCR SCREENING  Status: None   Collection Time    06/17/13  6:53 PM      Result Value Ref Range Status   MRSA by PCR NEGATIVE  NEGATIVE Final   Comment:            The GeneXpert MRSA Assay (FDA     approved for NASAL specimens     only), is one component of a     comprehensive MRSA colonization     surveillance program. It is not     intended to diagnose MRSA     infection nor to guide or     monitor treatment for     MRSA infections.  CLOSTRIDIUM DIFFICILE BY PCR     Status: None   Collection Time    06/20/13  5:15 PM      Result Value Ref Range Status   C difficile by pcr NEGATIVE  NEGATIVE Final   Studies/Results: Ct Abdomen Pelvis W Contrast  06/22/2013   CLINICAL DATA:  Right lower quadrant pain. Patient receiving therapy for diverticulitis.  EXAM: CT ABDOMEN AND PELVIS WITH CONTRAST  TECHNIQUE: Multidetector CT imaging of the abdomen and pelvis was performed using the standard protocol following bolus administration of intravenous contrast.  CONTRAST:  126mL OMNIPAQUE IOHEXOL 300 MG/ML  SOLN  COMPARISON:  CT CTA ABD/PEL W/CM AND/OR W/O CM dated 06/17/2013; CT  ABD/PELV WO CM dated 06/17/2013  FINDINGS: There is interval increase in small bilateral pleural effusions. Mild basilar atelectasis. No evidence of pneumonia. No pericardial fluid. The heart is enlarged.  No focal hepatic lesion. There is fatty infiltration along the falciform ligament. Gallbladder, pancreas, spleen, adrenal glands, and kidneys are normal. Nonenhancing cyst in the left kidney.  Stomach, small bowel, and cecum are unchanged. There is a long segment of bowel wall thickening involving the distal ileum leading up to the terminal ileum (image 49) for example. The ascending colon transverse colon are normal. There are diverticula of the descending colon. Again demonstrated a circumferential bowel wall thickening and mucosal thickening over a long segment of sigmoid colon approximately 12 cm (image 67). This is similar prior. There is mild pericolonic inflammation approximately associated with this segment of bowel wall thickening. Contrast flows to entirety the colon the rectum which appears normal.  Bowel aorta is normal caliber. No retroperitoneal periportal lymphadenopathy.  Again demonstrated a soft tissue mass adjacent to the sigmoid colon measuring 2.5 x 1.5 cm not changed from prior. Medially adjacent there is a of peripherally calcified mass measuring 3.0 x 3.1 cm unchanged from prior.  No aggressive osseous lesion.  IMPRESSION: 1. Interval increase in bilateral pleural effusions. No evidence of pneumonia. 2. Persistent long segment of distal small bowel wall thickening leading up to terminal ileum. Favor terminal ileitis but cannot exclude inflammatory bowel disease. 3. Long segment of bowel wall thickening involving the proximal sigmoid colon with mild pericolonic thickening consistent with diverticulitis. No evidence of progression compared to prior. 4. Soft tissue and calcified lesions in the left adnexal region are unchanged from prior   Electronically Signed   By: Suzy Bouchard M.D.   On:  06/22/2013 20:50   Medications: I have reviewed the patient's current medications. Scheduled Meds: . ciprofloxacin  500 mg Oral BID  . furosemide  20 mg Oral Once  . levothyroxine  100 mcg Oral Once per day on Sun Tue Wed Fri Sat  . magnesium sulfate 1 - 4 g bolus IVPB  2 g Intravenous Once  . metoprolol tartrate  50 mg Oral BID  . metroNIDAZOLE  500 mg Oral 3 times per day  . pantoprazole  40 mg Oral Daily  . potassium chloride  10 mEq Intravenous Q1 Hr x 3  . sodium chloride  3 mL Intravenous Q12H   Continuous Infusions:   PRN Meds:.acetaminophen, diazepam, magic mouthwash, metoprolol, morphine injection, phenol, promethazine Assessment/Plan:    Diverticulitis of colon- improving - NPO - Change to IV Cipro and Flagyl to complete 10 day total course -Return to morphine 4mg  Q4PRN - Will consult GI given patinet's lack of clinical improvement despite appropriate antibiotics  - If pain worsens will reconsult Surgery  New onset Atrial Fibrillation - Rate control: Previously controlled with Metoprolol 50BID (not taking any meds by mouth currently) will change to IV Metoprolol 10mg  Q6 - Anticoagulation Not a candidate due to GIB  - CHA2DS2-VASc score = 4 -Patient does have B/L pleural effusions on CT abdomen, continue PO lasix - Patient will need to readdress A/C after colonoscopy    Troponin elevation with new LBBB - Cardiology to follow peripherally plan for lexiscan when stable, (can be done as outpatient) -Not candidate for ASA or A/C due to acute GIB - Patient on Metoprolol  Diarrhea -C diff negative -Continue to monitor    Hypertension - Antihypertensives held given Acute GIB, hypotension. - Metoprolol 50mg  BID for Afib. -restart amlodipine 5mg  if hypertensive.    Thyroid disease  synthroid 174mcg PO home dose - will change to IV.    Hypokalemia-  - K+ 2.8 this AM - Likely secondary to diarrhea, poor PO intake and lasix` - Overnight team ordered 5 runs KCl  pharmacy changed to 3 runs. - Continue to monitor and replace (will likely need more than 3 runs) - BMP BID    Hypomagnesemia Mg 1.4 >> given 2g IV Mag   Diet: NPO >>> can advance to Clear liquids if patient requests VTE PPx: SCDs Code status: Full Dispo: Disposition is deferred at this time, awaiting improvement of current medical problems.   The patient does have a current PCP (Richard Marcia Brash, MD) and does not need an [facility] hospital follow-up appointment after discharge.  The patient does not have transportation limitations that hinder transportation to clinic appointments.  .Services Needed at time of discharge: Y = Yes, Blank = No PT:   OT:   RN:   Equipment:   Other:     LOS: 7 days   Joni Reining, DO 06/24/2013, 10:32 AM

## 2013-06-25 DIAGNOSIS — A0472 Enterocolitis due to Clostridium difficile, not specified as recurrent: Secondary | ICD-10-CM

## 2013-06-25 LAB — BASIC METABOLIC PANEL
BUN: 5 mg/dL — AB (ref 6–23)
CO2: 24 mEq/L (ref 19–32)
Calcium: 7.1 mg/dL — ABNORMAL LOW (ref 8.4–10.5)
Chloride: 100 mEq/L (ref 96–112)
Creatinine, Ser: 0.6 mg/dL (ref 0.50–1.10)
GFR calc Af Amer: >90 mL/min (ref >90)
GFR, EST NON AFRICAN AMERICAN: 84 mL/min — AB (ref >90)
Glucose, Bld: 92 mg/dL (ref 70–99)
POTASSIUM: 3.1 meq/L — AB (ref 3.7–5.3)
Sodium: 135 mEq/L — ABNORMAL LOW (ref 137–147)

## 2013-06-25 LAB — MAGNESIUM: MAGNESIUM: 1.8 mg/dL (ref 1.5–2.5)

## 2013-06-25 MED ORDER — BOOST / RESOURCE BREEZE PO LIQD
1.0000 | Freq: Three times a day (TID) | ORAL | Status: DC
  Administered 2013-06-26 (×2): 1 via ORAL

## 2013-06-25 MED ORDER — DIAZEPAM 2 MG PO TABS
2.0000 mg | ORAL_TABLET | Freq: Two times a day (BID) | ORAL | Status: DC | PRN
  Administered 2013-06-25 – 2013-06-26 (×2): 2 mg via ORAL
  Filled 2013-06-25 (×3): qty 1

## 2013-06-25 MED ORDER — METOPROLOL TARTRATE 50 MG PO TABS
50.0000 mg | ORAL_TABLET | Freq: Two times a day (BID) | ORAL | Status: DC
  Administered 2013-06-25 – 2013-06-26 (×3): 50 mg via ORAL
  Filled 2013-06-25 (×4): qty 1

## 2013-06-25 MED ORDER — HYDROCODONE-ACETAMINOPHEN 7.5-325 MG PO TABS
1.0000 | ORAL_TABLET | Freq: Four times a day (QID) | ORAL | Status: DC | PRN
  Administered 2013-06-25 – 2013-06-26 (×3): 1 via ORAL
  Filled 2013-06-25 (×3): qty 1

## 2013-06-25 MED ORDER — POTASSIUM CHLORIDE 20 MEQ/15ML (10%) PO LIQD
40.0000 meq | Freq: Once | ORAL | Status: AC
  Administered 2013-06-25: 40 meq via ORAL
  Filled 2013-06-25: qty 30

## 2013-06-25 MED ORDER — DICYCLOMINE HCL 10 MG PO CAPS
10.0000 mg | ORAL_CAPSULE | Freq: Three times a day (TID) | ORAL | Status: DC
  Administered 2013-06-25 – 2013-06-26 (×4): 10 mg via ORAL
  Filled 2013-06-25 (×7): qty 1

## 2013-06-25 MED ORDER — PROMETHAZINE HCL 25 MG PO TABS
12.5000 mg | ORAL_TABLET | Freq: Four times a day (QID) | ORAL | Status: DC | PRN
  Administered 2013-06-25 – 2013-06-26 (×2): 12.5 mg via ORAL
  Filled 2013-06-25 (×2): qty 1

## 2013-06-25 MED ORDER — VANCOMYCIN 50 MG/ML ORAL SOLUTION
125.0000 mg | Freq: Four times a day (QID) | ORAL | Status: DC
  Administered 2013-06-25 – 2013-06-26 (×4): 125 mg via ORAL
  Filled 2013-06-25 (×7): qty 2.5

## 2013-06-25 MED ORDER — WHITE PETROLATUM GEL
Status: DC | PRN
  Filled 2013-06-25: qty 5

## 2013-06-25 MED ORDER — POTASSIUM CHLORIDE 10 MEQ/100ML IV SOLN
10.0000 meq | INTRAVENOUS | Status: AC
  Administered 2013-06-25 (×4): 10 meq via INTRAVENOUS
  Filled 2013-06-25 (×4): qty 100

## 2013-06-25 MED ORDER — LEVOTHYROXINE SODIUM 100 MCG PO TABS
100.0000 ug | ORAL_TABLET | Freq: Every day | ORAL | Status: DC
  Filled 2013-06-25 (×2): qty 1

## 2013-06-25 NOTE — Progress Notes (Signed)
The patient had a repeat C. difficile toxin which was positive. Her Cipro and Flagyl were stopped and she has been started on vancomycin. The presence of C. difficile could explain why she was not improving. I agree with the use of vancomycin at this time. I see no indication at the present time to do another colonoscopy. We will see her again if requested.

## 2013-06-25 NOTE — Progress Notes (Signed)
Internal Medicine Teaching Service Daily Progress Note  Subjective: Patient feeling a little better, reports she has been able to tolerate some clear liquids, reports she is craving a [employer] sausage and egg sandwich. right now but does not want to advance diet fearing worsened abdominal pain.  She reports she is willing to take some of her meds PO but not all of the.  She reports last episode of diarrhea was last night.  Objective: Vital signs in last 24 hours: Filed Vitals:   06/24/13 0417 06/24/13 1409 06/24/13 2028 06/25/13 0542  BP: 101/44 133/58 124/56 126/62  Pulse: 115 72 75 70  Temp: 97.7 F (36.5 C) 98.1 F (36.7 C) 98.6 F (37 C) 98.2 F (36.8 C)  TempSrc: Oral Oral Oral Oral  Resp: 18 18 18 18   Height:      Weight: 157 lb 3 oz (71.3 kg)   161 lb 6 oz (73.2 kg)  SpO2: 97% 99% 98% 96%   Weight change: 4 lb 3 oz (1.9 kg)  Intake/Output Summary (Last 24 hours) at 06/25/13 0710 Last data filed at 06/25/13 0308  Gross per 24 hour  Intake    480 ml  Output    851 ml  Net   -371 ml   General: resting in bed NAD HEENT: EOMI Cardiac:  Irregular, 2/6 systolic murmur Pulm: CTA b/l Abd:  Mild RLQ tenderness, no rebound, normoactive bowel sounds Ext: no pedal edema Neuro: AAOx4  Lab Results: Basic Metabolic Panel:  Recent Labs Lab 06/24/13 0415  06/24/13 1941 06/25/13 0419  NA 135*  --  138 135*  K 2.8*  < > 3.0* 3.1*  CL 95*  --  99 100  CO2 23  --  24 24  GLUCOSE 95  --  83 92  BUN 7  --  6 5*  CREATININE 0.65  --  0.63 0.60  CALCIUM 7.4*  --  7.2* 7.1*  MG 1.4*  --   --  1.8  < > = values in this interval not displayed. Liver Function Tests: No results found for this basename: AST, ALT, ALKPHOS, BILITOT, PROT, ALBUMIN,  in the last 168 hours CBC:  Recent Labs Lab 06/22/13 0638 06/23/13 0430  WBC 9.8 9.7  HGB 12.5 12.6  HCT 36.7 36.8  MCV 85.5 85.4  PLT 321 376   Cardiac Enzymes:  Recent Labs Lab 06/19/13 2205 06/20/13 0407 06/20/13 0926   TROPONINI 0.31* <0.30 <0.30    Micro Results: Recent Results (from the past 240 hour(s))  CULTURE, BLOOD (ROUTINE X 2)     Status: None   Collection Time    06/17/13  4:45 PM      Result Value Ref Range Status   Specimen Description BLOOD RIGHT HAND   Final   Special Requests BOTTLES DRAWN AEROBIC AND ANAEROBIC 10CC   Final   Culture  Setup Time     Final   Value: 06/17/2013 22:11     Performed at [facility]   Culture     Final   Value: NO GROWTH 5 DAYS     Performed at [facility]   Report Status 06/23/2013 FINAL   Final  CULTURE, BLOOD (ROUTINE X 2)     Status: None   Collection Time    06/17/13  4:54 PM      Result Value Ref Range Status   Specimen Description BLOOD LEFT HAND   Final   Special Requests BOTTLES DRAWN AEROBIC ONLY 5CC  Final   Culture  Setup Time     Final   Value: 06/17/2013 22:11     Performed at [facility]   Culture     Final   Value: NO GROWTH 5 DAYS     Performed at [facility]   Report Status 06/23/2013 FINAL   Final  MRSA PCR SCREENING     Status: None   Collection Time    06/17/13  6:53 PM      Result Value Ref Range Status   MRSA by PCR NEGATIVE  NEGATIVE Final   Comment:            The GeneXpert MRSA Assay (FDA     approved for NASAL specimens     only), is one component of a     comprehensive MRSA colonization     surveillance program. It is not     intended to diagnose MRSA     infection nor to guide or     monitor treatment for     MRSA infections.  CLOSTRIDIUM DIFFICILE BY PCR     Status: None   Collection Time    06/20/13  5:15 PM      Result Value Ref Range Status   C difficile by pcr NEGATIVE  NEGATIVE Final   Studies/Results: No results found. Medications: I have reviewed the patient's current medications. Scheduled Meds: . levothyroxine  50 mcg Intravenous QAC breakfast  . metoprolol  5 mg Intravenous 4 times per day  . metronidazole  500 mg Intravenous Q8H  . pantoprazole  (PROTONIX) IV  40 mg Intravenous Q24H  . potassium chloride  10 mEq Intravenous Q1 Hr x 4  . sodium chloride  3 mL Intravenous Q12H   Continuous Infusions:   PRN Meds:.acetaminophen, magic mouthwash, morphine injection, phenol, promethazine Assessment/Plan:    Diverticulitis of colon- with concominate  C diff C diff PCR neg on 06/20/13 (more sensitive and specific), C diff Toxin A/B positive 06/23/13  - Appreciate GI recs - Regular diet - morphine 4mg  Q4PRN will try to transition to PO meds in anticipation of discharge - D/C Cipro Flagyl   New onset Atrial Fibrillation - Rate control: Previously controlled with Metoprolol 50BID - Anticoagulation Not a candidate due to GIB  - CHA2DS2-VASc score = 4 - Patient will need to readdress A/C after colonoscopy    Troponin elevation with new LBBB - Cardiology to follow peripherally plan for lexiscan when stable, (can be done as outpatient) - Not candidate for ASA or A/C due to acute GIB - Patient on Metoprolol  Diarrhea (resolved?) -C diff PCR negative but C diff toxin A/B positive 2 days later - Will D/C abx and start Oral Vanc for 14 days.    Hypertension - Antihypertensives held given Acute GIB, hypotension. - Metoprolol 50mg  BID for Afib. -restart amlodipine 5mg  if hypertensive.    Thyroid disease  synthroid 160mcg PO home dose - will change to IV.    Hypokalemia- improving - K+ 3.1 this AM - Likely secondary to diarrhea, poor PO intake and lasix` - Overnight team already has ordered 4 runs of potassium. - BMP daily    Hypomagnesemia resolved Mg 1.8    Diet: Regular VTE PPx: SCDs Code status: Full Dispo: Disposition is deferred at this time, awaiting improvement of current medical problems. Needs to be able to tolerate PO, likely needs SNF placement  The patient does have a current PCP Delfino Lovett Marcia Brash, MD) and does not need an  [facility] hospital follow-up appointment after discharge.  The patient does not have  transportation limitations that hinder transportation to clinic appointments.  .Services Needed at time of discharge: Y = Yes, Blank = No PT: y  OT:   RN:   Equipment:   Other:     LOS: 8 days   Joni Reining, DO 06/25/2013, 7:10 AM

## 2013-06-25 NOTE — Progress Notes (Signed)
  Date: 06/25/2013  Patient name: Danielle [name]  Medical record number: [id number]  Date of birth: 1930-09-23   This patient has been seen and the plan of care was discussed with the house staff. Please see their note for complete details. I concur with their findings with no changes.    Thayer Headings, MD 06/25/2013, 3:04 PM

## 2013-06-25 NOTE — Progress Notes (Addendum)
NUTRITION FOLLOW UP  Intervention:   Resource Breeze po TID, each supplement provides 250 kcal and 9 grams of protein RD to follow for nutrition care plan  Nutrition Dx:   Inadequate oral intake now related to poor appetite as evidenced by patient report, ongoing  Goal:   Pt to meet >/= 90% of their estimated nutrition needs, progressing  Monitor:   PO & supplemental intake, weight, labs, I/O's  Assessment:   PMHx significant for HTN, hypothyroidism. Admitted with worsening upper abdominal pain x 2 days. Work-up reveals sigmoid colitis, possibly acute diverticulitis.  Patient's diet has fluctuated from Clear Liquids to solids on and off since admission.  Currently on a Regular diet with variable PO intake at 0-100% per flowsheet records.  Reports she tried to eat today, however, she stated "it went right through me".  Amenable to Resource Breeze supplements.  She also would like chicken salad or potato soup added to her dinner tray -- RD arranged.  Height: Ht Readings from Last 1 Encounters:  06/17/13 5\' 7"  (1.702 m)    Weight Status:   Wt Readings from Last 1 Encounters:  06/25/13 161 lb 6 oz (73.2 kg)    Re-estimated needs:  Kcal: 1550-1750 Protein: 95-110 gm Fluid: 1.5-1.7 L  Skin: Intact  Diet Order: General   Intake/Output Summary (Last 24 hours) at 06/25/13 1339 Last data filed at 06/25/13 1027  Gross per 24 hour  Intake   1020 ml  Output    775 ml  Net    245 ml    Labs:   Recent Labs Lab 06/23/13 2318 06/24/13 0415 06/24/13 1525 06/24/13 1941 06/25/13 0419  NA 135* 135*  --  138 135*  K 2.5* 2.8* 3.0* 3.0* 3.1*  CL 95* 95*  --  99 100  CO2 24 23  --  24 24  BUN 8 7  --  6 5*  CREATININE 0.68 0.65  --  0.63 0.60  CALCIUM 7.4* 7.4*  --  7.2* 7.1*  MG 1.4* 1.4*  --   --  1.8  GLUCOSE 114* 95  --  83 92    Scheduled Meds: . [START ON 06/26/2013] levothyroxine  100 mcg Oral QAC breakfast  . metoprolol tartrate  50 mg Oral BID  . sodium  chloride  3 mL Intravenous Q12H  . vancomycin  125 mg Oral QID    Continuous Infusions:   Arthur Holms, RD, LDN Pager #: [phone] After-Hours Pager #: [phone]

## 2013-06-26 ENCOUNTER — Other Ambulatory Visit: Payer: Self-pay | Admitting: Cardiology

## 2013-06-26 DIAGNOSIS — I519 Heart disease, unspecified: Secondary | ICD-10-CM

## 2013-06-26 DIAGNOSIS — R7989 Other specified abnormal findings of blood chemistry: Secondary | ICD-10-CM

## 2013-06-26 DIAGNOSIS — A0472 Enterocolitis due to Clostridium difficile, not specified as recurrent: Secondary | ICD-10-CM | POA: Diagnosis not present

## 2013-06-26 DIAGNOSIS — R778 Other specified abnormalities of plasma proteins: Secondary | ICD-10-CM

## 2013-06-26 LAB — BASIC METABOLIC PANEL
BUN: 3 mg/dL — ABNORMAL LOW (ref 6–23)
CHLORIDE: 100 meq/L (ref 96–112)
CO2: 25 meq/L (ref 19–32)
Calcium: 7.3 mg/dL — ABNORMAL LOW (ref 8.4–10.5)
Creatinine, Ser: 0.6 mg/dL (ref 0.50–1.10)
GFR calc Af Amer: >90 mL/min (ref >90)
GFR calc non Af Amer: 84 mL/min — ABNORMAL LOW (ref >90)
GLUCOSE: 94 mg/dL (ref 70–99)
POTASSIUM: 3.2 meq/L — AB (ref 3.7–5.3)
Sodium: 138 mEq/L (ref 137–147)

## 2013-06-26 MED ORDER — VANCOMYCIN 50 MG/ML ORAL SOLUTION: 125.0000 mg | Freq: Four times a day (QID) | ORAL | Status: DC

## 2013-06-26 MED ORDER — POTASSIUM CHLORIDE ER 20 MEQ PO TBCR: 20.0000 meq | EXTENDED_RELEASE_TABLET | Freq: Every day | ORAL | Status: DC

## 2013-06-26 MED ORDER — BOOST / RESOURCE BREEZE PO LIQD: 1.0000 | Freq: Three times a day (TID) | ORAL | Status: DC

## 2013-06-26 MED ORDER — METOPROLOL TARTRATE 50 MG PO TABS: 50.0000 mg | ORAL_TABLET | Freq: Two times a day (BID) | ORAL | Status: DC

## 2013-06-26 MED ORDER — DICYCLOMINE HCL 10 MG PO CAPS: 10.0000 mg | ORAL_CAPSULE | Freq: Three times a day (TID) | ORAL | Status: DC

## 2013-06-26 MED ORDER — POTASSIUM CHLORIDE 20 MEQ/15ML (10%) PO LIQD
40.0000 meq | Freq: Once | ORAL | Status: AC
  Administered 2013-06-26: 40 meq via ORAL
  Filled 2013-06-26: qty 30

## 2013-06-26 MED ORDER — AMLODIPINE BESYLATE 5 MG PO TABS
5.0000 mg | ORAL_TABLET | Freq: Every day | ORAL | Status: DC
  Filled 2013-06-26: qty 1

## 2013-06-26 NOTE — Progress Notes (Signed)
Please let us know when patient is being discharged so we can arrange Lexiscan nuclear stress test in our office. Dayna Dunn PA-C

## 2013-06-26 NOTE — Discharge Summary (Signed)
Name: Danielle [name] MRN: [id number] DOB: 1931-01-06 80 y.o. PCP: [name], MD  Date of Admission: 06/17/2013  6:56 AM Date of Discharge: 06/26/2013 Attending Physician: Thayer Headings, MD  Discharge Diagnosis: Principal Problem:   Diverticulitis of colon Active Problems:   Hypertension   Thyroid disease   Osteoarthritis   Hypokalemia   Elevated troponin   New left bundle branch block   Asymptomatic bacteriuria   Atrial fibrillation   Clostridium difficile enteritis  Discharge Medications:   Medication List         amLODipine 5 MG tablet  Commonly known as:  NORVASC  Take 5 mg by mouth daily.     diazepam 5 MG tablet  Commonly known as:  VALIUM  Take 2.5 mg by mouth 2 (two) times daily as needed for anxiety.     dicyclomine 10 MG capsule  Commonly known as:  BENTYL  Take 1 capsule (10 mg total) by mouth 4 (four) times daily -  before meals and at bedtime.     feeding supplement (RESOURCE BREEZE) Liqd  Take 1 Container by mouth 3 (three) times daily between meals.     HYDROcodone-acetaminophen 5-325 MG per tablet  Commonly known as:  NORCO/VICODIN  Take 1 tablet by mouth every 6 (six) hours as needed for moderate pain.     levothyroxine 100 MCG tablet  Commonly known as:  SYNTHROID, LEVOTHROID  Take 100 mcg by mouth daily. Do not take synthroid on Mon and Thursdays     metoprolol 50 MG tablet  Commonly known as:  LOPRESSOR  Take 1 tablet (50 mg total) by mouth 2 (two) times daily.     Potassium Chloride ER 20 MEQ Tbcr  Take 20 mEq by mouth daily.     vancomycin 50 mg/mL oral solution  Commonly known as:  VANCOCIN  Take 2.5 mLs (125 mg total) by mouth 4 (four) times daily.        Disposition and follow-up:   Danielle [name] was discharged from [facility] in Stable condition.  At the hospital follow up visit please address:  1.  Resolution of Abdominal Pain.  2. Completion/Compliance with of 14 day course of Oral  Vancomycin  3. Patient to follow up with Cardiology for cardiac stress test for new LBBB, positive troponin's during admission.  In addition patient has a CHADS2VASc score of at least 4, was not started on A/C during this admission due to GI Bleed, this will need to be reevaluated at follow up.  4.  Patient to follow up with Gastroentrology- Dr Laural Golden, can consider repeat colonoscopy after discussion of risk benefit.  5. Home health with Physical Therapy, Home Health Aid, and RN was ordered.  6.  Labs / imaging needed at time of follow-up: BMP (recheck potassium, was persistently hypokalemic due to diarrhea during hospitalization given 10 day supplementation course)  7.  Pending labs/ test needing follow-up: None  Follow-up Appointments:     Follow-up Information   Follow up with [name], MD. Schedule an appointment as soon as possible for a visit in 3 days.   Specialty:  Internal Medicine   Contact information:   [place]  [address] [phone]       Follow up with Sanda Klein, MD On 07/17/2013. (at 9:30am)    Specialty:  Cardiology   Contact information:   [address] [place] [address] [phone]       Follow up with  REHMAN,NAJEEB U, MD. Schedule an appointment as soon as possible for a visit in 1 week.   Specialty:  Gastroenterology   Contact information:   [address], [address] [place] [address] [phone]       Discharge Instructions: Discharge Orders   Future Appointments Provider Department Dept Phone   07/17/2013 9:30 AM Sanda Klein, MD [facility] [phone]   Future Orders Complete By Expires   Call MD for:  difficulty breathing, headache or visual disturbances  As directed    Call MD for:  severe uncontrolled pain  As directed    Call MD for:  temperature >100.4  As directed    Diet - low sodium heart healthy  As directed    Discharge instructions  As directed    Comments:       Please start taking your new higher dose of Metoprolol 50mg  twice a day. You will need to take 2.21ml of Vancomycin 4 times a day for 13 more days (stop on March 4th) Please take the potassium supplement 19mEq daily for the next 10 days (unless your PCP tells you to stop) You can continue to take Bentyl 10mg  up to 4 times a day for abdominal cramping. - It is important to follow up with your PCP, Cardiology, and your Gastroenterologist.   Increase activity slowly  As directed       Consultations: Treatment Team:  Rounding L[facility]cardiology, MD Wonda Horner, MD  Procedures Performed:  Ct Abdomen Pelvis Wo Contrast  06/17/2013   CLINICAL DATA:  Abdominal pain, history of colitis  EXAM: CT ABDOMEN AND PELVIS WITHOUT CONTRAST  TECHNIQUE: Multidetector CT imaging of the abdomen and pelvis was performed following the standard protocol without intravenous contrast.  COMPARISON:  None.  FINDINGS: Cardiomegaly is noted. Mitral valve calcifications. There is patchy atelectasis or infiltrate in left lower lobe. Small hiatal hernia. Punctate calcifications within liver probable from prior granulomatous disease. The study is limited without IV contrast. Mild distended gallbladder. Tiny layering gallstones are noted within gallbladder. No pericholecystic fluid. Atherosclerotic calcifications of abdominal aorta and iliac arteries. Atherosclerotic calcifications bilateral renal artery origin and SMA origin. Unenhanced pancreas, spleen and adrenals are unremarkable. Bilateral kidneys shows a lobulated contour. Mild perinephric nonspecific stranding and fluid. Probable cyst in midpole anterior aspect of the left kidney measures 1.7 cm. No nephrolithiasis. No hydronephrosis or hydroureter. No calcified ureteral calculi. There is infraumbilical midline ventral hernia containing fat measures 3.7 cm.  Multiple colonic diverticula are noted. The terminal ileum is unremarkable. Normal appendix.  There is mild thickening of  sigmoid colon wall. Multiple sigmoid colon diverticula are noted. Axial image 63 there is mild stranding of pericolonic fat and trace pericolonic fluid in proximal sigmoid colon. Findings are highly suspicious for mild diverticulitis. No definite perforation is noted on this unenhanced scan. No diverticular abscess.  The uterus is atrophic. There is a nodular structure with a ring peripheral calcification in left adnexal region measures 2.3 cm. This may represent a calcified ovarian cyst a calcified adnexal fibroid or a dermoid. A distended urinary bladder is noted. No calcified calculi are noted within urinary bladder.  No small bowel obstruction.  No free abdominal air.  No adenopathy.  Sagittal images of the spine shows osteopenia and mild degenerative changes lumbar spine. Probable hemangioma T11 vertebral body.  IMPRESSION: 1. Patchy atelectasis or infiltrate in left lower lobe. 2. Multiple colonic diverticula. Multiple sigmoid colon diverticula. There is mild pericolonic stranding and small amount of pericolonic  fluid proximal sigmoid colon in left lower quadrant suspicious for mild diverticulitis. No diverticular abscess. 3. Distended urinary bladder without evidence of calcified calculi. 4. Infraumbilical midline ventral hernia containing fat measures about 3.7 cm. 5. Normal appendix.  No pericecal inflammation. 6. Atherosclerotic vascular calcifications. 7. Mild distended gallbladder.  Tiny layering gallstones. 8. Calcified lesion in left adnexa measures 2.3 cm. Please see above discussion.   Electronically Signed   By: Lahoma Crocker M.D.   On: 06/17/2013 10:33   Ct Abdomen Pelvis W Contrast  06/22/2013   CLINICAL DATA:  Right lower quadrant pain. Patient receiving therapy for diverticulitis.  EXAM: CT ABDOMEN AND PELVIS WITH CONTRAST  TECHNIQUE: Multidetector CT imaging of the abdomen and pelvis was performed using the standard protocol following bolus administration of intravenous contrast.  CONTRAST:   170mL OMNIPAQUE IOHEXOL 300 MG/ML  SOLN  COMPARISON:  CT CTA ABD/PEL W/CM AND/OR W/O CM dated 06/17/2013; CT ABD/PELV WO CM dated 06/17/2013  FINDINGS: There is interval increase in small bilateral pleural effusions. Mild basilar atelectasis. No evidence of pneumonia. No pericardial fluid. The heart is enlarged.  No focal hepatic lesion. There is fatty infiltration along the falciform ligament. Gallbladder, pancreas, spleen, adrenal glands, and kidneys are normal. Nonenhancing cyst in the left kidney.  Stomach, small bowel, and cecum are unchanged. There is a long segment of bowel wall thickening involving the distal ileum leading up to the terminal ileum (image 49) for example. The ascending colon transverse colon are normal. There are diverticula of the descending colon. Again demonstrated a circumferential bowel wall thickening and mucosal thickening over a long segment of sigmoid colon approximately 12 cm (image 67). This is similar prior. There is mild pericolonic inflammation approximately associated with this segment of bowel wall thickening. Contrast flows to entirety the colon the rectum which appears normal.  Bowel aorta is normal caliber. No retroperitoneal periportal lymphadenopathy.  Again demonstrated a soft tissue mass adjacent to the sigmoid colon measuring 2.5 x 1.5 cm not changed from prior. Medially adjacent there is a of peripherally calcified mass measuring 3.0 x 3.1 cm unchanged from prior.  No aggressive osseous lesion.  IMPRESSION: 1. Interval increase in bilateral pleural effusions. No evidence of pneumonia. 2. Persistent long segment of distal small bowel wall thickening leading up to terminal ileum. Favor terminal ileitis but cannot exclude inflammatory bowel disease. 3. Long segment of bowel wall thickening involving the proximal sigmoid colon with mild pericolonic thickening consistent with diverticulitis. No evidence of progression compared to prior. 4. Soft tissue and calcified lesions  in the left adnexal region are unchanged from prior   Electronically Signed   By: Suzy Bouchard M.D.   On: 06/22/2013 20:50   Dg Chest Port 1 View  06/19/2013   CLINICAL DATA:  Shortness of Breath  EXAM: PORTABLE CHEST - 1 VIEW  COMPARISON:  06/17/2013  FINDINGS: There is central vascular congestion and mild interstitial prominence bilaterally suspicious for mild interstitial edema. Bilateral basilar hazy atelectasis or infiltrate. Cardiomegaly again noted.  IMPRESSION: There is central vascular congestion and mild interstitial prominence bilaterally suspicious for mild interstitial edema. Bilateral basilar hazy atelectasis or infiltrate.   Electronically Signed   By: Lahoma Crocker M.D.   On: 06/19/2013 09:04   Dg Chest Portable 1 View  06/17/2013   CLINICAL DATA:  Abdominal pain, nausea and vomiting  EXAM: PORTABLE CHEST - 1 VIEW  COMPARISON:  07/25/2004  FINDINGS: Cardiomegaly is noted. Central vascular congestion without convincing pulmonary edema. Mild right basilar  atelectasis. Hazy left basilar atelectasis or infiltrate. Probable small left pleural effusion.  IMPRESSION: Central vascular congestion without convincing pulmonary edema. Mild right basilar atelectasis. Hazy left basilar atelectasis or infiltrate. Probable small left pleural effusion.   Electronically Signed   By: Lahoma Crocker M.D.   On: 06/17/2013 08:31   Dg Abd Portable 1v  06/17/2013   CLINICAL DATA:  Abdominal pain  EXAM: PORTABLE ABDOMEN - 1 VIEW  COMPARISON:  None.  FINDINGS: Single portable decubitus view of the abdomen reveals no definitive free air. No acute bony abnormality is noted. Degenerative changes of the lumbar spine are seen.   Electronically Signed   By: Inez Catalina M.D.   On: 06/17/2013 08:39   Ct Angio Abd/pel W/ And/or W/o  06/17/2013   CLINICAL DATA:  GI bleed of uncertain etiology, questionable bleeding ulcer  EXAM: CT ANGIOGRAPHY ABDOMEN AND PELVIS WITH CONTRAST  TECHNIQUE: Multidetector CT imaging of the abdomen  and pelvis was performed using the standard protocol during bolus administration of intravenous contrast. Multiplanar reconstructed images and MIPs were obtained and reviewed to evaluate the vascular anatomy.  CONTRAST:  138mL OMNIPAQUE IOHEXOL 350 MG/ML SOLN  COMPARISON:  CT abdomen pelvis-earlier same day  FINDINGS: Vascular Findings:  Abdominal aorta: Rather extensive amounts of mixed noncalcified though predominantly calcified atherosclerotic plaque within a normal caliber abdominal aorta. No abdominal aortic dissection or periaortic stranding.  Celiac artery: There is a mixture of calcified and noncalcified atherosclerotic plaque involving in the origin of the celiac artery, not resulting in hemodynamically significant stenosis. Conventional branching pattern.  SMA: There is a mixture of calcified and noncalcified atherosclerotic plaque involving the origin of the SMA, not result in hemodynamically significant stenosis. A replaced right hepatic artery is incidentally noted to arise from the proximal aspect of the SMA.  Right Renal artery: Solitary; there is a minimal amount of eccentric mixed calcified and noncalcified atherosclerotic plaque involving the cranial aspect of the origin of the right renal artery, not resulting in hemodynamically significant stenosis  Left Renal artery: Solitary; there is a mixture of calcified and noncalcified atherosclerotic plaque involving the cranial aspect of the origin and proximal aspect of the left renal artery, not result in hemodynamically significant stenosis  IMA: Widely patent.  Pelvic vasculature: There is mixed calcified and noncalcified atherosclerotic plaque involving the bilateral common and internal iliac artery is, not resulting in hemodynamically significant stenosis. The bilateral external iliac arteries are widely patent and of normal caliber.  Review of the MIP images confirms the above findings.    --------------------------------------------------------------------------------  Nonvascular Findings:  Normal hepatic contour. There is an approximately 1.0 cm hypo attenuating (10 Hounsfield unit) cyst within the lateral segment of the left lobe of the liver (image 31, series 4). A punctate granuloma is noted within in the medial segment of the left lobe of the liver (image 31), likely the sequela of prior granulomatous infection. Several layering radiopaque gallstones are seen within an otherwise normal-appearing gallbladder. No intra or extrahepatic biliary duct dilatation. No ascites.  There is symmetric enhancement and excretion of the bilateral kidneys. No definite renal stones on this postcontrast examination. Note is made of a partially exophytic approximately 2.2 cm hypo attenuating (4 Hounsfield unit) left-sided renal cyst (image 58, series 4). No discrete right-sided renal lesions. There is a minimal amount of grossly symmetric likely age-related perinephric stranding. No urinary obstruction. Normal appearance of the bilateral adrenal glands, pancreas and spleen.  Redemonstrated extensive colonic diverticulosis. There is mesenteric stranding adjacent to  the sigmoid colon within the left lower abdominal quadrant (representative axial images 89, 97, 98, series 4) worrisome for acute uncomplicated diverticulitis. No evidence of perforation or drainable fluid collection.  There is mild rather diffuse bowel wall thickening involving the distal small bowel (representative axial images 69, 97, 109, 118 and 121), not resulting in enteric obstruction. No pneumoperitoneum, pneumatosis or portal venous gas. Normal appearance of the appendix.  Scattered shoddy portahepatis and retroperitoneal lymph nodes individually not enlarged by size criteria. No retroperitoneal, mesenteric, pelvic or inguinal lymph adenopathy.  A Foley catheter is seen within the decompressed urinary bladder. Adjacent to the posterior aspect  of the sigmoid colon within the left hemipelvis is an approximately 2.5 x 1.5 cm nodular apparent soft tissue structure (image 116, series 4). This structure is immediately anterior to the previously identified approximately 2.9 cm peripherally calcified left adnexal structure (image 118, series 4). Otherwise, normal appearance of the pelvic organs. No free fluid within the pelvis.  Limited visualization of lower thorax rib demonstrates bilateral lower lung heterogeneous and consolidative opacities. No pleural effusion.  Cardiomegaly. Calcifications in the mitral valve annulus. No pericardial effusion.  There is mild-to-moderate diffuse trabecular thickening involving the right ilium suggestive of Paget's disease. A bone island is incidentally noted within the left ilium. An intraosseous hemangioma is incidentally noted within the T11 vertebral body. Mesenteric fat containing periumbilical hernia.  IMPRESSION: 1. Large amount of predominantly calcified atherosclerotic plaque within normal caliber abdominal aorta, not resulting in hemodynamically significant stenosis. No abdominal aortic dissection or evidence of mesenteric ischemia. 2. Findings again suggestive of acute uncomplicated diverticulitis involving the sigmoid colon within the left hemipelvis. No definite evidence of perforation or definable/drainable fluid collection. 3. Nonspecific mild though rather diffuse apparent bowel wall thickening involving the distal small bowel, not resulting in enteric obstruction - while possibly reactive secondary to ongoing acute diverticulitis, a secondary enteritis may have a similar appearance. Clinical correlation is advised. 4. Indeterminate approximately 2.5 cm nodular soft tissue structure adjacent to the posterior wall of the sigmoid colon within the left hemipelvis. This structure is indeterminate with differential considerations including an enlarged possibly reactive left pelvic sidewall lymph node, a focally  dilated left-sided gonadal vein or possibly an underdistended enlarged diverticulum. 5. Unchanged peripherally calcifying approximately 2.9 cm left-sided adnexal lesion nonspecific though possibly representative of a partially calcified ovarian dermoid. 6. Suspected Paget's disease involving the right ilium.   Electronically Signed   By: Sandi Mariscal M.D.   On: 06/17/2013 16:36    2D Echo: 06/18/13 Study Conclusions  - Left ventricle: The cavity size was normal. There was mild focal basal hypertrophy of the septum. Systolic function was mildly to moderately reduced. The estimated ejection fraction was in the range of 40% to 45% - some views look somewhat better. Probable severe hypokinesis of the mid-distal anteroseptal myocardium - abnormal septal motion with LBBB likely also contributes. Doppler parameters are consistent with abnormal left ventricular relaxation (grade 1 diastolic dysfunction). Doppler parameters are consistent with high ventricular filling pressure. - Ventricular septum: Septal motion showed abnormal function and dyssynergy. - Aortic valve: Trileaflet; mildly calcified leaflets. - Mitral valve: Calcified annulus. Mildly thickened leaflets . Mild regurgitation directed eccentrically. - Left atrium: The atrium was moderately dilated. - Right atrium: The atrium was moderately dilated. Central venous pressure: 34mm Hg (est). - Tricuspid valve: Moderate regurgitation. - Pulmonary arteries: PA peak pressure: 68mm Hg (S). - Pericardium, extracardiac: A prominent pericardial fat pad was present. Impressions:  - Mild basal  septal hypertrophy with LVEF 40-45% (looks better in some views), septal dyssynergy with probable anteroseptal hypokinesis (LBBB likely contributes), grade 1 diastolic dysfunction. Moderate biatrial enlargement. MAC with mild mitral regurgitation. Moderate tricuspid regurgitation with PASP 49 mmHg and elevated CVP. Transthoracic echocardiography.  M-mode, complete 2D, spectral Doppler, and color Doppler. Height: Height: 170.2cm. Height: 67in. Weight: Weight: 76kg. Weight: 167.2lb. Body mass index: BMI: 26.2kg/m^2. Body surface area: BSA: 1.30m^2. Blood pressure: 121/55. Patient status: Inpatient. Location: ICU/CCU  Admission HPI: Ms. Westenhaver is a 80yo woman with history of HTN & hypothyroidism (s/p thyroidectomy d/t thyroid Ca) who presents with 2 days of worsening upper abdominal pain described as 10/10, sore and crampy. Not similar to anything she has experienced in the past, but was hospitalized in 2012 with diverticulitis. Pain was severe to the point of passing out last night. She had an episode of emesis at 1 am (unclear if bloody) and husband reports another episode since arrival at the hospital. She reports non bloody diarrhea, but unable to quantify number of episodes or when it started. [name]. Denies fever/chills. Denies chest pain, SOB, palpitations. Reports lightheadedness/dizziness. Denies NSAID use. She also reports she is not taking a PPI.  In the ED, she was treated with 2L NS boluses, a GI cocktail, a total of 12mg  of IV morphine, and 4mg  IV zofran.   Hospital Course by problem list:    Diverticulitis of colon Patient initially presented with severe abdominal pain out of proportion to exam.  A CT of the abdomen and pelvis in the ED suggested mild uncomplicated diverticulitis of the sigmoid colon.  However given her degree of abdominal pain a CTA was obtained that showed no evidence of bowel ischemia and her lactic acid was trended.  Surgery was consulted from the ED who also monitored the patient.  Initially she was started on IV flagyl and Cipro. On the first night of her hospitalization this was broadened to Vanc and Zosyn due to concern for clinical deterioration.  Antibiotics were changed again on hospital day 1 to IV Cefepime and Flagyl for empiric coverage.  Patient's diet was  advanced very slowly as tolerated.  She however made slow clinical progress and on hospital day 5 her abdomen was re imaged which again showed uncomplicated diverticulitis.  At the request of her family gastroenterology was consulted.  After her testing for C diff returned positive gastroenterology signed off and recommended she follow up with her regular gastroenterologist as an outpatient.        Clostridium difficile enteritis  Patient's GI pain failed to improve dramatically after bowel rest and appropriate antibiotics for diverticulitis.  Given her diarrhea initially C diff by PCR was checked, however it was negative.  Her abdominal pain continued to worsen and on hospital day 5 patient developed severe abdominal pain again.  A repeat CTA of her abdomen and pelvis was obtained which showed Sigmoid diverticulitis as well as terminal ileitis.  A GI pathogen panel was obtained that was positive for C diff A/B toxin.  Cipro and flagyl were discontinued as well as protonix and her was started on PO Vancomycin to complete a 14 day course. SNF placement was recommended however patient refused placement.  She was eventually discharged home with [facility] PT, RN, and home health aid.      Elevated troponin with New left bundle branch block Patient presented to the ED without chest pain.  Initial EKG showed a new LBBB that  was not present on EKG in 2012.  Cardiology was consulted in the ED and due to her history of GI bleeding as well as severe abdominal pain she was not given antiplatelet or heparin therapy.  Cardiac enzymes were trended and rose to 0.8 before trending down.  Her troponin elevation was deemed to be secondary to demand ischemia. She was managed conservatively with beta blockers as described below.  Cardiology recommended an outpatient stress test to further assess cardiac risk after stabilization of her GI co morbidities.    Atrial fibrillation On hospital day 2 patient developed A fib with RVR while  being monitored on telemetry.  This was confirmed with an EKG.  Her home Metoprolol restarted was incresed to 12.5 BID for rate control. As patient was too unstable for sedation, chemical cardioversion was attempted with Amiodarone drip but was unsuccessful and discontinued after 48 hours of persistent Afib.  Her metoprolol was gradually titrated up to 50mg  BID for rate control.  She was not deemed to be a candiate for anticoagulation given her history of GIB during previous hospitalization as well as her GIB during this hospitalization.  Cardiology assisted in her management and she has a follow up appointment scheduled to further discuss anticoagulation and managment     Hypokalemia and Hypomagnesemia To do poor PO intake in setting of diarrhea.  Was monitored and replaced as needed.  Patient discharged with 10 day course of PO potassium supplementation.  Hypertension Her antihypertensives were initially held due to concern for GIB.  Gradually her home antihypertensive medications were reintroduced as needed.  Discharge Vitals:   BP 149/75  Pulse 69  Temp(Src) 98 F (36.7 C) (Oral)  Resp 16  Ht 5\' 7"  (1.702 m)  Wt 157 lb 6.5 oz (71.4 kg)  BMI 24.65 kg/m2  SpO2 100%  Discharge Labs:  Results for orders placed during the hospital encounter of 06/17/13 (from the past 24 hour(s))  BASIC METABOLIC PANEL     Status: Abnormal   Collection Time    06/26/13  3:12 AM      Result Value Ref Range   Sodium 138  137 - 147 mEq/L   Potassium 3.2 (*) 3.7 - 5.3 mEq/L   Chloride 100  96 - 112 mEq/L   CO2 25  19 - 32 mEq/L   Glucose, Bld 94  70 - 99 mg/dL   BUN 3 (*) 6 - 23 mg/dL   Creatinine, Ser 0.60  0.50 - 1.10 mg/dL   Calcium 7.3 (*) 8.4 - 10.5 mg/dL   GFR calc non Af Amer 84 (*) >90 mL/min   GFR calc Af Amer >90  >90 mL/min    Signed: Joni Reining, DO 06/26/2013, 11:25 AM   Time Spent on Discharge: 50 minutes Services Ordered on Discharge: [facility] [place] [facility], RN, Health Aid Equipment Ordered on  Discharge: [employer], Bedside commode

## 2013-06-26 NOTE — Discharge Instructions (Addendum)
Please remember to call and make it to your follow up appointments.  Dr. Victorino December office will call you with date and time of your stress test.  You will have this done before you see him back.    Home Health Care to be provided by [facility] Phone # [phone]

## 2013-06-26 NOTE — Progress Notes (Signed)
Occupational Therapy Treatment Patient Details Name: KARINNE SCHMADER MRN: [id number] DOB: 04/28/1931 Today's Date: 06/26/2013 Time: 5277-8242 OT Time Calculation (min): 20 min  OT Assessment / Plan / Recommendation  History of present illness Ms. Pandolfi is a 80yo woman with history of HTN & hypothyroidism (s/p thyroidectomy d/t thyroid Ca) who presents with 2 days of worsening upper abdominal pain described as 10/10, sore and crampy.  Not similar to anything she has experienced in the past, but was hospitalized in 2012 with diverticulitis. Pain was severe to the point of passing out last night.  She had an episode of emesis at 1 am (unclear if bloody) and husband reports another episode since arrival at the hospital. She reports non bloody diarrhea, but unable to quantify number of episodes or when it started.  [name]. Denies fever/chills. Denies chest pain, SOB, palpitations. Reports lightheadedness/dizziness. Denies NSAID use.  She also reports she is not taking a PPI.   OT comments  Pt with diarrhea requiring assist for clean up including floor. Continues to demonstrate decreased activity tolerance, particularly during standing activities at sink.  Follow Up Recommendations  SNF;Supervision/Assistance - 24 hour    Barriers to Discharge       Equipment Recommendations       Recommendations for Other Services    Frequency Min 2X/week   Progress towards OT Goals Progress towards OT goals: Progressing toward goals  Plan Discharge plan remains appropriate    Precautions / Restrictions Precautions Precautions: Fall Restrictions Weight Bearing Restrictions: No   Pertinent Vitals/Pain VSS, pain in bottom with wiping, applied barrier cream    ADL  Grooming: Wash/dry face;Wash/dry hands;Supervision/safety Where Assessed - Grooming: Unsupported standing Upper Body Dressing: Set up Where Assessed - Upper Body Dressing: Unsupported  sitting Toilet Transfer: Supervision/safety Toilet Transfer Method: Sit to Loss adjuster, chartered: Regular height toilet;Grab bars Toileting - Clothing Manipulation and Hygiene: Minimal assistance (for thoroughness after BM) Where Assessed - Toileting Clothing Manipulation and Hygiene: Standing Equipment Used: Gait belt Transfers/Ambulation Related to ADLs: min guard from bed <>bathroom ADL Comments: Pt with weakness, tends to lean on sink for support.    OT Diagnosis:    OT Problem List:   OT Treatment Interventions:     OT Goals(current goals can now be found in the care plan section) Acute Rehab OT Goals Time For Goal Achievement: 06/27/13  Visit Information  Last OT Received On: 06/26/13 Assistance Needed: +1 History of Present Illness: Ms. Prom is a 80yo woman with history of HTN & hypothyroidism (s/p thyroidectomy d/t thyroid Ca) who presents with 2 days of worsening upper abdominal pain described as 10/10, sore and crampy.  Not similar to anything she has experienced in the past, but was hospitalized in 2012 with diverticulitis. Pain was severe to the point of passing out last night.  She had an episode of emesis at 1 am (unclear if bloody) and husband reports another episode since arrival at the hospital. She reports non bloody diarrhea, but unable to quantify number of episodes or when it started.  [name]. Denies fever/chills. Denies chest pain, SOB, palpitations. Reports lightheadedness/dizziness. Denies NSAID use.  She also reports she is not taking a PPI.    Subjective Data      Prior Functioning       Cognition  Cognition Arousal/Alertness: Awake/alert Behavior During Therapy: WFL for tasks assessed/performed Overall Cognitive Status: Within Functional Limits  for tasks assessed    Mobility  Bed Mobility Overal bed mobility: Modified Independent Bed Mobility: Supine to Sit Sit to supine: Modified independent  (Device/Increase time) Transfers Overall transfer level: Needs assistance Transfers: Sit to/from Stand Sit to Stand: Supervision    Exercises      Balance Balance Standing balance-Leahy Scale: Fair  End of Session OT - End of Session Activity Tolerance: Patient limited by fatigue Patient left: in bed;with call bell/phone within reach;with family/visitor present  GO     Malka So 06/26/2013, 9:56 AM [phone]

## 2013-06-26 NOTE — Progress Notes (Signed)
Internal Medicine Teaching Service Daily Progress Note  Subjective: Able to tolerate regular diet, still not eating a full plate.  Worked with OT today, can transfer to bedside toilet but does need assistance with things like cleaning up after self.  She is adamant that she does not want to go to a SNF or rehab. She wants to go home with husband.  I have called the husband and he reports he thinks she has make a lot of improvement and thinks he can take care of her at home. She reports she is still having some diarrhea  Objective: Vital signs in last 24 hours: Filed Vitals:   06/25/13 1500 06/25/13 2136 06/26/13 0500 06/26/13 0521  BP: 136/70 138/72  149/75  Pulse: 68 74  69  Temp: 98.7 F (37.1 C) 98.6 F (37 C)  98 F (36.7 C)  TempSrc: Oral Oral  Oral  Resp: 18 18  16   Height:      Weight:   157 lb 6.5 oz (71.4 kg)   SpO2: 97% 97%  100%   Weight change: -3 lb 15.5 oz (-1.8 kg)  Intake/Output Summary (Last 24 hours) at 06/26/13 1050 Last data filed at 06/26/13 0900  Gross per 24 hour  Intake    360 ml  Output   1153 ml  Net   -793 ml   General: resting in bed NAD HEENT: EOMI Cardiac:  Irregular, 2/6 systolic murmur Pulm: CTA b/l Abd:  Mild RLQ tenderness, no rebound, normoactive bowel sounds, mild distension Ext: no pedal edema Neuro: AAOx4  Lab Results: Basic Metabolic Panel:  Recent Labs Lab 06/24/13 0415  06/25/13 0419 06/26/13 0312  NA 135*  < > 135* 138  K 2.8*  < > 3.1* 3.2*  CL 95*  < > 100 100  CO2 23  < > 24 25  GLUCOSE 95  < > 92 94  BUN 7  < > 5* 3*  CREATININE 0.65  < > 0.60 0.60  CALCIUM 7.4*  < > 7.1* 7.3*  MG 1.4*  --  1.8  --   < > = values in this interval not displayed. Liver Function Tests: No results found for this basename: AST, ALT, ALKPHOS, BILITOT, PROT, ALBUMIN,  in the last 168 hours CBC:  Recent Labs Lab 06/22/13 0638 06/23/13 0430  WBC 9.8 9.7  HGB 12.5 12.6  HCT 36.7 36.8  MCV 85.5 85.4  PLT 321 376   Cardiac  Enzymes:  Recent Labs Lab 06/19/13 2205 06/20/13 0407 06/20/13 0926  TROPONINI 0.31* <0.30 <0.30    Micro Results: Recent Results (from the past 240 hour(s))  CULTURE, BLOOD (ROUTINE X 2)     Status: None   Collection Time    06/17/13  4:45 PM      Result Value Ref Range Status   Specimen Description BLOOD RIGHT HAND   Final   Special Requests BOTTLES DRAWN AEROBIC AND ANAEROBIC 10CC   Final   Culture  Setup Time     Final   Value: 06/17/2013 22:11     Performed at [facility]   Culture     Final   Value: NO GROWTH 5 DAYS     Performed at [facility]   Report Status 06/23/2013 FINAL   Final  CULTURE, BLOOD (ROUTINE X 2)     Status: None   Collection Time    06/17/13  4:54 PM      Result Value Ref Range Status   Specimen  Description BLOOD LEFT HAND   Final   Special Requests BOTTLES DRAWN AEROBIC ONLY 5CC   Final   Culture  Setup Time     Final   Value: 06/17/2013 22:11     Performed at [facility]   Culture     Final   Value: NO GROWTH 5 DAYS     Performed at [facility]   Report Status 06/23/2013 FINAL   Final  MRSA PCR SCREENING     Status: None   Collection Time    06/17/13  6:53 PM      Result Value Ref Range Status   MRSA by PCR NEGATIVE  NEGATIVE Final   Comment:            The GeneXpert MRSA Assay (FDA     approved for NASAL specimens     only), is one component of a     comprehensive MRSA colonization     surveillance program. It is not     intended to diagnose MRSA     infection nor to guide or     monitor treatment for     MRSA infections.  CLOSTRIDIUM DIFFICILE BY PCR     Status: None   Collection Time    06/20/13  5:15 PM      Result Value Ref Range Status   C difficile by pcr NEGATIVE  NEGATIVE Final   Studies/Results: No results found. Medications: I have reviewed the patient's current medications. Scheduled Meds: . amLODipine  5 mg Oral Daily  . dicyclomine  10 mg Oral TID AC & HS  . feeding supplement  (RESOURCE BREEZE)  1 Container Oral TID BM  . levothyroxine  100 mcg Oral QAC breakfast  . metoprolol tartrate  50 mg Oral BID  . sodium chloride  3 mL Intravenous Q12H  . vancomycin  125 mg Oral QID   Continuous Infusions:   PRN Meds:.acetaminophen, diazepam, HYDROcodone-acetaminophen, magic mouthwash, phenol, promethazine, white petrolatum Assessment/Plan:    Diverticulitis of colon- with concominate  C diff C diff PCR neg on 06/20/13 (more sensitive and specific), C diff Toxin A/B positive 06/23/13  - Regular diet - Bentyl for cramping - D/C Cipro Flagyl   New onset Atrial Fibrillation - Rate control: Previously controlled with Metoprolol 50BID - Anticoagulation Not a candidate due to GIB  - CHA2DS2-VASc score = 4 - Patient will need to readdress A/C after colonoscopy    Troponin elevation with new LBBB - Cardiology to follow peripherally plan for lexiscan when stable, (can be done as outpatient) - Not candidate for ASA or A/C due to acute GIB - Patient on Metoprolol - Follow up with cardiology as outpatinet  C diff colitis -C diff PCR negative but C diff toxin A/B positive 2 days later - Oral Vanc for 14 days total.    Hypertension- mildly hypertensive this am - Antihypertensives held given Acute GIB, hypotension. - Metoprolol 50mg  BID for Afib. -restart amlodipine 5mg      Thyroid disease  synthroid 145mcg PO home dose - will change to IV.    Hypokalemia- improving - K+ 3.2 this AM - Likely secondary to diarrhea, poor PO intake - Ordered 63mEq this AM - Will give potassium supplement on discharge    Hypomagnesemia resolved   Diet: Regular VTE PPx: SCDs Code status: Full Dispo: Patient   The patient does have a current PCP (Richard Marcia Brash, MD) and does not need an [facility] hospital follow-up appointment after discharge.  The  patient does not have transportation limitations that hinder transportation to clinic appointments.  .Services Needed at time of  discharge: Y = Yes, Blank = No PT: y  OT:   RN: y  Equipment:   Other:     LOS: 9 days   Joni Reining, DO 06/26/2013, 10:50 AM

## 2013-06-26 NOTE — Progress Notes (Signed)
Per CM, patient is going home with home health. CSW is signing off at this time. Please re consult if social work needs arise.  Jeanette Caprice, MSW, [place]

## 2013-06-27 ENCOUNTER — Telehealth: Payer: Self-pay | Admitting: *Deleted

## 2013-06-27 MED ORDER — PROMETHAZINE HCL 12.5 MG PO TABS: 6.2500 mg | ORAL_TABLET | ORAL | Status: DC | PRN

## 2013-06-27 NOTE — Telephone Encounter (Signed)
Called and spoke with Evelena Peat, reports patient has some nausea and vomiting, is requesting low dose phenergan she received in the hospital.  She is not having any chest pain and is able to keep some foods down. Her diarrhea is improving.  I have ordered her Phenergan 6.25mg  Q4PRN and instructed her to follow up with her PCP on Monday. I have also given return precautions to come to the ED if diarrhea worsens, vomiting worsens, she develops shortness of breath or chest pain.

## 2013-06-27 NOTE — Telephone Encounter (Signed)
Call from [place], friend of pt  She states pt is vomiting quite a bit and is asking for med for this. The PCP's office  for pt is closed. Pt d/c from [facility] yesterday, s/p diverticulosis  Pt # [phone]

## 2013-06-30 ENCOUNTER — Encounter (HOSPITAL_COMMUNITY): Payer: Self-pay | Admitting: Emergency Medicine

## 2013-06-30 ENCOUNTER — Inpatient Hospital Stay (HOSPITAL_COMMUNITY)
Admission: EM | Admit: 2013-06-30 | Discharge: 2013-07-04 | DRG: 372 | Disposition: A | Discharge status: Home Health | Payer: Medicare Other | Attending: Family Medicine | Admitting: Family Medicine

## 2013-06-30 DIAGNOSIS — H3530 Unspecified macular degeneration: Secondary | ICD-10-CM | POA: Diagnosis present

## 2013-06-30 DIAGNOSIS — Z79899 Other long term (current) drug therapy: Secondary | ICD-10-CM

## 2013-06-30 DIAGNOSIS — E860 Dehydration: Secondary | ICD-10-CM | POA: Diagnosis present

## 2013-06-30 DIAGNOSIS — I10 Essential (primary) hypertension: Secondary | ICD-10-CM | POA: Diagnosis present

## 2013-06-30 DIAGNOSIS — R5383 Other fatigue: Secondary | ICD-10-CM

## 2013-06-30 DIAGNOSIS — E876 Hypokalemia: Secondary | ICD-10-CM | POA: Diagnosis present

## 2013-06-30 DIAGNOSIS — E43 Unspecified severe protein-calorie malnutrition: Secondary | ICD-10-CM | POA: Insufficient documentation

## 2013-06-30 DIAGNOSIS — E0789 Other specified disorders of thyroid: Secondary | ICD-10-CM | POA: Diagnosis present

## 2013-06-30 DIAGNOSIS — K429 Umbilical hernia without obstruction or gangrene: Secondary | ICD-10-CM | POA: Diagnosis present

## 2013-06-30 DIAGNOSIS — A0472 Enterocolitis due to Clostridium difficile, not specified as recurrent: Secondary | ICD-10-CM | POA: Diagnosis present

## 2013-06-30 DIAGNOSIS — E440 Moderate protein-calorie malnutrition: Secondary | ICD-10-CM | POA: Diagnosis present

## 2013-06-30 DIAGNOSIS — I4891 Unspecified atrial fibrillation: Secondary | ICD-10-CM | POA: Diagnosis present

## 2013-06-30 DIAGNOSIS — Z85850 Personal history of malignant neoplasm of thyroid: Secondary | ICD-10-CM

## 2013-06-30 DIAGNOSIS — R531 Weakness: Secondary | ICD-10-CM | POA: Diagnosis present

## 2013-06-30 DIAGNOSIS — R197 Diarrhea, unspecified: Secondary | ICD-10-CM | POA: Diagnosis present

## 2013-06-30 DIAGNOSIS — R5381 Other malaise: Secondary | ICD-10-CM

## 2013-06-30 DIAGNOSIS — IMO0002 Reserved for concepts with insufficient information to code with codable children: Secondary | ICD-10-CM

## 2013-06-30 DIAGNOSIS — R627 Adult failure to thrive: Secondary | ICD-10-CM | POA: Diagnosis present

## 2013-06-30 DIAGNOSIS — M1990 Unspecified osteoarthritis, unspecified site: Secondary | ICD-10-CM | POA: Diagnosis present

## 2013-06-30 DIAGNOSIS — E079 Disorder of thyroid, unspecified: Secondary | ICD-10-CM

## 2013-06-30 HISTORY — DX: Unspecified atrial fibrillation: I48.91

## 2013-06-30 HISTORY — DX: Diverticulitis of intestine, part unspecified, without perforation or abscess without bleeding: K57.92

## 2013-06-30 HISTORY — DX: Enterocolitis due to Clostridium difficile, not specified as recurrent: A04.72

## 2013-06-30 LAB — CBC WITH DIFFERENTIAL/PLATELET
Basophils Absolute: 0.1 10*3/uL (ref 0.0–0.1)
Basophils Relative: 1 % (ref 0–1)
EOS ABS: 0.2 10*3/uL (ref 0.0–0.7)
Eosinophils Relative: 2 % (ref 0–5)
HEMATOCRIT: 39.2 % (ref 36.0–46.0)
HEMOGLOBIN: 13.2 g/dL (ref 12.0–15.0)
Lymphocytes Relative: 13 % (ref 12–46)
Lymphs Abs: 1.1 10*3/uL (ref 0.7–4.0)
MCH: 29.7 pg (ref 26.0–34.0)
MCHC: 33.7 g/dL (ref 30.0–36.0)
MCV: 88.3 fL (ref 78.0–100.0)
MONO ABS: 1.3 10*3/uL — AB (ref 0.1–1.0)
MONOS PCT: 15 % — AB (ref 3–12)
NEUTROS PCT: 69 % (ref 43–77)
Neutro Abs: 5.9 10*3/uL (ref 1.7–7.7)
Platelets: 443 10*3/uL — ABNORMAL HIGH (ref 150–400)
RBC: 4.44 MIL/uL (ref 3.87–5.11)
RDW: 16.1 % — ABNORMAL HIGH (ref 11.5–15.5)
WBC: 8.5 10*3/uL (ref 4.0–10.5)

## 2013-06-30 LAB — I-STAT CG4 LACTIC ACID, ED: Lactic Acid, Venous: 1.66 mmol/L (ref 0.5–2.2)

## 2013-06-30 LAB — BASIC METABOLIC PANEL
BUN: 5 mg/dL — ABNORMAL LOW (ref 6–23)
CHLORIDE: 99 meq/L (ref 96–112)
CO2: 28 mEq/L (ref 19–32)
Calcium: 8 mg/dL — ABNORMAL LOW (ref 8.4–10.5)
Creatinine, Ser: 0.66 mg/dL (ref 0.50–1.10)
GFR calc Af Amer: >90 mL/min (ref >90)
GFR calc non Af Amer: 81 mL/min — ABNORMAL LOW (ref >90)
Glucose, Bld: 93 mg/dL (ref 70–99)
Potassium: 3.7 mEq/L (ref 3.7–5.3)
SODIUM: 138 meq/L (ref 137–147)

## 2013-06-30 MED ORDER — SODIUM CHLORIDE 0.9 % IJ SOLN
3.0000 mL | Freq: Two times a day (BID) | INTRAMUSCULAR | Status: DC
  Administered 2013-07-01 – 2013-07-04 (×4): 3 mL via INTRAVENOUS

## 2013-06-30 MED ORDER — PROMETHAZINE HCL 12.5 MG PO TABS
6.2500 mg | ORAL_TABLET | ORAL | Status: DC | PRN
  Administered 2013-07-01 – 2013-07-04 (×5): 6.25 mg via ORAL
  Filled 2013-06-30 (×5): qty 1

## 2013-06-30 MED ORDER — DIAZEPAM 5 MG PO TABS
2.5000 mg | ORAL_TABLET | Freq: Two times a day (BID) | ORAL | Status: DC | PRN
  Administered 2013-07-01 – 2013-07-03 (×3): 2.5 mg via ORAL
  Filled 2013-06-30 (×4): qty 1

## 2013-06-30 MED ORDER — VANCOMYCIN 50 MG/ML ORAL SOLUTION
125.0000 mg | Freq: Four times a day (QID) | ORAL | Status: DC
  Administered 2013-07-01 (×2): 125 mg via ORAL
  Filled 2013-06-30 (×7): qty 2.5

## 2013-06-30 MED ORDER — LEVOTHYROXINE SODIUM 100 MCG PO TABS: 100.0000 ug | ORAL_TABLET | Freq: Every day | ORAL | Status: DC

## 2013-06-30 MED ORDER — SODIUM CHLORIDE 0.9 % IV SOLN: 250.0000 mL | INTRAVENOUS | Status: DC | PRN

## 2013-06-30 MED ORDER — METOPROLOL TARTRATE 50 MG PO TABS
50.0000 mg | ORAL_TABLET | Freq: Two times a day (BID) | ORAL | Status: DC
  Administered 2013-07-01 – 2013-07-04 (×8): 50 mg via ORAL
  Filled 2013-06-30 (×8): qty 1

## 2013-06-30 MED ORDER — DICYCLOMINE HCL 10 MG PO CAPS
10.0000 mg | ORAL_CAPSULE | Freq: Three times a day (TID) | ORAL | Status: DC
  Administered 2013-07-01 – 2013-07-02 (×8): 10 mg via ORAL
  Filled 2013-06-30 (×8): qty 1

## 2013-06-30 MED ORDER — POTASSIUM CHLORIDE CRYS ER 10 MEQ PO TBCR
20.0000 meq | EXTENDED_RELEASE_TABLET | Freq: Every day | ORAL | Status: DC
  Administered 2013-07-01 – 2013-07-04 (×3): 20 meq via ORAL
  Filled 2013-06-30 (×5): qty 2

## 2013-06-30 MED ORDER — LEVOTHYROXINE SODIUM 100 MCG PO TABS
100.0000 ug | ORAL_TABLET | ORAL | Status: DC
  Administered 2013-07-01 – 2013-07-04 (×3): 100 ug via ORAL
  Filled 2013-06-30 (×3): qty 1

## 2013-06-30 MED ORDER — SODIUM CHLORIDE 0.9 % IJ SOLN: 3.0000 mL | INTRAMUSCULAR | Status: DC | PRN

## 2013-06-30 MED ORDER — SODIUM CHLORIDE 0.9 % IV BOLUS (SEPSIS)
500.0000 mL | Freq: Once | INTRAVENOUS | Status: AC
  Administered 2013-06-30: 500 mL via INTRAVENOUS

## 2013-06-30 MED ORDER — ONDANSETRON HCL 4 MG/2ML IJ SOLN
4.0000 mg | Freq: Once | INTRAMUSCULAR | Status: AC
  Administered 2013-06-30: 4 mg via INTRAVENOUS
  Filled 2013-06-30: qty 2

## 2013-06-30 MED ORDER — HYDROCODONE-ACETAMINOPHEN 5-325 MG PO TABS
1.0000 | ORAL_TABLET | Freq: Four times a day (QID) | ORAL | Status: DC | PRN
  Administered 2013-07-01 (×2): 1 via ORAL
  Filled 2013-06-30 (×2): qty 1

## 2013-06-30 NOTE — ED Notes (Signed)
Pt placed on monitor.  

## 2013-06-30 NOTE — ED Notes (Signed)
States she was just discharged from [facility] on Fri after being seen for abdominal pain with dx diverticulitis. States since she was discharged, she hasn't been able to eat or drink without her stomach cramping and then has nausea, vomiting,and diarrhea. Pt has c-diff.

## 2013-06-30 NOTE — ED Provider Notes (Signed)
CSN: [id number]     Arrival date & time 06/30/13  1713 History   First MD Initiated Contact with Patient 06/30/13 1805     Chief Complaint  Patient presents with  . Nausea  . Emesis  . Diarrhea     Patient is a 80 y.o. female presenting with diarrhea. The history is provided by the patient and a relative.  Diarrhea Quality:  Watery Severity:  Moderate Onset quality:  Gradual Number of episodes:  Several Duration:  14 days Timing:  Intermittent Progression:  Worsening Relieved by:  Nothing Worsened by:  Nothing tried Associated symptoms: abdominal pain   Risk factors comment:  Recent history of cdif PT presents with worsening diarrhea and abdominal pain with eating No active vomiting She reports she is unable to tolerate any PO liquids She was recently in hospital and diagnosed with cdif colitis and is taking vancomycin She reports she is not improving  Past Medical History  Diagnosis Date  . Osteoarthritis   . Macular degeneration   . Hypertension   . Thyroid disease   . Thyroid cancer   . GI bleed   . Atrial fibrillation   . C. difficile colitis   . Diverticulitis    Past Surgical History  Procedure Laterality Date  . Thyroidectomy    . Colonoscopy  03/17/2011    Procedure: COLONOSCOPY;  Surgeon: Rogene Houston, MD;  Location: [facility];  Service: Endoscopy;  Laterality: N/A;   History reviewed. No pertinent family history. History  Substance Use Topics  . Smoking status: Never Smoker   . Smokeless tobacco: Not on file  . Alcohol Use: No   OB History   Grav Para Term Preterm Abortions TAB SAB Ect Mult Living                 Review of Systems  Constitutional: Positive for fatigue.  Respiratory: Negative for shortness of breath.   Cardiovascular: Negative for chest pain.  Gastrointestinal: Positive for abdominal pain and diarrhea. Negative for blood in stool.  Neurological: Positive for weakness.  All other systems reviewed and are  negative.      Allergies  Thyroid hormones  Home Medications   Current Outpatient Rx  Name  Route  Sig  Dispense  Refill  . diazepam (VALIUM) 5 MG tablet   Oral   Take 2.5 mg by mouth 2 (two) times daily as needed for anxiety.         . dicyclomine (BENTYL) 10 MG capsule   Oral   Take 1 capsule (10 mg total) by mouth 4 (four) times daily -  before meals and at bedtime.   60 capsule   0   . HYDROcodone-acetaminophen (NORCO/VICODIN) 5-325 MG per tablet   Oral   Take 1 tablet by mouth every 6 (six) hours as needed for moderate pain.         Marland Kitchen levothyroxine (SYNTHROID, LEVOTHROID) 100 MCG tablet   Oral   Take 100 mcg by mouth daily. Do not take synthroid on Mon and Thursdays         . metoprolol (LOPRESSOR) 50 MG tablet   Oral   Take 1 tablet (50 mg total) by mouth 2 (two) times daily.   60 tablet   0   . potassium chloride 20 MEQ TBCR   Oral   Take 20 mEq by mouth daily.   10 tablet   0   . Probiotic Product (ALIGN PO)   Oral   Take 1  tablet by mouth daily.         . promethazine (PHENERGAN) 12.5 MG tablet   Oral   Take 0.5 tablets (6.25 mg total) by mouth every 4 (four) hours as needed for nausea or vomiting.   30 tablet   0   . vancomycin (VANCOCIN) 50 mg/mL oral solution   Oral   Take 2.5 mLs (125 mg total) by mouth 4 (four) times daily.   130 mL   0    BP 147/72  Pulse 71  Temp(Src) 98.7 F (37.1 C) (Oral)  Resp 20  Ht 5\' 7"  (1.702 m)  Wt 158 lb (71.668 kg)  BMI 24.74 kg/m2  SpO2 96% Physical Exam CONSTITUTIONAL: Well developed/well nourished HEAD: Normocephalic/atraumatic EYES: EOMI/PERRL, no icterus ENMT: Mucous membranes dry NECK: supple no meningeal signs SPINE:entire spine nontender CV: S1/S2 noted, no murmurs/rubs/gallops noted LUNGS: Lungs are clear to auscultation bilaterally, no apparent distress ABDOMEN: soft, nontender, no rebound or guarding Rectal - stool color brown, hemoccult negative GU:no cva tenderness NEURO:  Pt is awake/alert, moves all extremitiesx4 EXTREMITIES: pulses normal, full ROM SKIN: warm, color normal PSYCH: no abnormalities of mood noted  ED Course  Procedures   8:15 PM Pt with recent admission for abd pain/diarrhea and found to have cdif She is currently stable Labs reassuring thus far  8:55 PM Pt feels she is not improving, she has no appetite and feels she will worsen at home Will admit for observation and she will undergo consideration for short term rehab while in hospital and she is willing to do this D/w dr Shanon Brow, will admit to hospital  Labs Review Labs Reviewed  BASIC METABOLIC PANEL - Abnormal; Notable for the following:    BUN 5 (*)    Calcium 8.0 (*)    GFR calc non Af Amer 81 (*)    All other components within normal limits  CBC WITH DIFFERENTIAL - Abnormal; Notable for the following:    RDW 16.1 (*)    Platelets 443 (*)    Monocytes Relative 15 (*)    Monocytes Absolute 1.3 (*)    All other components within normal limits  I-STAT CG4 LACTIC ACID, ED     Date: 06/30/2013  Rate: 71  Rhythm: normal sinus rhythm  QRS Axis: left  Intervals: normal  ST/T Wave abnormalities: nonspecific ST changes  Conduction Disutrbances:left bundle branch block  Narrative Interpretation:   Old EKG Reviewed: unchanged    MDM   Final diagnoses:  Diarrhea  Dehydration    Nursing notes including past medical history and social history reviewed and considered in documentation Labs/vital reviewed and considered Previous records reviewed and considered     Sharyon Cable, MD 06/30/13 2057

## 2013-06-30 NOTE — H&P (Signed)
PCP:   [name], MD   Chief Complaint:  weak  HPI: 80 yo female recent d/c from [facility] for cdiff colitis comes in for persistent diarrhea about 6-7 times a day.  No fevers.  Still having some cramping that is relieved after she poops.  No n/v.  Says she has not eaten or drink anything in at least 20 days.  Labs are normal and dont reflect this.  No swelling or edema.  Able to take her vanco orally and is compliant with that.  Still feeling weak and not back to normal and not able to take care of her disabled husband at home.  Asked to admit pt as she refused to go home, is considering short term rehab.  Review of Systems:  Positive and negative as per HPI otherwise all other systems are negative  Past Medical History: Past Medical History  Diagnosis Date  . Osteoarthritis   . Macular degeneration   . Hypertension   . Thyroid disease   . Thyroid cancer   . GI bleed   . Atrial fibrillation   . C. difficile colitis   . Diverticulitis    Past Surgical History  Procedure Laterality Date  . Thyroidectomy    . Colonoscopy  03/17/2011    Procedure: COLONOSCOPY;  Surgeon: Rogene Houston, MD;  Location: [facility];  Service: Endoscopy;  Laterality: N/A;    Medications: Prior to Admission medications   Medication Sig Start Date End Date Taking? Authorizing Provider  diazepam (VALIUM) 5 MG tablet Take 2.5 mg by mouth 2 (two) times daily as needed for anxiety.   Yes Historical Provider, MD  dicyclomine (BENTYL) 10 MG capsule Take 1 capsule (10 mg total) by mouth 4 (four) times daily -  before meals and at bedtime. 06/26/13  Yes Joni Reining, DO  HYDROcodone-acetaminophen (NORCO/VICODIN) 5-325 MG per tablet Take 1 tablet by mouth every 6 (six) hours as needed for moderate pain.   Yes Historical Provider, MD  levothyroxine (SYNTHROID, LEVOTHROID) 100 MCG tablet Take 100 mcg by mouth daily. Do not take synthroid on Mon and Thursdays   Yes Historical Provider, MD  metoprolol  (LOPRESSOR) 50 MG tablet Take 1 tablet (50 mg total) by mouth 2 (two) times daily. 06/26/13  Yes Joni Reining, DO  potassium chloride 20 MEQ TBCR Take 20 mEq by mouth daily. 06/26/13  Yes Joni Reining, DO  Probiotic Product (ALIGN PO) Take 1 tablet by mouth daily.   Yes Historical Provider, MD  promethazine (PHENERGAN) 12.5 MG tablet Take 0.5 tablets (6.25 mg total) by mouth every 4 (four) hours as needed for nausea or vomiting. 06/27/13  Yes Joni Reining, DO  vancomycin (VANCOCIN) 50 mg/mL oral solution Take 2.5 mLs (125 mg total) by mouth 4 (four) times daily. 06/26/13 07/09/13 Yes Joni Reining, DO    Allergies:   Allergies  Allergen Reactions  . Thyroid Hormones     proparathyroid thyroid medication    Social History:  reports that she has never smoked. She does not have any smokeless tobacco history on file. She reports that she does not drink alcohol or use illicit drugs.  Family History: History reviewed. No pertinent family history.  Physical Exam: Filed Vitals:   06/30/13 1722 06/30/13 2007  BP: 147/72 139/59  Pulse: 71 70  Temp: 98.7 F (37.1 C) 99.8 F (37.7 C)  TempSrc: Oral Oral  Resp: 20 18  Height: 5\' 7"  (1.702 m)   Weight: 71.668 kg (158 lb)   SpO2: 96%  92%   General appearance: alert, cooperative and no distress Head: Normocephalic, without obvious abnormality, atraumatic Eyes: negative Nose: Nares normal. Septum midline. Mucosa normal. No drainage or sinus tenderness. Neck: no JVD and supple, symmetrical, trachea midline Lungs: clear to auscultation bilaterally Heart: regular rate and rhythm, S1, S2 normal, no murmur, click, rub or gallop Abdomen: soft, non-tender; bowel sounds normal; no masses,  no organomegaly Extremities: extremities normal, atraumatic, no cyanosis or edema Pulses: 2+ and symmetric Skin: Skin color, texture, turgor normal. No rashes or lesions Neurologic: Grossly normal    Labs on Admission:   Recent Labs  06/30/13 1905  NA 138   K 3.7  CL 99  CO2 28  GLUCOSE 93  BUN 5*  CREATININE 0.66  CALCIUM 8.0*    Recent Labs  06/30/13 1905  WBC 8.5  NEUTROABS 5.9  HGB 13.2  HCT 39.2  MCV 88.3  PLT 443*    Radiological Exams on Admission: none  Assessment/Plan  80 yo female with cdiff colitis on vancomycin orally   Principal Problem:   Weakness generalized-  obs overnight.  PT and SW eval for possible short term rehab.  Labs normal.  Some ivf overnight.    Active Problems:   Atrial fibrillation   Clostridium difficile enteritis   Diarrhea  pcp dr Dub Mikes A 06/30/2013, 9:05 PM

## 2013-07-01 DIAGNOSIS — R112 Nausea with vomiting, unspecified: Secondary | ICD-10-CM

## 2013-07-01 DIAGNOSIS — A0472 Enterocolitis due to Clostridium difficile, not specified as recurrent: Secondary | ICD-10-CM

## 2013-07-01 DIAGNOSIS — E43 Unspecified severe protein-calorie malnutrition: Secondary | ICD-10-CM | POA: Insufficient documentation

## 2013-07-01 LAB — OCCULT BLOOD, POC DEVICE: Fecal Occult Bld: NEGATIVE

## 2013-07-01 LAB — CLOSTRIDIUM DIFFICILE BY PCR: CDIFFPCR: POSITIVE — AB

## 2013-07-01 MED ORDER — ALUM & MAG HYDROXIDE-SIMETH 200-200-20 MG/5ML PO SUSP
15.0000 mL | ORAL | Status: DC | PRN
  Administered 2013-07-02: 15 mL via ORAL
  Filled 2013-07-01 (×2): qty 30

## 2013-07-01 MED ORDER — HYDROCODONE-ACETAMINOPHEN 5-325 MG PO TABS
1.0000 | ORAL_TABLET | ORAL | Status: DC | PRN
  Administered 2013-07-01 – 2013-07-02 (×5): 1 via ORAL
  Filled 2013-07-01 (×6): qty 1

## 2013-07-01 MED ORDER — STERILE WATER FOR INJECTION IJ SOLN: 250.0000 mg | Freq: Four times a day (QID) | INTRAMUSCULAR | Status: DC

## 2013-07-01 MED ORDER — VANCOMYCIN 50 MG/ML ORAL SOLUTION
125.0000 mg | ORAL | Status: AC
  Administered 2013-07-01: 125 mg via ORAL
  Filled 2013-07-01: qty 2.5

## 2013-07-01 MED ORDER — SODIUM CHLORIDE 0.9 % IV SOLN
INTRAVENOUS | Status: AC
  Administered 2013-07-01: 04:00:00 via INTRAVENOUS

## 2013-07-01 MED ORDER — VANCOMYCIN 50 MG/ML ORAL SOLUTION
ORAL | Status: AC
  Filled 2013-07-01: qty 5

## 2013-07-01 MED ORDER — LIDOCAINE VISCOUS 2 % MT SOLN
15.0000 mL | Freq: Four times a day (QID) | OROMUCOSAL | Status: DC | PRN
  Administered 2013-07-01: 15 mL via OROMUCOSAL
  Filled 2013-07-01 (×2): qty 15

## 2013-07-01 MED ORDER — ENSURE COMPLETE PO LIQD
237.0000 mL | Freq: Two times a day (BID) | ORAL | Status: DC
  Administered 2013-07-02 – 2013-07-04 (×3): 237 mL via ORAL

## 2013-07-01 MED ORDER — SACCHAROMYCES BOULARDII 250 MG PO CAPS
250.0000 mg | ORAL_CAPSULE | Freq: Two times a day (BID) | ORAL | Status: DC
  Administered 2013-07-01 – 2013-07-04 (×6): 250 mg via ORAL
  Filled 2013-07-01 (×6): qty 1

## 2013-07-01 MED ORDER — VANCOMYCIN 50 MG/ML ORAL SOLUTION
250.0000 mg | Freq: Four times a day (QID) | ORAL | Status: DC
  Administered 2013-07-01 – 2013-07-04 (×11): 250 mg via ORAL
  Filled 2013-07-01 (×12): qty 5

## 2013-07-01 NOTE — Evaluation (Signed)
Physical Therapy Evaluation Patient Details Name: Danielle [name] MRN: [id number] DOB: July 20, 1930 Today's Date: 07/01/2013 Time: 1540-0867 PT Time Calculation (min): 41 min  PT Assessment / Plan / Recommendation History of Present Illness  Pt was treated at [facility] last week with C Diff infection.  After she returned home she continued to have diarrhea, had stomach cramps after eating and therefore had a poor appetite.  She continues to feel weak and was readmitted last night.  Pt reports having a hx of OA and LE.  Clinical Impression   Pt was seen for evaluation.  She is deconditioned but mobility is only mildly impaired.  She is still needing a walker for gait (normally ambulates with no AD).  She should have no problem transitioning to home with HHPT.    PT Assessment  All further PT needs can be met in the next venue of care    Follow Up Recommendations  Home health PT                Equipment Recommendations  None recommended by PT               Precautions / Restrictions Precautions Precautions: None Restrictions Weight Bearing Restrictions: No         Mobility  Bed Mobility Overal bed mobility: Independent Supine to sit: Independent Sit to supine: Independent Transfers Overall transfer level: Independent Equipment used: None Transfers: Sit to/from Stand Sit to Stand: Modified independent (Device/Increase time) Ambulation/Gait Ambulation/Gait assistance: Supervision Ambulation Distance (Feet): 200 Feet Gait Pattern/deviations: WFL(Within Functional Limits) Gait velocity interpretation: at or above normal speed for age/gender General Gait Details: right ankle pronates during stance...pt states that she has OA throughout her body and isn't sure why this happens         PT Diagnosis: Difficulty walking  PT Problem List: Decreased activity tolerance;Decreased mobility   PT Goals(Current goals can be found in the care plan section) Acute Rehab PT Goals PT  Goal Formulation: No goals set, d/c therapy  Visit Information  Last PT Received On: 07/01/13 History of Present Illness: Pt was treated at [facility] last week with C Diff infection.  After she returned home she continued to have diarrhea, had stomach cramps after eating and therefore had a poor appetite.  She continues to feel weak and was readmitted last night.       Prior George expects to be discharged to:: Private residence Living Arrangements: Spouse/significant other Available Help at Discharge: Family;Available 24 hours/day Type of Home: House Home Access: Ramped entrance Home Layout: One level Home Equipment: Walker - 2 wheels Prior Function Level of Independence: Independent Comments: uses a walker at times, when feeling weak. Communication Communication: No difficulties    Cognition  Cognition Arousal/Alertness: Awake/alert Behavior During Therapy: WFL for tasks assessed/performed Overall Cognitive Status: Within Functional Limits for tasks assessed    Extremity/Trunk Assessment Lower Extremity Assessment Lower Extremity Assessment: Overall WFL for tasks assessed   Balance Balance Overall balance assessment: No apparent balance deficits (not formally assessed)  End of Session PT - End of Session Equipment Utilized During Treatment: Gait belt Activity Tolerance: Patient tolerated treatment well Patient left: in bed;with call bell/phone within reach Nurse Communication: Mobility status  GP Functional Assessment Tool Used: clinical judgement Functional Limitation: Mobility: Walking and moving around Mobility: Walking and Moving Around Current Status (Y1950): At least 1 percent but less than 20 percent impaired, limited or restricted Mobility: Walking and Moving Around Goal Status [phone]): At  least 1 percent but less than 20 percent impaired, limited or restricted Mobility: Walking and Moving Around Discharge Status [phone]): At least 1  percent but less than 20 percent impaired, limited or restricted   Sable Feil 07/01/2013, 9:35 AM

## 2013-07-01 NOTE — Discharge Summary (Signed)
Patient managed with resident team.  C diff in GI panel and improved at discharge.  Appropriate follow ups scheduled.

## 2013-07-01 NOTE — Progress Notes (Signed)
UR completed. Patient changed to inpatient- failed op treatment for cdiff

## 2013-07-01 NOTE — Consult Note (Signed)
Reason for Consult: c diff Referring Physician: Hospitalist  Danielle [name] is an 80 y.o. female.  HPI: Admitted thru the ED yesterday with c/o diarrhea. She has been having 6-7 stools for over a week. Crampy abdominal pain.  She did not see any blood, though her stools were dark. Recently admitted to [facility] with a diagnosis of diverticultis She was placed on Cipro and Flagyl. She was at [facility] for approximately 10 days. She was also found to have C-difficile after being placed on antibiotics. Apparently developed Atrial fib (new onset)  while in hospital.  She was felt not to be a candidate for anticoagulant therapy due to her hx of GIB. 06/20/2013 C-diff negative. On 06/23/2013 C diff positive.   On discharge she was started on Vancomycin po.  She tells me her abdomen cramps when she eats.  She says she does not feel good. After she eats she feels bad. She has nausea. There has been no fever.  She has had 2 stools today and they were loose. Her stool was guaiac negative yesterday.  No NSAIDs Hx of arthritis and takes pain medication on a regular basis.   06/22/2013 CT abdomen/pelvis with CM: IMPRESSION:  1. Interval increase in bilateral pleural effusions. No evidence of  pneumonia.  2. Persistent long segment of distal small bowel wall thickening  leading up to terminal ileum. Favor terminal ileitis but cannot   exclude inflammatory bowel disease.  3. Long segment of bowel wall thickening involving the proximal  sigmoid colon with mild pericolonic thickening consistent with  diverticulitis. No evidence of progression compared to prior.  4. Soft tissue and calcified lesions in the left adnexal region are  unchanged from prior  06/17/2013 CT angio abdomen/pelvis with CM: IMPRESSION:  1. Large amount of predominantly calcified atherosclerotic plaque  within normal caliber abdominal aorta, not resulting in  hemodynamically significant stenosis. No abdominal aortic dissection  or  evidence of mesenteric ischemia.  2. Findings again suggestive of acute uncomplicated diverticulitis  involving the sigmoid colon within the left hemipelvis. No definite  evidence of perforation or definable/drainable fluid collection.  3. Nonspecific mild though rather diffuse apparent bowel wall  thickening involving the distal small bowel, not resulting in  enteric obstruction - while possibly reactive secondary to ongoing  acute diverticulitis, a secondary enteritis may have a similar  appearance. Clinical correlation is advised.  4. Indeterminate approximately 2.5 cm nodular soft tissue structure  adjacent to the posterior wall of the sigmoid colon within the left  hemipelvis. This structure is indeterminate with differential  considerations including an enlarged possibly reactive left pelvic  sidewall lymph node, a focally dilated left-sided gonadal vein or  possibly an underdistended enlarged diverticulum.  5. Unchanged peripherally calcifying approximately 2.9 cm left-sided  adnexal lesion nonspecific though possibly representative of a  partially calcified ovarian dermoid.  6. Suspected Paget's disease involving the right ilium.  06/17/2013 CT abdomen/pelvis with CM: IMPRESSION:  1. Patchy atelectasis or infiltrate in left lower lobe.  2. Multiple colonic diverticula. Multiple sigmoid colon diverticula.  There is mild pericolonic stranding and small amount of pericolonic  fluid proximal sigmoid colon in left lower quadrant suspicious for  mild diverticulitis. No diverticular abscess.  3. Distended urinary bladder without evidence of calcified calculi.  4. Infraumbilical midline ventral hernia containing fat measures  about 3.7 cm.  5. Normal appendix. No pericecal inflammation.  6. Atherosclerotic vascular calcifications.  7. Mild distended gallbladder. Tiny layering gallstones.  8. Calcified lesion in  left adnexa measures 2.3 cm. Please see above  discussion.    03/2011  Colonoscopy Dr. Laural Golden: Examination performed to cecum.  Pan colonic diverticulosis; most of the diverticula are located in sigmoid and one with changes of diverticulitis felt to be source of GI bleed. No active bleeding noted therefore no therapy rendered.   06/18/2012 Echo:  Mild basal septal hypertrophy with LVEF 40-45% (looks better in some views), septal dyssynergy with probable anteroseptal hypokinesis (LBBB likely contributes), grade 1 diastolic dysfunction. Moderate biatrial enlargement. MAC with mild mitral regurgitation. Moderate tricuspid regurgitation with PASP 49 mmHg and elevated CVP.   Past Medical History  Diagnosis Date  . Osteoarthritis   . Macular degeneration   . Hypertension   . Thyroid disease   . Thyroid cancer   . GI bleed   . Atrial fibrillation   . C. difficile colitis   . Diverticulitis     Past Surgical History  Procedure Laterality Date  . Thyroidectomy    . Colonoscopy  03/17/2011    Procedure: COLONOSCOPY;  Surgeon: Rogene Houston, MD;  Location: [facility];  Service: Endoscopy;  Laterality: N/A;    History reviewed. No pertinent family history.  Social History:  reports that she has never smoked. She does not have any smokeless tobacco history on file. She reports that she does not drink alcohol or use illicit drugs.  Allergies:  Allergies  Allergen Reactions  . Thyroid Hormones     proparathyroid thyroid medication    Medications: I have reviewed the patient's current medications.  Results for orders placed during the hospital encounter of 06/30/13 (from the past 48 hour(s))  BASIC METABOLIC PANEL     Status: Abnormal   Collection Time    06/30/13  7:05 PM      Result Value Ref Range   Sodium 138  137 - 147 mEq/L   Potassium 3.7  3.7 - 5.3 mEq/L   Chloride 99  96 - 112 mEq/L   CO2 28  19 - 32 mEq/L   Glucose, Bld 93  70 - 99 mg/dL   BUN 5 (*) 6 - 23 mg/dL   Creatinine, Ser 0.66  0.50 - 1.10 mg/dL   Calcium 8.0 (*) 8.4 - 10.5  mg/dL   GFR calc non Af Amer 81 (*) >90 mL/min   GFR calc Af Amer >90  >90 mL/min   Comment: (NOTE)     The eGFR has been calculated using the CKD EPI equation.     This calculation has not been validated in all clinical situations.     eGFR's persistently <90 mL/min signify possible Chronic Kidney     Disease.  CBC WITH DIFFERENTIAL     Status: Abnormal   Collection Time    06/30/13  7:05 PM      Result Value Ref Range   WBC 8.5  4.0 - 10.5 K/uL   RBC 4.44  3.87 - 5.11 MIL/uL   Hemoglobin 13.2  12.0 - 15.0 g/dL   HCT 39.2  36.0 - 46.0 %   MCV 88.3  78.0 - 100.0 fL   MCH 29.7  26.0 - 34.0 pg   MCHC 33.7  30.0 - 36.0 g/dL   RDW 16.1 (*) 11.5 - 15.5 %   Platelets 443 (*) 150 - 400 K/uL   Neutrophils Relative % 69  43 - 77 %   Neutro Abs 5.9  1.7 - 7.7 K/uL   Lymphocytes Relative 13  12 - 46 %  Lymphs Abs 1.1  0.7 - 4.0 K/uL   Monocytes Relative 15 (*) 3 - 12 %   Monocytes Absolute 1.3 (*) 0.1 - 1.0 K/uL   Eosinophils Relative 2  0 - 5 %   Eosinophils Absolute 0.2  0.0 - 0.7 K/uL   Basophils Relative 1  0 - 1 %   Basophils Absolute 0.1  0.0 - 0.1 K/uL  I-STAT CG4 LACTIC ACID, ED     Status: None   Collection Time    06/30/13  7:49 PM      Result Value Ref Range   Lactic Acid, Venous 1.66  0.5 - 2.2 mmol/L  OCCULT BLOOD, POC DEVICE     Status: None   Collection Time    06/30/13  8:27 PM      Result Value Ref Range   Fecal Occult Bld NEGATIVE  NEGATIVE  CLOSTRIDIUM DIFFICILE BY PCR     Status: Abnormal   Collection Time    07/01/13  1:10 AM      Result Value Ref Range   C difficile by pcr POSITIVE (*) NEGATIVE   Comment: RESULT CALLED TO, READ BACK BY AND VERIFIED WITH:     WALKER L AT 0225 ON [id number] BY FORSYTH K    No results found.  ROS Blood pressure 130/75, pulse 65, temperature 98 F (36.7 C), temperature source Oral, resp. rate 20, height $RemoveBe'5\' 8"'nVTTiQWrQ$  (1.727 m), weight 153 lb 3.5 oz (69.5 kg), SpO2 97.00%. Physical Exam Alert and oriented. Skin warm and dry. Oral  mucosa is moist.   . Sclera anicteric, conjunctivae is pink. Thyroid not enlarged. No cervical lymphadenopathy. Lungs clear. Heart regular rate and rhythm.  Abdomen is soft. Bowel sounds are positive. No hepatomegaly. No abdominal masses felt. Diffuse tenderness.  No edema to lower extremities.  Assessment/Plan: C difficile. Recent antibiotics for diverticulitis felt to be source of her c-difficile. Increase Vancomycin to $RemoveBefor'250mg'UjRjDxGqowBU$  QID. Will continue to monitor.   SETZER,TERRI W 07/01/2013, 1:44 PM    GI attending note; Patient interviewed and examined; Three imaging studies from recent hospitalization reviewed. Patient has C. difficile colitis with slow recovery and failure to thrive. She does not appear to be acutely ill or toxic. Patient's condition and prognosis discussed at length with her husband Laverna Peace. As above vancomycin has doubled and she will need more than two weeks of therapy. Will also begin patient on Florastor.

## 2013-07-01 NOTE — Care Management Note (Addendum)
    Page 1 of 2   07/04/2013     8:49:25 AM   CARE MANAGEMENT NOTE 07/04/2013  Patient:  Danielle [name], Danielle [name]   Account Number:  [id number]  Date Initiated:  07/01/2013  Documentation initiated by:  Theophilus Kinds  Subjective/Objective Assessment:   Pt admitted from home with C diff. Pt lives with her husband and will return home at discharge. Pt is fairly independent with ADL's. Pt has a walker and BSC and is active with [facility] RN and PT.     Action/Plan:   Pt wants to keep [facility] at discharge (per her choice). Will arrange resumption of HH at discharge.   Anticipated DC Date:  07/04/2013   Anticipated DC Plan:  [place]  CM consult      [facility] Choice  Resumption Of Svcs/PTA Provider   Choice offered to / List presented to:  C-1 Patient        [place] arranged  HH-1 RN  [place].   Status of service:  Completed, signed off Medicare Important Message given?  YES (If response is "NO", the following Medicare IM given date fields will be blank) Date Medicare IM given:  07/04/2013 Date Additional Medicare IM given:    Discharge Disposition:  [place]  Per UR Regulation:    If discussed at Long Length of Stay Meetings, dates discussed:    Comments:  07/04/13 0845 Christinia Gully, RN BSN CM Pt discharged home today with resumption of [facility] (per pts choice). Linda lothian of [facility] is aware and will collect the pts information from the chart. [place] services to start within 48 hours of discharge. No DME needs noted. Pt and pts nurse aware of discharge arrangements.  07/01/13 [place], RN BSN CM

## 2013-07-01 NOTE — Progress Notes (Signed)
Tested positive for c-diff. Brown contact isolation remains in effect.

## 2013-07-01 NOTE — Progress Notes (Signed)
NAME:  Danielle [name], Danielle [name] NO.:  [id number]  MEDICAL RECORD NO.:  [id number]  LOCATION:  A321                          FACILITY:  [facility]  PHYSICIAN:  Unk Lightning, MDDATE OF BIRTH:  09/28/1930  DATE OF PROCEDURE: DATE OF DISCHARGE:                                PROGRESS NOTE   SUBJECTIVE:  The patient is an 80 year old white female, who is caring for her husband, who has some blood dyscrasia, who was admitted to the hospital a week ago for C. Diff colitis.  She had 4-8 episodes of diarrhea since leaving the hospital on p.o. vancomycin q.i.d.  She comes due to generalized weakness.  Electrolytes and renal function, and hemoglobin seemed to be within normal limits.  She is hemodynamically stable.  The patient was intravascularly volume depleted.  Currently, continued on dicyclomine 10 a.c. t.i.d. as well as vancomycin 125 oral solution q.i.d.  OBJECTIVE:  LUNGS:  Clear.  No rales, wheeze, or rhonchi. HEART:  Regular rhythm.  No murmurs, gallops, or rubs. ABDOMEN:  Soft and nontender.  Bowel sounds normoactive.  No guarding, rebound, mass, or megaly.  PLAN:  Right now she is to continue p.o. vancomycin.  Monitor electrolytes and give intravenous fluids as we were doing.  Physical therapy consult for strengthening and ambulation, and obtain serum magnesium level.  Likewise, we will order GI consultation due to persistence of diarrhea, which may be multifactorial, and place her on lactose-free diet.     Unk Lightning, MD     RMD/MEDQ  D:  07/01/2013  T:  07/01/2013  Job:  [id number]

## 2013-07-01 NOTE — Clinical Social Work Psychosocial (Signed)
Clinical Social Work Department BRIEF PSYCHOSOCIAL ASSESSMENT 07/01/2013  Patient:  Danielle [name], Danielle [name]     Account Number:  [id number]     Admit date:  06/30/2013  Clinical Social Worker:  Wyatt Haste  Date/Time:  07/01/2013 10:44 AM  Referred by:  Physician  Date Referred:  07/01/2013 Referred for  SNF Placement   Other Referral:   Interview type:  Patient Other interview type:   husband also present    PSYCHOSOCIAL DATA Living Status:  HUSBAND Admitted from facility:   Level of care:   Primary support name:  Ardyth Gal Primary support relationship to patient:  SPOUSE Degree of support available:   supportive    CURRENT CONCERNS Current Concerns  Post-Acute Placement   Other Concerns:    SOCIAL WORK ASSESSMENT / PLAN CSW received referral from [facility]P stating pt needed rehab. Met with pt and husband at bedside. Pt reports it is just her and husband at home. They have a granddaughter who lives in [place], but they are self sufficient. Reported that pt cared for disabled husband. He has had back surgery in the past, but pt and husband indicate they manage fine. Pt has had Cdiff for several weeks. She was just d/c from [facility] last Friday after a 10 day stay. Pt staets that she really hasn't eaten in 14 days but has been taking sufficient fluids. Prior to this, pt was completely independent. She reports she now feels weak. She has had home health RN, PT through [facility]. Pt also has a walker and BSC at home. PT evaluated pt and recommendation is for home health. Pt and husband feel pt can return home and are not interesteed in placement. She is legally blind due to macular degeneration.   Assessment/plan status:  Referral to [employer] Other assessment/ plan:   Information/referral to community resources:   CM to resume home health    PATIENT'S/FAMILY'S RESPONSE TO PLAN OF CARE: Pt and husband report plan is to return home with home health. CM aware of need to resume  home health at d/c. CSW will sign off, but can be reconsulted if needed.       Benay Pike, [place]

## 2013-07-01 NOTE — Progress Notes (Signed)
INITIAL NUTRITION ASSESSMENT  DOCUMENTATION CODES Per approved criteria  -Severe malnutrition in the context of acute illness or injury   INTERVENTION: Ensure Complete po BID, each supplement provides 350 kcal and 13 grams of protein  NUTRITION DIAGNOSIS: Inadequate oral intake related to altered GI function as evidenced by poor appetite PTA, 2.5% wt loss x 1 week.   Goal: Pt will meet >90% of estimated nutritional needs  Monitor:  Diet advancement, PO intake, labs, skin assessments, weight changes, I/O's  Reason for Assessment: MST=2  80 y.o. female  Admitting Dx: Weakness generalized  ASSESSMENT: Pt reports concern over her nutritional status. She was recently discharged from [facility] on 06/09/13 for diverticulitis. Noted variable appetite during prior hospitalization (PO: 10-75%). She reports that she liked the Ensure supplement while she was there, but did not like the [employer]. 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   She reports very poor appetite over the past 10 days, trying to consume mostly liquids, but "it all runs right through me". She expresses a strong desire to eat in order to improve her health. Noted pt tested positive for C-Diff.  Wt hx reveals a 4# (2.5%) wt loss x 1 week, which is clinically significant. Pt confirmed weight loss, stating "I feel like I;'ve lost at least 10# after all I've been through".  Nutrition Focused Physical Exam:  Subcutaneous Fat:  Orbital Region: WDL Upper Arm Region: moderate depletion Thoracic and Lumbar Region: WDL  Muscle:  Temple Region: WDL Clavicle Bone Region: WDL Clavicle and Acromion Bone Region: WDL Scapular Bone Region: moderate depletion Dorsal Hand: moderate depletion Patellar Region: moderate depletion Anterior Thigh Region: moderate depletion Posterior Calf Region: WDL  Edema: none present  Pt meets criteria for severe MALNUTRITION in the context of  Acute illness as evidenced by <50%  energy intake x 5 days, 2.5% wt loss x 1 week, and moderate muscle mass depletion.  Height: Ht Readings from Last 1 Encounters:  06/30/13 5\' 8"  (1.727 m)    Weight: Wt Readings from Last 1 Encounters:  06/30/13 153 lb 3.5 oz (69.5 kg)    Ideal Body Weight: 140#  % Ideal Body Weight: 109%  Wt Readings from Last 10 Encounters:  06/30/13 153 lb 3.5 oz (69.5 kg)  06/26/13 157 lb 6.5 oz (71.4 kg)  03/28/11 169 lb 6.4 oz (76.839 kg)  03/16/11 165 lb 9.1 oz (75.1 kg)  03/16/11 165 lb 9.1 oz (75.1 kg)    Usual Body Weight: 165#  % Usual Body Weight: 96%  BMI:  Body mass index is 23.3 kg/(m^2). Meets criteria for normal weight.   Estimated Nutritional Needs: Kcal: 1200-1300 daily Protein: 56-70 grams daily Fluid: 1.2-1.3 L daily  Skin: WDL  Diet Order: Full Liquid  EDUCATION NEEDS: -Education needs addressed  No intake or output data in the 24 hours ending 07/01/13 1221  Last BM: 07/01/13   Labs:   Recent Labs Lab 06/25/13 0419 06/26/13 0312 06/30/13 1905  NA 135* 138 138  K 3.1* 3.2* 3.7  CL 100 100 99  CO2 24 25 28   BUN 5* 3* 5*  CREATININE 0.60 0.60 0.66  CALCIUM 7.1* 7.3* 8.0*  MG 1.8  --   --   GLUCOSE 92 94 93    CBG (last 3)  No results found for this basename: GLUCAP,  in the last 72 hours  Scheduled Meds: . dicyclomine  10 mg Oral TID AC & HS  . levothyroxine  100 mcg Oral Once per  day on Sun Tue Wed Fri Sat  . metoprolol  50 mg Oral BID  . potassium chloride  20 mEq Oral Daily  . sodium chloride  3 mL Intravenous Q12H  . vancomycin  125 mg Oral QID    Continuous Infusions: . sodium chloride 75 mL/hr at 07/01/13 0418    Past Medical History  Diagnosis Date  . Osteoarthritis   . Macular degeneration   . Hypertension   . Thyroid disease   . Thyroid cancer   . GI bleed   . Atrial fibrillation   . C. difficile colitis   . Diverticulitis     Past Surgical History  Procedure Laterality Date  . Thyroidectomy    . Colonoscopy   03/17/2011    Procedure: COLONOSCOPY;  Surgeon: Rogene Houston, MD;  Location: [facility];  Service: Endoscopy;  Laterality: N/A;    Darrek Leasure A. Jimmye Norman, RD, LDN Pager: [phone]

## 2013-07-02 LAB — CBC
HCT: 37.3 % (ref 36.0–46.0)
Hemoglobin: 12.3 g/dL (ref 12.0–15.0)
MCH: 29.4 pg (ref 26.0–34.0)
MCHC: 33 g/dL (ref 30.0–36.0)
MCV: 89.2 fL (ref 78.0–100.0)
Platelets: 443 10*3/uL — ABNORMAL HIGH (ref 150–400)
RBC: 4.18 MIL/uL (ref 3.87–5.11)
RDW: 16.1 % — ABNORMAL HIGH (ref 11.5–15.5)
WBC: 8.6 10*3/uL (ref 4.0–10.5)

## 2013-07-02 LAB — BASIC METABOLIC PANEL
BUN: 5 mg/dL — ABNORMAL LOW (ref 6–23)
CO2: 25 mEq/L (ref 19–32)
Calcium: 7.6 mg/dL — ABNORMAL LOW (ref 8.4–10.5)
Chloride: 106 mEq/L (ref 96–112)
Creatinine, Ser: 0.71 mg/dL (ref 0.50–1.10)
GFR, EST NON AFRICAN AMERICAN: 79 mL/min — AB (ref >90)
GLUCOSE: 75 mg/dL (ref 70–99)
POTASSIUM: 3.3 meq/L — AB (ref 3.7–5.3)
Sodium: 141 mEq/L (ref 137–147)

## 2013-07-02 MED ORDER — HYDROCODONE-ACETAMINOPHEN 5-325 MG PO TABS
2.0000 | ORAL_TABLET | ORAL | Status: DC | PRN
  Filled 2013-07-02: qty 2

## 2013-07-02 MED ORDER — HYDROCODONE-ACETAMINOPHEN 5-325 MG PO TABS
1.0000 | ORAL_TABLET | ORAL | Status: DC | PRN
  Administered 2013-07-02 – 2013-07-04 (×7): 1 via ORAL
  Filled 2013-07-02 (×6): qty 1

## 2013-07-02 MED ORDER — POTASSIUM CHLORIDE CRYS ER 20 MEQ PO TBCR
20.0000 meq | EXTENDED_RELEASE_TABLET | Freq: Every day | ORAL | Status: DC
  Administered 2013-07-02: 20 meq via ORAL
  Filled 2013-07-02 (×2): qty 1

## 2013-07-02 MED ORDER — ACETAMINOPHEN 325 MG PO TABS
650.0000 mg | ORAL_TABLET | Freq: Four times a day (QID) | ORAL | Status: DC | PRN
  Administered 2013-07-02: 650 mg via ORAL
  Filled 2013-07-02: qty 2

## 2013-07-02 MED ORDER — DICYCLOMINE HCL 10 MG PO CAPS
10.0000 mg | ORAL_CAPSULE | Freq: Two times a day (BID) | ORAL | Status: DC
  Administered 2013-07-03 (×2): 10 mg via ORAL
  Filled 2013-07-02 (×2): qty 1

## 2013-07-02 NOTE — Progress Notes (Signed)
Subjective; Patient continues to complain of weakness nausea but states diarrhea has slowed down and she is having less abdominal bloating and pain. Objective; BP 112/41  Pulse 62  Temp(Src) 98.1 F (36.7 C) (Oral)  Resp 18  Ht 5\' 8"  (1.727 m)  Wt 160 lb 11.5 oz (72.9 kg)  BMI 24.44 kg/m2  SpO2 98% Patient does not appear to be in distress. Abdomen is full small umbilicus hernia which is reducible and unchanged. Also the normal. Abdomen is soft and fullness in the right lower quadrant there percussion note is tympanitic. She has mild tenderness in the right upper and lower quadrant. No guarding. No LE edema.  Lab data; Serum sodium 141, potassium 3.3, chloride 106, CO2 25, BUN 5, creatinine 0.71 and glucose 75.  Assessment; C. difficile colitis resulting from therapy for sigmoid diverticulitis and slow response to therapy. Abdominal exam reveals less tenderness than yesterday. Hypokalemia. Patient receiving potassium supplement.  Recommendations; Decrease dicyclomine to 10 mg by mouth twice a day starting in a.m. Diet advance to low-residue diet. CBC with differential in a.m.

## 2013-07-02 NOTE — Progress Notes (Signed)
Pt's Grandaughter, Crawford Givens requesting that Dr. Laural Golden give her call to update her on pt's progress. Pt is agreeable to her granddaughter being informed on her progress. Darryll Capers can be reached at [phone]. She is agreeable to being called at any time.

## 2013-07-02 NOTE — Progress Notes (Signed)
[id number] 

## 2013-07-02 NOTE — Progress Notes (Signed)
Notified by NT that patients temp was 102.8. Patient does not have any tylenol ordered at this time other than norco. Notified MD of patients temp. MD placed order for tylenol, CBC, urine culture, and blood culture. Will continue to monitor the patient at this time.

## 2013-07-03 LAB — CBC WITH DIFFERENTIAL/PLATELET
Basophils Absolute: 0 10*3/uL (ref 0.0–0.1)
Basophils Relative: 1 % (ref 0–1)
Eosinophils Absolute: 0.2 10*3/uL (ref 0.0–0.7)
Eosinophils Relative: 3 % (ref 0–5)
HEMATOCRIT: 36.6 % (ref 36.0–46.0)
HEMOGLOBIN: 11.9 g/dL — AB (ref 12.0–15.0)
LYMPHS PCT: 19 % (ref 12–46)
Lymphs Abs: 1.4 10*3/uL (ref 0.7–4.0)
MCH: 29.3 pg (ref 26.0–34.0)
MCHC: 32.5 g/dL (ref 30.0–36.0)
MCV: 90.1 fL (ref 78.0–100.0)
MONO ABS: 0.9 10*3/uL (ref 0.1–1.0)
MONOS PCT: 13 % — AB (ref 3–12)
NEUTROS ABS: 4.6 10*3/uL (ref 1.7–7.7)
NEUTROS PCT: 64 % (ref 43–77)
Platelets: 379 10*3/uL (ref 150–400)
RBC: 4.06 MIL/uL (ref 3.87–5.11)
RDW: 16.4 % — ABNORMAL HIGH (ref 11.5–15.5)
WBC: 7.1 10*3/uL (ref 4.0–10.5)

## 2013-07-03 LAB — BASIC METABOLIC PANEL
BUN: 6 mg/dL (ref 6–23)
CHLORIDE: 104 meq/L (ref 96–112)
CO2: 28 mEq/L (ref 19–32)
Calcium: 7.5 mg/dL — ABNORMAL LOW (ref 8.4–10.5)
Creatinine, Ser: 0.66 mg/dL (ref 0.50–1.10)
GFR calc non Af Amer: 81 mL/min — ABNORMAL LOW (ref >90)
Glucose, Bld: 78 mg/dL (ref 70–99)
Potassium: 3.6 mEq/L — ABNORMAL LOW (ref 3.7–5.3)
SODIUM: 141 meq/L (ref 137–147)

## 2013-07-03 LAB — INFLUENZA PANEL BY PCR (TYPE A & B)
H1N1FLUPCR: NOT DETECTED
INFLAPCR: NEGATIVE
INFLBPCR: NEGATIVE

## 2013-07-03 MED ORDER — POTASSIUM CHLORIDE 10 MEQ/100ML IV SOLN
10.0000 meq | INTRAVENOUS | Status: AC
  Administered 2013-07-03 (×2): 10 meq via INTRAVENOUS
  Filled 2013-07-03 (×2): qty 100

## 2013-07-03 MED ORDER — POTASSIUM CHLORIDE 10 MEQ/100ML IV SOLN
10.0000 meq | INTRAVENOUS | Status: AC
  Administered 2013-07-03: 10 meq via INTRAVENOUS
  Filled 2013-07-03 (×2): qty 100

## 2013-07-03 MED ORDER — DICYCLOMINE HCL 10 MG PO CAPS
10.0000 mg | ORAL_CAPSULE | Freq: Three times a day (TID) | ORAL | Status: DC
  Administered 2013-07-03 – 2013-07-04 (×2): 10 mg via ORAL
  Filled 2013-07-03 (×2): qty 1

## 2013-07-03 NOTE — Progress Notes (Signed)
Subjective; Patient continues to feel poorly. She complains of nausea, anorexia and she has no taste. She also complains of nausea and heartburn and pain when she takes potassium. His eye for loose stools today but they have been small volume. He continues to complain of intermittent pain along the right side of her abdomen. She had fever with riders last night and her temp was 102.8. She had urine and blood cultures. She also had negative influenza panel by PCR.  Objective; BP 123/65  Pulse 61  Temp(Src) 98.2 F (36.8 C) (Oral)  Resp 18  Ht 5\' 8"  (1.727 m)  Wt 161 lb 2.5 oz (73.1 kg)  BMI 24.51 kg/m2  SpO2 95% Patient is alert and appears to be sick but she is in no acute distress. Abdominal exam reveals him like a hernia which is unchanged. Bowel sounds are normal. Abdomen is soft with mild tenderness at RUQ and RLQ. No rebound or guarding noted. No LE edema.  Lab data; WBC 7.1 and normal differential except monocytes 13 %. H&H 11.9 and 36.6 and platelet count 379K Serum potassium 3.6. BUN 6 and creatinine 0.66 Blood cultures negative Urine culture pending  Assessment; C. difficile colitis. Patient is on by mouth vancomycin and Saccharomyces Boulardii. Diarrhea and abdominal symptoms have improved but she had temp spike last night. WBC is normal. I do not believe temp spike secondary to C. difficile colitis. Since her recovery is rather slow will recheck C. difficile by PCR in a.m. and if it is still positive consider adding rifampin or switching her to Difficid.  Recommendations; Repeat C. difficile by PCR in a.m. Check serum albumin and pre-albumin. Change dicyclomine to 3 times a day.

## 2013-07-03 NOTE — Progress Notes (Signed)
[id number] 

## 2013-07-03 NOTE — Progress Notes (Signed)
NAME:  Danielle [name], BYRUM NO.:  [id number]  MEDICAL RECORD NO.:  [id number]  LOCATION:  A321                          FACILITY:  [facility]  PHYSICIAN:  Unk Lightning, MDDATE OF BIRTH:  24-Feb-1931  DATE OF PROCEDURE: DATE OF DISCHARGE:                                PROGRESS NOTE   SUBJECTIVE:  The patient is an 80 year old white female with C. difficile colitis, status post endoscopy, had pancolitis several years ago, complained of severe diarrhea despite vancomycin therapy, the vancomycin dosage was doubled to 250 q.i.d. orally.  Still complains of 3 episodes of diarrhea.  Potassium 3.6.  Renal function normal.  The patient has complaints of dysphagia, not feeling well, lightheadedness.  Blood pressure 132/64, she is afebrile, respiratory rate is 18  PLAN:  Right now is to increase IV fluids to 125 mL an hour, KCl 20 mEq p.o. daily.  The patient appears highly anxious, we will address this hopefully with oral medicines, hopefully discharge within 24 hours.     Unk Lightning, MD     RMD/MEDQ  D:  07/03/2013  T:  07/03/2013  Job:  [id number]

## 2013-07-03 NOTE — Progress Notes (Signed)
NAMEDONICE, ALPERIN NO.:  [id number]  MEDICAL RECORD NO.:  [id number]  LOCATION:  A321                          FACILITY:  [facility]  PHYSICIAN:  Unk Lightning, MDDATE OF BIRTH:  Oct 12, 1930  DATE OF PROCEDURE: DATE OF DISCHARGE:                                PROGRESS NOTE   SUBJECTIVE:  The patient currently in sinus rhythm with left bundle- branch block as C difficile colitis.  Appreciate expertise of GI, her vancomycin dosage has been doubled to 250 q.i.d. orally and will require two full weeks of therapy.  The patient still had continued diarrhea, five episodes of hypokalemia with potassium 3.3.  OBJECTIVE:  VITAL SIGNS:  Blood pressure 132/72.  She is afebrile. Respiratory rate is 18. LUNGS:  Clear.  No rales, wheeze, or rhonchi. HEART:  Regular rhythm.  No murmurs, gallops, or rubs. ABDOMEN:  Soft, nontender.  Bowel sounds normoactive.  No guarding, rebound, mass, or megaly.  PLAN:  Right now is to institute p.o. 40 mEq of KCl today.  Monitor BMET in a.m.,  electrolytes.  Continue vancomycin 250 q.i.d. for total of two weeks, and if diarrhea subsides we will consider discharge within 24-48 hours.     Unk Lightning, MD     RMD/MEDQ  D:  07/02/2013  T:  07/02/2013  Job:  [id number]

## 2013-07-04 LAB — BASIC METABOLIC PANEL
BUN: 4 mg/dL — ABNORMAL LOW (ref 6–23)
CO2: 25 meq/L (ref 19–32)
Calcium: 7.4 mg/dL — ABNORMAL LOW (ref 8.4–10.5)
Chloride: 105 mEq/L (ref 96–112)
Creatinine, Ser: 0.6 mg/dL (ref 0.50–1.10)
GFR calc Af Amer: >90 mL/min (ref >90)
GFR calc non Af Amer: 84 mL/min — ABNORMAL LOW (ref >90)
GLUCOSE: 79 mg/dL (ref 70–99)
POTASSIUM: 3.6 meq/L — AB (ref 3.7–5.3)
Sodium: 139 mEq/L (ref 137–147)

## 2013-07-04 LAB — PREALBUMIN: Prealbumin: 6.6 mg/dL — ABNORMAL LOW (ref 17.0–34.0)

## 2013-07-04 LAB — URINE CULTURE
Colony Count: 60000
Special Requests: NORMAL

## 2013-07-04 LAB — ALBUMIN: Albumin: 2 g/dL — ABNORMAL LOW (ref 3.5–5.2)

## 2013-07-04 MED ORDER — SACCHAROMYCES BOULARDII 250 MG PO CAPS: 250.0000 mg | ORAL_CAPSULE | Freq: Two times a day (BID) | ORAL | Status: DC

## 2013-07-04 MED ORDER — VANCOMYCIN 50 MG/ML ORAL SOLUTION: 250.0000 mg | Freq: Four times a day (QID) | ORAL | Status: AC

## 2013-07-04 NOTE — Discharge Summary (Signed)
[id number] 

## 2013-07-04 NOTE — Discharge Summary (Signed)
NAME:  ALEXIS, REBER NO.:  [id number]  MEDICAL RECORD NO.:  [id number]  LOCATION:  A321                          FACILITY:  [facility]  PHYSICIAN:  Unk Lightning, MDDATE OF BIRTH:  Aug 18, 1930  DATE OF ADMISSION:  06/30/2013 DATE OF DISCHARGE:  LH                              DISCHARGE SUMMARY   The patient is an 80 year old white female, who was admitted to the hospital 2 weeks ago, had abdominal discomfort, chronic diarrhea with acute exacerbation on chronic and was found to have C. difficile positivity by PCR.  She was discharged on vancomycin 125 p.o. q.i.d. and continued to have cramping and diarrhea.  She was admitted to our hospital.  The PCR still remains positive.  Seen in consultation by Gastroenterology that recognize she had a recent colonoscopy revealing signs of sigmoid diverticulitis, however, there was no need for a repeat colonoscopy in their opinion at this time.  Her belly and abdominal findings were benign throughout the hospital stay.  Hemodynamically she was stable.  Electrolytes were fine except some mild hypokalemia for which she was supplemented p.o. and IV.  She had a myriad of somatic complaints from anxiety, insomnia, nausea but no vomiting.  Tried to address the anxiety issues, and she was hopefully reassured. Gastroenterology increased her dosage of vancomycin from 125-250 p.o. q.i.d., and she was subsequently given home health care and discharged on the following medicines: FLorastor 250 mg p.o. t.i.d., vancomycin 250 mg p.o. q.i.d. for a period of 2 weeks, Valium 5 mg p.o. t.i.d. p.r.n., Bentyl 10 mg p.o. t.i.d. p.r.n., hydrocodone 5/325 p.o. q.i.d. p.r.n., Synthroid 100 mcg p.o. daily, Lopressor 50 mg p.o. b.i.d., potassium chloride 20 mEq p.o. daily, and probiotic align p.o. t.i.d.  She will take vancomycin for a period of 10 additional days from discharge and follow up in my office within 4-7 days to check electrolytes and  control of diarrhea which has improved.     Unk Lightning, MD     RMD/MEDQ  D:  07/04/2013  T:  07/04/2013  Job:  [id number]

## 2013-07-04 NOTE — Progress Notes (Signed)
UR chart review completed.  

## 2013-07-07 LAB — CULTURE, BLOOD (ROUTINE X 2)
CULTURE: NO GROWTH
CULTURE: NO GROWTH

## 2013-07-16 ENCOUNTER — Other Ambulatory Visit (INDEPENDENT_AMBULATORY_CARE_PROVIDER_SITE_OTHER): Payer: Self-pay | Admitting: Internal Medicine

## 2013-07-16 ENCOUNTER — Encounter (INDEPENDENT_AMBULATORY_CARE_PROVIDER_SITE_OTHER): Payer: Self-pay | Admitting: Internal Medicine

## 2013-07-16 ENCOUNTER — Ambulatory Visit (INDEPENDENT_AMBULATORY_CARE_PROVIDER_SITE_OTHER): Payer: Medicare Other | Admitting: Internal Medicine

## 2013-07-16 VITALS — BP 94/48 | HR 76 | Temp 98.0°F | Ht 67.5 in | Wt 142.9 lb

## 2013-07-16 DIAGNOSIS — A0472 Enterocolitis due to Clostridium difficile, not specified as recurrent: Secondary | ICD-10-CM

## 2013-07-16 DIAGNOSIS — R110 Nausea: Secondary | ICD-10-CM

## 2013-07-16 MED ORDER — ONDANSETRON HCL 4 MG PO TABS: 4.0000 mg | ORAL_TABLET | Freq: Three times a day (TID) | ORAL | Status: DC | PRN

## 2013-07-16 NOTE — Patient Instructions (Addendum)
C-diff.  Encourage fluids. C. Diff.. OV in 3 months. Imodium BID. One in am and one in pm.

## 2013-07-16 NOTE — Progress Notes (Signed)
Subjective:     Patient ID: Danielle [name], female   DOB: [birth year], 80 y.o.   MRN: [id number]  HPI Recently in the hospital an [facility]. Admitted with a diagnosis of C. Difficile. She was having 6-7 stools a day for over a week. She had crampy abdominal pain.  She had been at [facility] with a diagnosis of diverticulitis. She had been on Cipro and Flagyl.  After being on antibiotics she developed C-difficile while at [facility].  She was discharged on 250mg  Vancomycin QID. She finished the Vancomycin yesterday. She has had 4-6 stools today and same the day before. Her stools are still loose. Caregiver says her stools are much firmer. She has not taking any Imodium. Been on Vancomycin x 10 days. She is eating.She is forcing herself to eat.  She has lost about 18 pounds over the past 3 weeks while being sick.             06/22/2013 CT abdomen/pelvis with CM:  IMPRESSION:  1. Interval increase in bilateral pleural effusions. No evidence of  pneumonia.  2. Persistent long segment of distal small bowel wall thickening  leading up to terminal ileum. Favor terminal ileitis but cannot  exclude inflammatory bowel disease.  3. Long segment of bowel wall thickening involving the proximal  sigmoid colon with mild pericolonic thickening consistent with  diverticulitis. No evidence of progression compared to prior.  4. Soft tissue and calcified lesions in the left adnexal region are  unchanged from prior      Review of Systems Past Medical History  Diagnosis Date  . Osteoarthritis   . Macular degeneration   . Hypertension   . Thyroid disease   . Thyroid cancer   . GI bleed   . Atrial fibrillation   . C. difficile colitis   . Diverticulitis     Past Surgical History  Procedure Laterality Date  . Thyroidectomy    . Colonoscopy  03/17/2011    Procedure: COLONOSCOPY;  Surgeon: Rogene Houston, MD;  Location: [facility];  Service: Endoscopy;  Laterality: N/A;    Allergies   Allergen Reactions  . Thyroid Hormones     proparathyroid thyroid medication    Current Outpatient Prescriptions on File Prior to Visit  Medication Sig Dispense Refill  . diazepam (VALIUM) 5 MG tablet Take 2.5 mg by mouth 2 (two) times daily as needed for anxiety.      . dicyclomine (BENTYL) 10 MG capsule Take 1 capsule (10 mg total) by mouth 4 (four) times daily -  before meals and at bedtime.  60 capsule  0  . HYDROcodone-acetaminophen (NORCO/VICODIN) 5-325 MG per tablet Take 1 tablet by mouth every 6 (six) hours as needed for moderate pain.      Marland Kitchen levothyroxine (SYNTHROID, LEVOTHROID) 100 MCG tablet Take 100 mcg by mouth daily. Do not take synthroid on Mon and Thursdays      . potassium chloride 20 MEQ TBCR Take 20 mEq by mouth daily.  10 tablet  0  . Probiotic Product (ALIGN PO) Take 1 tablet by mouth daily.      Marland Kitchen saccharomyces boulardii (FLORASTOR) 250 MG capsule Take 1 capsule (250 mg total) by mouth 2 (two) times daily.  30 capsule  1   No current facility-administered medications on file prior to visit.        Objective:   Physical Exam  Filed Vitals:   07/16/13 1512  BP: 94/48  Pulse: 76  Temp: 98 F (  36.7 C)  Height: 5' 7.5" (1.715 m)  Weight: 142 lb 14.4 oz (64.819 kg)  Alert and oriented. Skin warm and dry. Oral mucosa is moist.   . Sclera anicteric, conjunctivae is pink. Thyroid not enlarged. No cervical lymphadenopathy. Lungs clear. Heart regular rate and rhythm.  Abdomen is soft. Bowel sounds are positive. No hepatomegaly. No abdominal masses felt. No tenderness.  No edema to lower extremities.     Assessment:     C-difficile. Finished the Vancomycin. Her stools have improved somewhat. Stools are more formed. She still has a poor appetite per caregiver. I discussed this case with Dr. Laural Golden.      Plan:     C difficile study. Imodium BID.        Rx for Zofran eprescribed to her pharmacy prn. Bentyl on a prn basis for stomach cramps.

## 2013-07-17 ENCOUNTER — Ambulatory Visit: Payer: Medicare Other | Admitting: Cardiovascular Disease

## 2013-07-17 ENCOUNTER — Encounter: Payer: Self-pay | Admitting: Internal Medicine

## 2013-07-17 ENCOUNTER — Ambulatory Visit (INDEPENDENT_AMBULATORY_CARE_PROVIDER_SITE_OTHER): Payer: Medicare Other | Admitting: Internal Medicine

## 2013-07-17 VITALS — BP 82/49 | HR 99 | Ht 67.5 in | Wt 137.0 lb

## 2013-07-17 DIAGNOSIS — I4891 Unspecified atrial fibrillation: Secondary | ICD-10-CM

## 2013-07-17 DIAGNOSIS — K529 Noninfective gastroenteritis and colitis, unspecified: Secondary | ICD-10-CM

## 2013-07-17 DIAGNOSIS — K5289 Other specified noninfective gastroenteritis and colitis: Secondary | ICD-10-CM

## 2013-07-17 DIAGNOSIS — R7989 Other specified abnormal findings of blood chemistry: Secondary | ICD-10-CM

## 2013-07-17 DIAGNOSIS — I447 Left bundle-branch block, unspecified: Secondary | ICD-10-CM

## 2013-07-17 DIAGNOSIS — R42 Dizziness and giddiness: Secondary | ICD-10-CM

## 2013-07-17 DIAGNOSIS — R799 Abnormal finding of blood chemistry, unspecified: Secondary | ICD-10-CM

## 2013-07-17 DIAGNOSIS — R778 Other specified abnormalities of plasma proteins: Secondary | ICD-10-CM

## 2013-07-17 LAB — CBC
HCT: 40.3 % (ref 36.0–46.0)
Hemoglobin: 12.9 g/dL (ref 12.0–15.0)
MCH: 28.7 pg (ref 26.0–34.0)
MCHC: 32 g/dL (ref 30.0–36.0)
MCV: 89.8 fL (ref 78.0–100.0)
PLATELETS: 469 10*3/uL — AB (ref 150–400)
RBC: 4.49 MIL/uL (ref 3.87–5.11)
RDW: 16.2 % — ABNORMAL HIGH (ref 11.5–15.5)
WBC: 8.6 10*3/uL (ref 4.0–10.5)

## 2013-07-17 LAB — BASIC METABOLIC PANEL
BUN: 12 mg/dL (ref 6–23)
CHLORIDE: 105 meq/L (ref 96–112)
CO2: 28 meq/L (ref 19–32)
Calcium: 8 mg/dL — ABNORMAL LOW (ref 8.4–10.5)
Creat: 0.95 mg/dL (ref 0.50–1.10)
Glucose, Bld: 103 mg/dL — ABNORMAL HIGH (ref 70–99)
Potassium: 4.4 mEq/L (ref 3.5–5.3)
Sodium: 143 mEq/L (ref 135–145)

## 2013-07-17 NOTE — Progress Notes (Signed)
[id number] 

## 2013-07-17 NOTE — Patient Instructions (Addendum)
Your physician recommends that you schedule a follow-up appointment in: 3-4 weeks with K.lawrence NP  Please get blood work NOW    Stop metoprolol    Thank you for choosing [facility] !

## 2013-07-17 NOTE — Progress Notes (Signed)
HPI  Patient is an 80 yo who is married to a patient I follow Ardyth Gal Palma Sola)  [facility] was previously followed by Lowella Dell She was hospitalized in early Feb with diverticulitis  She had a elevated troponin  EKG with LBBB (new) Cardiology was consulted  Echo showed LVEF of 40 to 45% with septal hypokinesis. CT of abdomen showed atherosclerosis of aorta but patent branch vessels. Plan was for [facility] when patient's clinical status improved.   She was admitted later in Feb with continued diarrha  PCR positive for C Diff  Continued ABX   The patient denies CP  Has been weak since discharge  Diarrhea is slowly improving. She has dizziness but no syncope   Allergies  Allergen Reactions  . Thyroid Hormones     proparathyroid thyroid medication    Current Outpatient Prescriptions  Medication Sig Dispense Refill  . diazepam (VALIUM) 5 MG tablet Take 2.5 mg by mouth 2 (two) times daily as needed for anxiety.      . dicyclomine (BENTYL) 10 MG capsule Take 1 capsule (10 mg total) by mouth 4 (four) times daily -  before meals and at bedtime.  60 capsule  0  . HYDROcodone-acetaminophen (NORCO/VICODIN) 5-325 MG per tablet Take 1 tablet by mouth every 6 (six) hours as needed for moderate pain.      Marland Kitchen levothyroxine (SYNTHROID, LEVOTHROID) 100 MCG tablet Take 100 mcg by mouth daily. Do not take synthroid on Mon and Thursdays      . metoprolol (LOPRESSOR) 50 MG tablet Take 25 mg by mouth 2 (two) times daily.      . potassium chloride 20 MEQ TBCR Take 20 mEq by mouth daily.  10 tablet  0  . Probiotic Product (ALIGN PO) Take 1 tablet by mouth daily.      . promethazine (PHENERGAN) 25 MG tablet Take 25 mg by mouth every 6 (six) hours as needed for nausea or vomiting.      . ranitidine (ZANTAC) 150 MG capsule Take 150 mg by mouth 2 (two) times daily.      Marland Kitchen saccharomyces boulardii (FLORASTOR) 250 MG capsule Take 1 capsule (250 mg total) by mouth 2 (two) times daily.  30 capsule  1  . ondansetron (ZOFRAN) 4 MG  tablet Take 1 tablet (4 mg total) by mouth every 8 (eight) hours as needed for nausea or vomiting.  30 tablet  0   No current facility-administered medications for this visit.    Past Medical History  Diagnosis Date  . Osteoarthritis   . Macular degeneration   . Hypertension   . Thyroid disease   . Thyroid cancer   . GI bleed   . Atrial fibrillation   . C. difficile colitis   . Diverticulitis     Past Surgical History  Procedure Laterality Date  . Thyroidectomy    . Colonoscopy  03/17/2011    Procedure: COLONOSCOPY;  Surgeon: Rogene Houston, MD;  Location: [facility];  Service: Endoscopy;  Laterality: N/A;    No family history on file.  History   Social History  . Marital Status: Married    Spouse Name: N/A    Number of Children: 2  . Years of Education: Post HS   Occupational History  . 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 Smoking status: Never Smoker   . Smokeless tobacco: Not on file  . Alcohol Use: No  .  Drug Use: No  . Sexual Activity: No   Other Topics Concern  . Not on file   Social History Narrative   Lives with her husband, active.    Review of Systems:  All systems reviewed.  They are negative to the above problem except as previously stated.  Vital Signs: BP 82/49  Pulse 99  Ht 5' 7.5" (1.715 m)  Wt 137 lb (62.143 kg)  BMI 21.13 kg/m2  Physical Exam Patient is in NAD HEENT:  Normocephalic, atraumatic. EOMI, PERRLA.  Neck: JVP is normal.  No bruits.  Lungs: clear to auscultation. No rales no wheezes.  Heart: Regular rate and rhythm. Normal S1, S2. No S3.   No significant murmurs. PMI not displaced.  Abdomen:  Supple, nontender. Normal bowel sounds. No masses. No hepatomegaly.  Extremities:   Good distal pulses throughout. No lower extremity edema.  Musculoskeletal :moving all extremities.  Neuro:   alert and oriented x3.  CN II-XII grossly intact.  EKG  SR 86   LBBB Assessment and Plan: 1.  Dizziness  BP on my check was 88/  She is weak but no falling/syncope She should get labs today.  Stop metoprolol Increased fluids/salt.  2.  Atrial fib  Now in sinus  CHADS2Vasc score high  But, with recent events and low BP i would not recomm for now  3.  LBBB.  Abnormal echo may be related to BBB.  She denies CP  But, has evid of atherosclerosis. Will get myoview once stabilize.

## 2013-07-18 NOTE — Progress Notes (Signed)
NAMEKARINDA, CABRIALES NO.:  [id number]  MEDICAL RECORD NO.:  [id number]  LOCATION:                                 FACILITY:  PHYSICIAN:  Unk Lightning, MDDATE OF BIRTH:  1931-04-24  DATE OF PROCEDURE:  07/17/2013 DATE OF DISCHARGE:  07/04/2013                                PROGRESS NOTE   Answer to coding query in the form of a progress note on patient Danielle [name] in order to document severity of the illness.  The patient had diminished oral intake with diagnosis of colitis which was caused by the organism C. difficile as well as history of diverticulosis and possible diverticulitis.  She had diminished oral intake by at least 50% for a period of 5 days due to her acute illness and was given some bowel rest and was subsequently prescribed Ensure 1 can p.o. b.i.d. complete.  She did meet the criteria for acute severe malnutrition based on less than 50% energy intake for 5 days.  I agree with this.  Her possible clinical condition is moderate malnutrition.     Unk Lightning, MD     RMD/MEDQ  D:  07/17/2013  T:  07/18/2013  Job:  [id number]

## 2013-07-21 ENCOUNTER — Other Ambulatory Visit (INDEPENDENT_AMBULATORY_CARE_PROVIDER_SITE_OTHER): Payer: Self-pay | Admitting: Internal Medicine

## 2013-07-21 DIAGNOSIS — IMO0002 Reserved for concepts with insufficient information to code with codable children: Secondary | ICD-10-CM

## 2013-07-21 MED ORDER — MEGESTROL ACETATE 400 MG/10ML PO SUSP: 400.0000 mg | Freq: Every day | ORAL | Status: DC

## 2013-07-21 NOTE — Telephone Encounter (Signed)
close

## 2013-07-27 ENCOUNTER — Encounter (INDEPENDENT_AMBULATORY_CARE_PROVIDER_SITE_OTHER): Payer: Self-pay | Admitting: Internal Medicine

## 2013-07-30 ENCOUNTER — Encounter: Payer: Self-pay | Admitting: Internal Medicine

## 2013-07-31 ENCOUNTER — Telehealth: Payer: Self-pay | Admitting: *Deleted

## 2013-07-31 NOTE — Telephone Encounter (Signed)
Pt states she is not sure of other BPs and heart rates. Pt was seen by Dr Harrington Challenger  in the [place] office and has an appt with Jory Sims in [place] 08/08/13. I will forward to [facility] office for follow up.

## 2013-07-31 NOTE — Telephone Encounter (Addendum)
07/31/13 Copied from [place] from patient:  i dr. my home health nurse checked my blood pressure and it has been running high. they stopped my Norvasc at [facility] and you stopped my motoporal should I start either of these back  07/31/13 pt states Home Health Nurse checked  BP yesterday it was 160?/?Marland Kitchen

## 2013-07-31 NOTE — Telephone Encounter (Signed)
[phone] who occasionally drops by to monitor pts BP Per patient her reading today was "166/over something"   At last ov with Dr.Ross pt had C-Diff and was hypotensive 88/systolic and BB was stopped at that time.Patient states that her Norvasc was d/c'd during the hospital admission.  Patient wonders if you would like her to return to her BP meds.She has a BP cuff at home and has noted similar BP readings.Otherwise she feels quite well as diarrhea is gone and she has gained weight on Megace.She has a fu apt with K.lawrence NP 08/08/13 here in [facility]

## 2013-08-02 NOTE — Telephone Encounter (Signed)
I would recomm that she restart amlodipine 5 mg  She was on that in early Feb

## 2013-08-05 NOTE — Telephone Encounter (Signed)
Pt notified that Dr.Ross want's her to restart amlodipine 5 mg daily. Patient stated to me she had already restarted her BB and why does the doctor want something different.I told her that this was the advice from her doctor and she then agreed to go back on Amlodipine and stop BB.She has fu apt later this week with K.Lawrence NP and will discuss

## 2013-08-08 ENCOUNTER — Ambulatory Visit: Payer: Medicare Other | Admitting: Adult Health

## 2013-08-13 ENCOUNTER — Encounter: Payer: Self-pay | Admitting: *Deleted

## 2013-08-20 ENCOUNTER — Encounter: Payer: Self-pay | Admitting: *Deleted

## 2013-08-27 ENCOUNTER — Ambulatory Visit (INDEPENDENT_AMBULATORY_CARE_PROVIDER_SITE_OTHER): Payer: Medicare Other | Admitting: Obstetrics and Gynecology

## 2013-08-27 ENCOUNTER — Encounter: Payer: Self-pay | Admitting: Obstetrics & Gynecology

## 2013-08-27 VITALS — BP 178/80 | Ht 66.0 in | Wt 142.0 lb

## 2013-08-27 DIAGNOSIS — B0229 Other postherpetic nervous system involvement: Secondary | ICD-10-CM

## 2013-08-27 MED ORDER — GABAPENTIN 300 MG PO CAPS: 300.0000 mg | ORAL_CAPSULE | Freq: Three times a day (TID) | ORAL | Status: DC

## 2013-08-27 NOTE — Progress Notes (Signed)
Patient ID: Danielle [name], female   DOB: 10-05-1930, 80 y.o.   MRN: [id number]   [facility] Visit  Patient name: Danielle [name] MRN [id number]  Date of birth: [birth year]  CC & HPI:  Danielle [name] is a 80 y.o. female presenting today for f/u with shingles to the left chest. S/p 4 weeks acute shingles. Lesions have healed but she is having residual pain.  ROS:  +residual left chest pain from shingles Rash has healed No other complaints  Pertinent History Reviewed:  Medical & Surgical Hx:  Reviewed: Significant for HTN, A. Fib, LBBB Medications: Reviewed & Updated - see associated section Social History: Reviewed -  reports that she has never smoked. She has never used smokeless tobacco.  Objective Findings:  Vitals: BP 178/80  Ht 5\' 6"  (1.676 m)  Wt 142 lb (64.411 kg)  BMI 22.93 kg/m2  Physical Examination: General appearance - alert, well appearing, and in no distress, oriented to person, place, and time and normal appearing weight Mental status - alert, oriented to person, place, and time, normal mood, behavior, speech, dress, motor activity, and thought processes Chest - Extensive scar from shingles over posterior aspect of T8-12 on the left chest  Assessment & Plan:  A: 1. Residual shingle pain   P: 1. Neurontin  (gabepentin) 300 mg increase daily til [id number]/day.Refil x 3 mo

## 2013-08-27 NOTE — Patient Instructions (Signed)
If no better in 3 weeks, return for steroid therapy, or see dr Merlene Laughter., [facility]

## 2013-08-28 ENCOUNTER — Ambulatory Visit: Payer: Self-pay | Admitting: Adult Health

## 2013-09-10 ENCOUNTER — Ambulatory Visit (INDEPENDENT_AMBULATORY_CARE_PROVIDER_SITE_OTHER): Payer: Medicare Other | Admitting: Physician Assistant

## 2013-09-10 ENCOUNTER — Encounter: Payer: Self-pay | Admitting: Physician Assistant

## 2013-09-10 VITALS — BP 143/64 | HR 77 | Wt 146.0 lb

## 2013-09-10 DIAGNOSIS — R42 Dizziness and giddiness: Secondary | ICD-10-CM

## 2013-09-10 DIAGNOSIS — I10 Essential (primary) hypertension: Secondary | ICD-10-CM

## 2013-09-10 DIAGNOSIS — I4891 Unspecified atrial fibrillation: Secondary | ICD-10-CM

## 2013-09-10 MED ORDER — AMLODIPINE BESYLATE 5 MG PO TABS: 2.5000 mg | ORAL_TABLET | Freq: Every day | ORAL | Status: DC

## 2013-09-10 MED ORDER — METOPROLOL TARTRATE 25 MG PO TABS: 12.5000 mg | ORAL_TABLET | Freq: Two times a day (BID) | ORAL | Status: DC

## 2013-09-10 NOTE — Patient Instructions (Signed)
Your physician recommends that you schedule a follow-up appointment in: Dr Harrington Challenger in September  Your physician has recommended you make the following change in your medication:   Start Metoprolol 12.5 mg daily  Decreased Norvasc to 2.5 mg daily  Please bring Blood pressure readings in office in two weeks.

## 2013-09-10 NOTE — Assessment & Plan Note (Signed)
Patient is only dizzy if she gets up quickly from a sitting to standing position. She's had no syncope. She controls it she does not rush. To watch closely once metoprolol was added back. Have decreased Norvasc.

## 2013-09-10 NOTE — Assessment & Plan Note (Signed)
Patient seems to be in normal sinus rhythm on exam. No EKG done at patient's request. I will resume metoprolol 12.5 mg once daily. The patient feels better when she is on this. Decrease Norvasc to 2.5 mg daily. She is to call as with blood pressure readings in 2 weeks. Followup with Dr. Harrington Challenger in September.

## 2013-09-10 NOTE — Progress Notes (Signed)
HPI: This is an 80 year old female patient of Dr. Dorris Carnes who has history of paroxysmal atrial fibrillation bu  has maintained sinus rhythm, left bundle branch, history of dizziness after major weight loss secondary to C. difficile. She had hospitalization in February with diverticulitis and had an elevated troponin and new left bundle branch block. 2-D echo showed EF of 40-45% with septal hypokinesis. Stress Myoview was planned for when the patient improved but then she developed C. difficile and it was never done. She last saw Dr. Harrington Challenger in March 2015 at which time she was hypotensive due to the weight loss and my of appetite. Her metoprolol was stopped and eventually her Norvasc resumed.  The patient comes in today for routine checkup. She says she misses taking the metoprolol. She says her heart rate gets up in the 90s sometimes and she likes it better in the 60s. She occasionally has dizziness but only if she gets up quickly from a sitting to a standing position. She denies syncope. She denies any chest pain or shortness of breath. She is recovering from shingles that she developed in March. She is requesting that we don't do an EKG because of all the lesions on her chest.  Allergies -- Thyroid Hormones    --  proparathyroid thyroid medication  Current Outpatient Prescriptions on File Prior to Visit: amLODipine (NORVASC) 5 MG tablet, Take 5 mg by mouth., Disp: , Rfl:  diazepam (VALIUM) 5 MG tablet, Take 2.5 mg by mouth 2 (two) times daily as needed for anxiety., Disp: , Rfl:  dicyclomine (BENTYL) 10 MG capsule, Take 1 capsule (10 mg total) by mouth 4 (four) times daily -  before meals and at bedtime., Disp: 60 capsule, Rfl: 0 HYDROcodone-acetaminophen (NORCO/VICODIN) 5-325 MG per tablet, Take 1 tablet by mouth every 6 (six) hours as needed for moderate pain., Disp: , Rfl:  levothyroxine (SYNTHROID, LEVOTHROID) 100 MCG tablet, Take 100 mcg by mouth daily. Do not take synthroid on Mon and Thursdays,  Disp: , Rfl:  megestrol (MEGACE) 400 MG/10ML suspension, Take 10 mLs (400 mg total) by mouth daily., Disp: 240 mL, Rfl: 1 ondansetron (ZOFRAN) 4 MG tablet, Take 1 tablet (4 mg total) by mouth every 8 (eight) hours as needed for nausea or vomiting., Disp: 30 tablet, Rfl: 0 oxyCODONE-acetaminophen (PERCOCET) 7.5-325 MG per tablet, Take 1 tablet by mouth every 4 (four) hours as needed for pain., Disp: , Rfl:  potassium chloride 20 MEQ TBCR, Take 20 mEq by mouth daily., Disp: 10 tablet, Rfl: 0 Probiotic Product (ALIGN PO), Take 1 tablet by mouth daily., Disp: , Rfl:  promethazine (PHENERGAN) 25 MG tablet, Take 25 mg by mouth every 6 (six) hours as needed for nausea or vomiting., Disp: , Rfl:  ranitidine (ZANTAC) 150 MG capsule, Take 150 mg by mouth 2 (two) times daily., Disp: , Rfl:  saccharomyces boulardii (FLORASTOR) 250 MG capsule, Take 1 capsule (250 mg total) by mouth 2 (two) times daily., Disp: 30 capsule, Rfl: 1  No current facility-administered medications on file prior to visit.   Past Medical History:   Osteoarthritis                                               Macular degeneration  Hypertension                                                 Thyroid disease                                              Thyroid cancer                                               GI bleed                                                     Atrial fibrillation                                          C. difficile colitis                                         Diverticulitis                                               Shingles                                                     Lupus                                                       Past Surgical History:   THYROIDECTOMY                                                 COLONOSCOPY                                      03/17/2011      Comment:Procedure: COLONOSCOPY;  Surgeon: Rogene Houston,  MD;  Location: [facility];  Service:              Endoscopy;  Laterality: N/A;   CYST REMOVAL NECK  Review of patient's family history indicates:   Tuberculosis                   Mother                   Thrombosis                     Father                   Social History   Marital Status: Married             Spouse Name:                      Years of Education: Post HS         Number of children: 2           Occupational History Occupation          Fish farm manager            Comment              retired                                 worked in Insurance underwriter                                          for hospitals prior                                          to retirement   Social History Main Topics   Smoking Status: Never Smoker                     Smokeless Status: Never Used                       Alcohol Use: No             Drug Use: No             Sexual Activity: No                 Other Topics            Concern   None on file  Social History Narrative   Lives with her husband, active.    ROS: See history of present illness otherwise negative   PHYSICAL EXAM: Well-nournished, in no acute distress. Neck: No JVD, HJR, Bruit, or thyroid enlargement  Lungs: No tachypnea, clear without wheezing, rales, or rhonchi  Cardiovascular: RRR, PMI not displaced, heart sounds normal, no murmurs, gallops, bruit, thrill, or heave.  Abdomen: BS normal. Soft without organomegaly, masses, lesions or tenderness.  Extremities: without cyanosis, clubbing or edema. Good distal pulses bilateral  SKin: Warm, no lesions or rashes   Musculoskeletal: No deformities  Neuro: no focal signs  BP 143/64  Pulse 77  Wt 146 lb (66.225 kg)   EKG: Patient requested not to have an EKG because of her shingles

## 2013-09-10 NOTE — Assessment & Plan Note (Signed)
Patient's blood pressure is up today. I will resume metoprolol 12.5 mg once daily. The patient feels better when she is on this. Decrease Norvasc to 2.5 mg daily. She is to call as with blood pressure readings in 2 weeks. Followup with Dr. Harrington Challenger in September.

## 2013-09-13 ENCOUNTER — Encounter (HOSPITAL_COMMUNITY): Payer: Self-pay | Admitting: Emergency Medicine

## 2013-09-13 ENCOUNTER — Emergency Department (HOSPITAL_COMMUNITY): Payer: Medicare Other

## 2013-09-13 ENCOUNTER — Inpatient Hospital Stay (HOSPITAL_COMMUNITY)
Admission: EM | Admit: 2013-09-13 | Discharge: 2013-09-16 | DRG: 814 | Disposition: A | Discharge status: Home Health | Payer: Medicare Other | Attending: Family Medicine | Admitting: Family Medicine

## 2013-09-13 DIAGNOSIS — G589 Mononeuropathy, unspecified: Secondary | ICD-10-CM | POA: Diagnosis present

## 2013-09-13 DIAGNOSIS — E43 Unspecified severe protein-calorie malnutrition: Secondary | ICD-10-CM

## 2013-09-13 DIAGNOSIS — R197 Diarrhea, unspecified: Secondary | ICD-10-CM

## 2013-09-13 DIAGNOSIS — I447 Left bundle-branch block, unspecified: Secondary | ICD-10-CM | POA: Diagnosis present

## 2013-09-13 DIAGNOSIS — R109 Unspecified abdominal pain: Secondary | ICD-10-CM

## 2013-09-13 DIAGNOSIS — Z8619 Personal history of other infectious and parasitic diseases: Secondary | ICD-10-CM

## 2013-09-13 DIAGNOSIS — E039 Hypothyroidism, unspecified: Secondary | ICD-10-CM | POA: Diagnosis present

## 2013-09-13 DIAGNOSIS — D7389 Other diseases of spleen: Principal | ICD-10-CM

## 2013-09-13 DIAGNOSIS — G8929 Other chronic pain: Secondary | ICD-10-CM | POA: Diagnosis present

## 2013-09-13 DIAGNOSIS — Z85850 Personal history of malignant neoplasm of thyroid: Secondary | ICD-10-CM

## 2013-09-13 DIAGNOSIS — R7989 Other specified abnormal findings of blood chemistry: Secondary | ICD-10-CM

## 2013-09-13 DIAGNOSIS — I4891 Unspecified atrial fibrillation: Secondary | ICD-10-CM

## 2013-09-13 DIAGNOSIS — Z7901 Long term (current) use of anticoagulants: Secondary | ICD-10-CM

## 2013-09-13 DIAGNOSIS — E079 Disorder of thyroid, unspecified: Secondary | ICD-10-CM

## 2013-09-13 DIAGNOSIS — M1990 Unspecified osteoarthritis, unspecified site: Secondary | ICD-10-CM

## 2013-09-13 DIAGNOSIS — R8271 Bacteriuria: Secondary | ICD-10-CM

## 2013-09-13 DIAGNOSIS — D735 Infarction of spleen: Secondary | ICD-10-CM

## 2013-09-13 DIAGNOSIS — A419 Sepsis, unspecified organism: Secondary | ICD-10-CM

## 2013-09-13 DIAGNOSIS — A0472 Enterocolitis due to Clostridium difficile, not specified as recurrent: Secondary | ICD-10-CM

## 2013-09-13 DIAGNOSIS — I10 Essential (primary) hypertension: Secondary | ICD-10-CM

## 2013-09-13 DIAGNOSIS — E876 Hypokalemia: Secondary | ICD-10-CM

## 2013-09-13 DIAGNOSIS — Z79899 Other long term (current) drug therapy: Secondary | ICD-10-CM

## 2013-09-13 DIAGNOSIS — N390 Urinary tract infection, site not specified: Secondary | ICD-10-CM

## 2013-09-13 DIAGNOSIS — R42 Dizziness and giddiness: Secondary | ICD-10-CM

## 2013-09-13 DIAGNOSIS — K5732 Diverticulitis of large intestine without perforation or abscess without bleeding: Secondary | ICD-10-CM

## 2013-09-13 DIAGNOSIS — K5790 Diverticulosis of intestine, part unspecified, without perforation or abscess without bleeding: Secondary | ICD-10-CM

## 2013-09-13 DIAGNOSIS — R778 Other specified abnormalities of plasma proteins: Secondary | ICD-10-CM

## 2013-09-13 DIAGNOSIS — I509 Heart failure, unspecified: Secondary | ICD-10-CM

## 2013-09-13 DIAGNOSIS — H3530 Unspecified macular degeneration: Secondary | ICD-10-CM | POA: Diagnosis present

## 2013-09-13 DIAGNOSIS — M329 Systemic lupus erythematosus, unspecified: Secondary | ICD-10-CM | POA: Diagnosis present

## 2013-09-13 DIAGNOSIS — R531 Weakness: Secondary | ICD-10-CM

## 2013-09-13 LAB — CBC WITH DIFFERENTIAL/PLATELET
Basophils Absolute: 0 10*3/uL (ref 0.0–0.1)
Basophils Relative: 0 % (ref 0–1)
Eosinophils Absolute: 0.1 10*3/uL (ref 0.0–0.7)
Eosinophils Relative: 0 % (ref 0–5)
HCT: 41.4 % (ref 36.0–46.0)
Hemoglobin: 13.8 g/dL (ref 12.0–15.0)
Lymphocytes Relative: 11 % — ABNORMAL LOW (ref 12–46)
Lymphs Abs: 1.5 10*3/uL (ref 0.7–4.0)
MCH: 29.6 pg (ref 26.0–34.0)
MCHC: 33.3 g/dL (ref 30.0–36.0)
MCV: 88.8 fL (ref 78.0–100.0)
Monocytes Absolute: 1.7 10*3/uL — ABNORMAL HIGH (ref 0.1–1.0)
Monocytes Relative: 12 % (ref 3–12)
Neutro Abs: 11 10*3/uL — ABNORMAL HIGH (ref 1.7–7.7)
Neutrophils Relative %: 77 % (ref 43–77)
Platelets: 300 10*3/uL (ref 150–400)
RBC: 4.66 MIL/uL (ref 3.87–5.11)
RDW: 14.9 % (ref 11.5–15.5)
WBC: 14.3 10*3/uL — ABNORMAL HIGH (ref 4.0–10.5)

## 2013-09-13 LAB — URINE MICROSCOPIC-ADD ON

## 2013-09-13 LAB — I-STAT TROPONIN, ED: Troponin i, poc: 0.01 ng/mL (ref 0.00–0.08)

## 2013-09-13 LAB — BASIC METABOLIC PANEL
BUN: 12 mg/dL (ref 6–23)
CO2: 25 mEq/L (ref 19–32)
Calcium: 8.6 mg/dL (ref 8.4–10.5)
Chloride: 98 mEq/L (ref 96–112)
Creatinine, Ser: 0.73 mg/dL (ref 0.50–1.10)
GFR calc Af Amer: >90 mL/min (ref >90)
GFR calc non Af Amer: 79 mL/min — ABNORMAL LOW (ref >90)
Glucose, Bld: 126 mg/dL — ABNORMAL HIGH (ref 70–99)
Potassium: 3.3 mEq/L — ABNORMAL LOW (ref 3.7–5.3)
Sodium: 137 mEq/L (ref 137–147)

## 2013-09-13 LAB — URINALYSIS, ROUTINE W REFLEX MICROSCOPIC
Bilirubin Urine: NEGATIVE
Glucose, UA: NEGATIVE mg/dL
Hgb urine dipstick: NEGATIVE
Ketones, ur: 15 mg/dL — AB
Nitrite: POSITIVE — AB
Protein, ur: NEGATIVE mg/dL
Specific Gravity, Urine: 1.025 (ref 1.005–1.030)
Urobilinogen, UA: 2 mg/dL — ABNORMAL HIGH (ref 0.0–1.0)
pH: 6 (ref 5.0–8.0)

## 2013-09-13 LAB — LACTATE DEHYDROGENASE: LDH: 482 U/L — AB (ref 94–250)

## 2013-09-13 LAB — PROTIME-INR
INR: 1.07 (ref 0.00–1.49)
PROTHROMBIN TIME: 13.7 s (ref 11.6–15.2)

## 2013-09-13 MED ORDER — POTASSIUM CHLORIDE CRYS ER 20 MEQ PO TBCR
40.0000 meq | EXTENDED_RELEASE_TABLET | Freq: Once | ORAL | Status: AC
  Administered 2013-09-13: 40 meq via ORAL
  Filled 2013-09-13: qty 2

## 2013-09-13 MED ORDER — IOHEXOL 300 MG/ML  SOLN: 50.0000 mL | Freq: Once | INTRAMUSCULAR | Status: AC | PRN

## 2013-09-13 MED ORDER — LISINOPRIL 10 MG PO TABS
10.0000 mg | ORAL_TABLET | Freq: Every day | ORAL | Status: DC
  Administered 2013-09-13 – 2013-09-16 (×4): 10 mg via ORAL
  Filled 2013-09-13 (×4): qty 1

## 2013-09-13 MED ORDER — ACETAMINOPHEN 650 MG RE SUPP: 650.0000 mg | Freq: Four times a day (QID) | RECTAL | Status: DC | PRN

## 2013-09-13 MED ORDER — SODIUM CHLORIDE 0.9 % IV SOLN
INTRAVENOUS | Status: DC
  Administered 2013-09-13 – 2013-09-16 (×4): via INTRAVENOUS

## 2013-09-13 MED ORDER — SACCHAROMYCES BOULARDII 250 MG PO CAPS
250.0000 mg | ORAL_CAPSULE | Freq: Two times a day (BID) | ORAL | Status: DC
  Administered 2013-09-13 – 2013-09-16 (×6): 250 mg via ORAL
  Filled 2013-09-13 (×10): qty 1

## 2013-09-13 MED ORDER — MORPHINE SULFATE 2 MG/ML IJ SOLN
2.0000 mg | INTRAMUSCULAR | Status: DC | PRN
  Administered 2013-09-14: 2 mg via INTRAVENOUS
  Filled 2013-09-13 (×2): qty 1

## 2013-09-13 MED ORDER — POTASSIUM CHLORIDE CRYS ER 10 MEQ PO TBCR
20.0000 meq | EXTENDED_RELEASE_TABLET | Freq: Every day | ORAL | Status: DC
  Administered 2013-09-14 – 2013-09-16 (×3): 20 meq via ORAL
  Filled 2013-09-13 (×6): qty 2

## 2013-09-13 MED ORDER — IOHEXOL 300 MG/ML  SOLN
100.0000 mL | Freq: Once | INTRAMUSCULAR | Status: AC | PRN
  Administered 2013-09-13: 100 mL via INTRAVENOUS

## 2013-09-13 MED ORDER — ALIGN PO CAPS: 1.0000 | ORAL_CAPSULE | Freq: Every day | ORAL | Status: DC

## 2013-09-13 MED ORDER — OXYCODONE HCL 5 MG PO TABS
2.5000 mg | ORAL_TABLET | ORAL | Status: DC | PRN
  Administered 2013-09-13 – 2013-09-16 (×8): 2.5 mg via ORAL
  Filled 2013-09-13 (×8): qty 1

## 2013-09-13 MED ORDER — ENOXAPARIN SODIUM 80 MG/0.8ML ~~LOC~~ SOLN
1.0000 mg/kg | Freq: Once | SUBCUTANEOUS | Status: AC
  Administered 2013-09-13: 65 mg via SUBCUTANEOUS
  Filled 2013-09-13: qty 0.8

## 2013-09-13 MED ORDER — PROMETHAZINE HCL 12.5 MG PO TABS: 25.0000 mg | ORAL_TABLET | Freq: Four times a day (QID) | ORAL | Status: DC | PRN

## 2013-09-13 MED ORDER — ACETAMINOPHEN 325 MG PO TABS
650.0000 mg | ORAL_TABLET | Freq: Once | ORAL | Status: AC
  Administered 2013-09-13: 650 mg via ORAL
  Filled 2013-09-13: qty 2

## 2013-09-13 MED ORDER — HEPARIN (PORCINE) IN NACL 100-0.45 UNIT/ML-% IJ SOLN
1900.0000 [IU]/h | INTRAMUSCULAR | Status: AC
  Administered 2013-09-14: 900 [IU]/h via INTRAVENOUS
  Administered 2013-09-15: 1700 [IU]/h via INTRAVENOUS
  Administered 2013-09-15: 1300 [IU]/h via INTRAVENOUS
  Administered 2013-09-16: 1900 [IU]/h via INTRAVENOUS
  Filled 2013-09-13 (×5): qty 250

## 2013-09-13 MED ORDER — CAPSAICIN 0.025 % EX CREA
TOPICAL_CREAM | Freq: Two times a day (BID) | CUTANEOUS | Status: DC
  Administered 2013-09-14: 13:00:00 via TOPICAL
  Administered 2013-09-15: 1 via TOPICAL
  Filled 2013-09-13 (×2): qty 56.6

## 2013-09-13 MED ORDER — DIAZEPAM 5 MG PO TABS
2.5000 mg | ORAL_TABLET | Freq: Two times a day (BID) | ORAL | Status: DC | PRN
  Administered 2013-09-13 – 2013-09-16 (×2): 2.5 mg via ORAL
  Filled 2013-09-13 (×2): qty 1

## 2013-09-13 MED ORDER — DEXTROSE 5 % IV SOLN
1.0000 g | INTRAVENOUS | Status: DC
  Administered 2013-09-14 – 2013-09-15 (×2): 1 g via INTRAVENOUS
  Filled 2013-09-13 (×3): qty 10

## 2013-09-13 MED ORDER — PREGABALIN 75 MG PO CAPS
300.0000 mg | ORAL_CAPSULE | Freq: Every day | ORAL | Status: DC
  Administered 2013-09-13 – 2013-09-16 (×4): 300 mg via ORAL
  Filled 2013-09-13 (×5): qty 4

## 2013-09-13 MED ORDER — MEGESTROL ACETATE 400 MG/10ML PO SUSP
400.0000 mg | Freq: Every day | ORAL | Status: DC
  Administered 2013-09-13 – 2013-09-16 (×4): 400 mg via ORAL
  Filled 2013-09-13 (×7): qty 10

## 2013-09-13 MED ORDER — ACETAMINOPHEN 325 MG PO TABS: 650.0000 mg | ORAL_TABLET | Freq: Four times a day (QID) | ORAL | Status: DC | PRN

## 2013-09-13 MED ORDER — DEXTROSE 5 % IV SOLN
1.0000 g | Freq: Once | INTRAVENOUS | Status: AC
  Administered 2013-09-13: 1 g via INTRAVENOUS
  Filled 2013-09-13: qty 10

## 2013-09-13 MED ORDER — FAMOTIDINE 20 MG PO TABS
10.0000 mg | ORAL_TABLET | Freq: Two times a day (BID) | ORAL | Status: DC
  Administered 2013-09-13 – 2013-09-16 (×6): 10 mg via ORAL
  Filled 2013-09-13 (×6): qty 1

## 2013-09-13 MED ORDER — FENTANYL CITRATE 0.05 MG/ML IJ SOLN
50.0000 ug | Freq: Once | INTRAMUSCULAR | Status: AC
  Administered 2013-09-13: 50 ug via INTRAVENOUS
  Filled 2013-09-13: qty 2

## 2013-09-13 MED ORDER — ONDANSETRON HCL 4 MG/2ML IJ SOLN: 4.0000 mg | Freq: Four times a day (QID) | INTRAMUSCULAR | Status: DC | PRN

## 2013-09-13 MED ORDER — OXYCODONE-ACETAMINOPHEN 7.5-325 MG PO TABS: 1.0000 | ORAL_TABLET | ORAL | Status: DC | PRN

## 2013-09-13 MED ORDER — ONDANSETRON HCL 4 MG PO TABS: 4.0000 mg | ORAL_TABLET | Freq: Four times a day (QID) | ORAL | Status: DC | PRN

## 2013-09-13 MED ORDER — SODIUM CHLORIDE 0.9 % IJ SOLN
3.0000 mL | Freq: Two times a day (BID) | INTRAMUSCULAR | Status: DC
  Administered 2013-09-14 – 2013-09-15 (×3): 3 mL via INTRAVENOUS

## 2013-09-13 MED ORDER — OXYCODONE-ACETAMINOPHEN 5-325 MG PO TABS
1.0000 | ORAL_TABLET | ORAL | Status: DC | PRN
  Administered 2013-09-13 – 2013-09-16 (×10): 1 via ORAL
  Filled 2013-09-13 (×10): qty 1

## 2013-09-13 MED ORDER — METOPROLOL TARTRATE 25 MG PO TABS
12.5000 mg | ORAL_TABLET | Freq: Two times a day (BID) | ORAL | Status: AC
  Administered 2013-09-13 – 2013-09-15 (×5): 12.5 mg via ORAL
  Filled 2013-09-13 (×5): qty 1

## 2013-09-13 MED ORDER — DICYCLOMINE HCL 10 MG PO CAPS
10.0000 mg | ORAL_CAPSULE | Freq: Three times a day (TID) | ORAL | Status: DC
  Administered 2013-09-13 – 2013-09-16 (×10): 10 mg via ORAL
  Filled 2013-09-13 (×12): qty 1

## 2013-09-13 NOTE — Progress Notes (Addendum)
ANTICOAGULATION CONSULT NOTE - Initial Consult  Pharmacy Consult for Heparin Indication: hx of afib. splenic infarction.  Allergies  Allergen Reactions  . Thyroid Hormones     proparathyroid thyroid medication    Patient Measurements: Height: 5\' 6"  (167.6 cm) Weight: 142 lb (64.411 kg) IBW/kg (Calculated) : 59.3 Heparin Weight = 64.4 KG  Vital Signs: Temp: 98.6 F (37 C) (05/09 1632) Temp src: Oral (05/09 1632) BP: 152/59 mmHg (05/09 1814) Pulse Rate: 88 (05/09 1814)  Labs:  Recent Labs  09/13/13 1316 09/13/13 1635  HGB 13.8  --   HCT 41.4  --   PLT 300  --   LABPROT  --  13.7  INR  --  1.07  CREATININE 0.73  --     Estimated Creatinine Clearance: 52.5 ml/min (by C-G formula based on Cr of 0.73).   Medical History: Past Medical History  Diagnosis Date  . Osteoarthritis   . Macular degeneration   . Hypertension   . Thyroid disease   . Thyroid cancer   . GI bleed   . Atrial fibrillation   . C. difficile colitis   . Diverticulitis   . Shingles   . Lupus     Medications:   (Not in a hospital admission) Home meds reviewed  Assessment: Okay for Protocol , baseline WNL and admission H&P reviewed.  Patient received treatment dose of Lovenox X 1, not to be switched to IV Heparin  Goal of Therapy:  Heparin Level 0.3 - 0.7 Monitor platelets by anticoagulation protocol: Yes   Plan:  Begin Heparin roughly 9 hours after Lovenox Dose. No Bolus and an Infusion @ 900 units/hr Heparin Level 8 hours after start, then daily. CBC per protocol.  [name] 09/13/2013,6:22 PM

## 2013-09-13 NOTE — ED Notes (Signed)
UA obtained. Pt drinking contrast. Pt ambulated to bathroom without difficulty

## 2013-09-13 NOTE — ED Notes (Signed)
Complain of abdominal pain that started last night. States she recently has shingles

## 2013-09-13 NOTE — H&P (Addendum)
[facility] History and Physical  Danielle [name] EVO:[id number] DOB: 01-Feb-1931 DOA: 09/13/2013  Referring physician: Dr. Wilson Singer PCP: [name], MD   Chief Complaint:  Abdominal pain since one day   HPI:  80 year old female with history of hypothyroidism, thyroid cancer, hospitalization few months back for diverticulitis, C. difficile colitis with elevated troponin and new onset A. fib , history of shingles and? Lupus who presented to the ED with acute onset of abdominal pain since yesterday evening. Patient reports that the pain started on the bilateral hips and radiated to the back however her husband present at bedside informs that patient complained of pain over her left upper quadrant. She appears slightly confused last night as well. Patient reports that the pain over her left upper quadrant was possibly from the shingles she had underneath her left breast for possible weeks. She describes the pain to be stabbing without any aggravating or relieving factors. She denies similar symptoms in the past. She denies any nausea or vomiting. She denies any diarrhea or dysuria. She denies any fever or chills. Denies any headache, dizziness, chest pain, palpitations, shortness of breath.   Course in the ED Patient was febrile to 100.24F, tachycardic to 110s, tachypneic upper 30s with normal blood pressure. Blood work done showed a WBC of 14.2 thousand with normal hemoglobin and platelets. Chemistry showed potassium of 3.3. A chest x-ray done was unremarkable. EKG done showed sinus tachycardia with multiple PVCs and ordered a bundle branch block. UA was positive for UTI. Given severe abdominal pain a CT scan of the abdomen and pelvis was done which showed of 2 splenic infarct and chronic severe sigmoid diverticulosis without diverticulitis. Patient given a dose of IV fentanyl and IV Rocephin in the ED. Vascular surgery was consulted by ED physician given the splenic infarct and  recommended conservative management with pain control and anticoagulation. Patient was given a therapeutic dose of subcutaneous Lovenox.  Hospitalists called for admission to telemetry. On my evaluation patient denied any abdominal pain as she had already received pain medications.  Review of Systems:  Constitutional: Denies fever, chills, diaphoresis, appetite change and fatigue.  HEENT: Denies photophobia, eye pain,  ear pain, congestion, sore throat, rhinorrhea, sneezing, mouth sores, trouble swallowing, neck pain,  Respiratory: Denies SOB, DOE, cough, chest tightness,  and wheezing.   Cardiovascular: Denies chest pain, palpitations and leg swelling.  Gastrointestinal: Abdominal pain Denies nausea, vomiting,diarrhea, constipation, blood in stool and abdominal distention.  Genitourinary: Denies dysuria, urgency, frequency, hematuria, flank pain and difficulty urinating.  Endocrine: Denies: hot or cold intolerance,  polyuria, polydipsia. Musculoskeletal: Denies myalgias, back pain, joint swelling, arthralgias and gait problem.  Skin: Denies pallor, rash and wound.  Neurological: Denies dizziness, seizures, syncope, weakness, light-headedness, numbness and headaches.  Psychiatric/Behavioral: Denies  confusion, nervousness, sleep disturbance and agitation   Past Medical History  Diagnosis Date  . Osteoarthritis   . Macular degeneration   . Hypertension   . Thyroid disease   . Thyroid cancer   . GI bleed   . Atrial fibrillation   . C. difficile colitis   . Diverticulitis   . Shingles   . Lupus    Past Surgical History  Procedure Laterality Date  . Thyroidectomy    . Colonoscopy  03/17/2011    Procedure: COLONOSCOPY;  Surgeon: Rogene Houston, MD;  Location: [facility];  Service: Endoscopy;  Laterality: N/A;  . Cyst removal neck     Social History:  reports that she has never smoked. She  has never used smokeless tobacco. She reports that she does not drink alcohol or use  illicit drugs.  Allergies  Allergen Reactions  . Thyroid Hormones     proparathyroid thyroid medication    Family History  Problem Relation Age of Onset  . Tuberculosis Mother   . Thrombosis Father     Prior to Admission medications   Medication Sig Start Date End Date Taking? Authorizing Provider  amLODipine (NORVASC) 5 MG tablet Take 0.5 tablets (2.5 mg total) by mouth daily. 09/10/13  Yes Imogene Burn, PA-C  diazepam (VALIUM) 5 MG tablet Take 2.5 mg by mouth 2 (two) times daily as needed for anxiety.   Yes Historical Provider, MD  dicyclomine (BENTYL) 10 MG capsule Take 1 capsule (10 mg total) by mouth 4 (four) times daily -  before meals and at bedtime. 06/26/13  Yes Joni Reining, DO  levothyroxine (SYNTHROID, LEVOTHROID) 100 MCG tablet Take 100 mcg by mouth daily. Do not take synthroid on Mon and Thursdays   Yes Historical Provider, MD  megestrol (MEGACE) 400 MG/10ML suspension Take 10 mLs (400 mg total) by mouth daily. 07/21/13  Yes Butch Penny, NP  metoprolol tartrate (LOPRESSOR) 25 MG tablet Take 0.5 tablets (12.5 mg total) by mouth 2 (two) times daily. 09/10/13  Yes Imogene Burn, PA-C  oxyCODONE-acetaminophen (PERCOCET) 7.5-325 MG per tablet Take 1 tablet by mouth every 4 (four) hours as needed for pain.   Yes Historical Provider, MD  potassium chloride 20 MEQ TBCR Take 20 mEq by mouth daily. 06/26/13  Yes Joni Reining, DO  pregabalin (LYRICA) 300 MG capsule Take 300 mg by mouth daily.   Yes Historical Provider, MD  Probiotic Product (ALIGN PO) Take 1 tablet by mouth daily.   Yes Historical Provider, MD  promethazine (PHENERGAN) 25 MG tablet Take 25 mg by mouth every 6 (six) hours as needed for nausea or vomiting.   Yes Historical Provider, MD  ranitidine (ZANTAC) 150 MG capsule Take 150 mg by mouth 2 (two) times daily.   Yes Historical Provider, MD  saccharomyces boulardii (FLORASTOR) 250 MG capsule Take 1 capsule (250 mg total) by mouth 2 (two) times daily. 07/04/13  Yes  [name], MD     Physical Exam:  Filed Vitals:   09/13/13 1400 09/13/13 1448 09/13/13 1500 09/13/13 1632  BP: 154/72 148/79 146/61 124/55  Pulse: 81 97 100 91  Temp:    98.6 F (37 C)  TempSrc:    Oral  Resp: 24 29 19 27   Height:      Weight:      SpO2: 88% 95% 97% 94%    Constitutional: Vital signs reviewed. Elderly female lying in bed in no acute distress. HEENT: no pallor, no icterus, moist oral mucosa,  Cardiovascular: S1 and S2 is irregularly irregular, no MRG Chest: CTAB, no wheezes, rales, or rhonchi Abdominal: Soft. Non-tender, non-distended, bowel sounds are normal, no masses, organomegaly, or guarding present.  GU: no CVA tenderness Ext: warm, no edema Neurological: A&O x3, non focal  Labs on Admission:  Basic Metabolic Panel:  Recent Labs Lab 09/13/13 1316  NA 137  K 3.3*  CL 98  CO2 25  GLUCOSE 126*  BUN 12  CREATININE 0.73  CALCIUM 8.6   Liver Function Tests: No results found for this basename: AST, ALT, ALKPHOS, BILITOT, PROT, ALBUMIN,  in the last 168 hours No results found for this basename: LIPASE, AMYLASE,  in the last 168 hours No results found for this basename:  AMMONIA,  in the last 168 hours CBC:  Recent Labs Lab 09/13/13 1316  WBC 14.3*  NEUTROABS 11.0*  HGB 13.8  HCT 41.4  MCV 88.8  PLT 300   Cardiac Enzymes: No results found for this basename: CKTOTAL, CKMB, CKMBINDEX, TROPONINI,  in the last 168 hours BNP: No components found with this basename: POCBNP,  CBG: No results found for this basename: GLUCAP,  in the last 168 hours  Radiological Exams on Admission: Ct Abdomen Pelvis W Contrast  09/13/2013   CLINICAL DATA:  Worsening abdominal pain radiating to low back.  EXAM: CT ABDOMEN AND PELVIS WITH CONTRAST  TECHNIQUE: Multidetector CT imaging of the abdomen and pelvis was performed using the standard protocol following bolus administration of intravenous contrast.  CONTRAST:  134mL OMNIPAQUE IOHEXOL 300 MG/ML  SOLN   COMPARISON:  06/22/2013  FINDINGS: Previous seen bilateral pleural effusions have resolved. Geographic area of decreased enhancement is seen within the spleen, which is consistent with acute splenic infarct. No evidence of splenomegaly, perisplenic hematoma, or hemoperitoneum.  Tiny left hepatic lobe cyst remains stable and no liver masses are identified. Gallbladder is unremarkable. The pancreas, adrenal glands, and right kidney are normal in appearance. A small left renal cysts again noted, however there is no evidence of renal masses or hydronephrosis.  Severe diverticulosis seen involving the sigmoid colon. Sigmoid colonic wall thickening remains stable with minimal stranding in the adjacent pericolonic fat. This may represent chronic muscular hypertrophy secondary to diverticulosis, although mild diverticulitis cannot be excluded. A soft tissue density with peripheral calcifications again seen along the posterior wall of the sigmoid colon which is stable and likely due to chronic inflammation/fat necrosis. No evidence of bowel obstruction, abscess, or extraluminal gas collection.  A small midline epigastric ventral hernia is again seen containing only omental fat. No evidence herniated bowel loops. Tiny uterine fibroids again noted. Adnexal regions are unremarkable in appearance.  IMPRESSION: Acute splenic infarct.  Stable appearance of severe sigmoid diverticular disease, with chronic muscular hypertrophy versus chronic diverticulitis. No evidence of abscess or other acute complication.  Stable small epigastric ventral hernia containing only fat and tiny uterine fibroids.   Electronically Signed   By: Earle Gell M.D.   On: 09/13/2013 16:04   Dg Chest Portable 1 View  09/13/2013   CLINICAL DATA:  Chest and left arm pain.  Back pain.  EXAM: PORTABLE CHEST - 1 VIEW  COMPARISON:  06/19/2013  FINDINGS: Moderate cardiomegaly stable. Diffuse interstitial edema has resolved since previous study. No definite pleural  effusion seen on this portable exam.  IMPRESSION: Stable cardiomegaly. Resolution of interstitial edema since prior exam. No acute findings.   Electronically Signed   By: Earle Gell M.D.   On: 09/13/2013 13:52    EKG: Sinus tachycardia at 105, left bundle branch block, multiple PVCs , no ST-T changes  Assessment/Plan  Principal Problem:   Acute Splenic infarct possible etiology is underlying Afib. Her medical history also Patient also has hx of thyroid ca, but no other hypercoagulable state. -Admit to telemetry. Serial abdominal exam. Pain control with when necessary Percocet and IV morphine. -Check LDH and lactic acid level. -ED physician discussed with vascular surgery Dr. Scot Dock who recommended either with management with pain control and anticoagulation. -Patient has a CHADS2 score of 3 and would benefit from anticoagulation. She has new A. fib with findings of CHF on recent echo but  was not started on it as her blood pressure was low. -Patient received a dose of  therapeutic Lovenox in the ED. Discussed with cardiology consult Dr Luiz Ochoa  over the phone. Agreed with anticoagulation but recommended IV heparin instead and monitor for 24-48 hrs and her Coumadin or your agent if stable.  Active Problems:   Abdominal pain Likely secondary to above. Patient also has shingles over her left lower breast which she had for past 6 weeks and reports being treated for it. She does not have active lesions at this time. I would continue her on pain medications and order capsaicin ointment.  patient had sigmoid colitis and c diff enteritis few month back. CT abd shows diverticulosis without diverticulitis. Also has UTI.     Hypertension Blood pressure stable. Continue low dose metoprolol . Will switch amlodipine to  lisinopril    Atrial fibrillation Recently diagnosed. Patient follows with Dr. Harrington Challenger.  Heart chads2 score is 3  ( age, HTN and CHF) but was not started on anticoagulation as her blood  pressure was low as outpatient. She is currently in sinus tachycardia with multiple PVCs on monitor. Discussed with cardiology and we'll start her on IV heparin. Transitioned to Coumadin or newer agent if stable over24-48 hours.    Protein-calorie malnutrition, severe Nutrition consult    UTI (urinary tract infection) Follow urine culture. On empiric rocephin    CHF (congestive heart failure) Recent echo with EF of 40-45% with hypokinesis and grade 1 diastolic dysfunction. Patient is currently euvolemic. We'll provide gentle hydration. Continue low dose metoprolol. She had elevated troponins on previous hospitalization and was thought to be related to acute abdominal symptoms. Cardiology plans on outpatient stress test. Given her low EF,  I will start her on lisinopril.    History of shingles Patient reports being treated as outpatient however has chronic pain beneath her left breast and back. We'll order capsicin ointment. Continue oxycodone for pain.    LBBB (left bundle branch block) Chronic.  Hypothyroidism Continue Synthroid  Hypokalemia Replenish  Diet:cardiac  DVT prophylaxis: IV heparin   Code Status: full code Family Communication: discussed with husband at bedside Disposition Plan; home once stable   Xaidyn Kepner [facility] Pager [phone]  Total time spent on admission :70 minutes  If 7PM-7AM, please contact night-coverage [email or web address] Password [email or web address] 09/13/2013, 5:17 PM

## 2013-09-13 NOTE — ED Provider Notes (Signed)
CSN: [id number]     Arrival date & time 09/13/13  1253 History  This chart was scribed for Virgel Manifold, MD by Einar Pheasant, ED Scribe. This patient was seen in room [facility]11[facility]11 and the patient's care was started at 1:33 PM.    Chief Complaint  Patient presents with  . Abdominal Pain   The history is provided by the patient. No language interpreter was used.   HPI Comments: Danielle [name] is a 80 y.o. female who presents to the Emergency Department complaining of worsening abdominal pain that started last night.  Pt states that the pain radiates around her lower back. She states that the pain has been pretty constant. She states that the pain is not made better or worse by anything. Pt states that she was recently diagnosed with shingles. She reports a prior episode of this type of pain, but she states the pain went away on its own. Husband states that last night pt got up in the middle of the night and seemed confused, saying things that didn't make sense. Denies any chest pain, rash, SOB, nausea, diarrhea or emesis.   Past Medical History  Diagnosis Date  . Osteoarthritis   . Macular degeneration   . Hypertension   . Thyroid disease   . Thyroid cancer   . GI bleed   . Atrial fibrillation   . C. difficile colitis   . Diverticulitis   . Shingles   . Lupus    Past Surgical History  Procedure Laterality Date  . Thyroidectomy    . Colonoscopy  03/17/2011    Procedure: COLONOSCOPY;  Surgeon: Rogene Houston, MD;  Location: [facility];  Service: Endoscopy;  Laterality: N/A;  . Cyst removal neck     Family History  Problem Relation Age of Onset  . Tuberculosis Mother   . Thrombosis Father    History  Substance Use Topics  . Smoking status: Never Smoker   . Smokeless tobacco: Never Used  . Alcohol Use: No   OB History   Grav Para Term Preterm Abortions TAB SAB Ect Mult Living                 Review of Systems  Gastrointestinal: Positive for abdominal pain. Negative  for nausea, vomiting and diarrhea.  Musculoskeletal: Positive for back pain.  Skin: Positive for rash.  All other systems reviewed and are negative.     Allergies  Thyroid hormones  Home Medications   Prior to Admission medications   Medication Sig Start Date End Date Taking? Authorizing Provider  amLODipine (NORVASC) 5 MG tablet Take 0.5 tablets (2.5 mg total) by mouth daily. 09/10/13   Imogene Burn, PA-C  diazepam (VALIUM) 5 MG tablet Take 2.5 mg by mouth 2 (two) times daily as needed for anxiety.    Historical Provider, MD  dicyclomine (BENTYL) 10 MG capsule Take 1 capsule (10 mg total) by mouth 4 (four) times daily -  before meals and at bedtime. 06/26/13   Joni Reining, DO  HYDROcodone-acetaminophen (NORCO/VICODIN) 5-325 MG per tablet Take 1 tablet by mouth every 6 (six) hours as needed for moderate pain.    Historical Provider, MD  levothyroxine (SYNTHROID, LEVOTHROID) 100 MCG tablet Take 100 mcg by mouth daily. Do not take synthroid on Mon and Thursdays    Historical Provider, MD  megestrol (MEGACE) 400 MG/10ML suspension Take 10 mLs (400 mg total) by mouth daily. 07/21/13   Butch Penny, NP  metoprolol tartrate (LOPRESSOR) 25 MG  tablet Take 0.5 tablets (12.5 mg total) by mouth 2 (two) times daily. 09/10/13   Imogene Burn, PA-C  ondansetron (ZOFRAN) 4 MG tablet Take 1 tablet (4 mg total) by mouth every 8 (eight) hours as needed for nausea or vomiting. 07/16/13   Butch Penny, NP  oxyCODONE-acetaminophen (PERCOCET) 7.5-325 MG per tablet Take 1 tablet by mouth every 4 (four) hours as needed for pain.    Historical Provider, MD  potassium chloride 20 MEQ TBCR Take 20 mEq by mouth daily. 06/26/13   Joni Reining, DO  pregabalin (LYRICA) 300 MG capsule Take 300 mg by mouth daily.    Historical Provider, MD  Probiotic Product (ALIGN PO) Take 1 tablet by mouth daily.    Historical Provider, MD  promethazine (PHENERGAN) 25 MG tablet Take 25 mg by mouth every 6 (six) hours as needed for  nausea or vomiting.    Historical Provider, MD  ranitidine (ZANTAC) 150 MG capsule Take 150 mg by mouth 2 (two) times daily.    Historical Provider, MD  saccharomyces boulardii (FLORASTOR) 250 MG capsule Take 1 capsule (250 mg total) by mouth 2 (two) times daily. 07/04/13   [name], MD   Pulse 112  Temp(Src) 100.2 F (37.9 C) (Oral)  Resp 32  Ht 5\' 6"  (1.676 m)  Wt 142 lb (64.411 kg)  BMI 22.93 kg/m2  SpO2 100%  Physical Exam  Nursing note and vitals reviewed. Constitutional: She appears well-developed and well-nourished. No distress.  HENT:  Head: Normocephalic and atraumatic.  Eyes: Conjunctivae are normal. Right eye exhibits no discharge. Left eye exhibits no discharge.  Neck: Neck supple.  Cardiovascular: Normal rate, regular rhythm and normal heart sounds.  Exam reveals no gallop and no friction rub.   No murmur heard. Pulmonary/Chest: Effort normal and breath sounds normal. No respiratory distress.  Abdominal: Soft. She exhibits no distension. There is tenderness (lower abdominal tenderness).  Small reducible umbillical hernia.   Musculoskeletal: She exhibits no edema and no tenderness.  Neurological: She is alert.  Skin: Skin is warm and dry.  Rash consitant with resolving shingles to the left T5/T6 dermatome.    Psychiatric: She has a normal mood and affect. Her behavior is normal. Thought content normal.    ED Course  Procedures (including critical care time)  DIAGNOSTIC STUDIES: Oxygen Saturation is 100% on RA, normal by my interpretation.    COORDINATION OF CARE:  1:41 PM-Will order pain medication and CT scan. Pt advised of plan for treatment and pt agrees.  2:44 PM- pt was rechecked. She was told about her diagnosis of a UTI. Pt will be admitted and started on some antibiotics.   Labs Review Labs Reviewed  CBC WITH DIFFERENTIAL - Abnormal; Notable for the following:    WBC 14.3 (*)    Neutro Abs 11.0 (*)    Lymphocytes Relative 11 (*)     Monocytes Absolute 1.7 (*)    All other components within normal limits  BASIC METABOLIC PANEL - Abnormal; Notable for the following:    Potassium 3.3 (*)    Glucose, Bld 126 (*)    GFR calc non Af Amer 79 (*)    All other components within normal limits  URINALYSIS, ROUTINE W REFLEX MICROSCOPIC - Abnormal; Notable for the following:    APPearance CLOUDY (*)    Ketones, ur 15 (*)    Urobilinogen, UA 2.0 (*)    Nitrite POSITIVE (*)    Leukocytes, UA TRACE (*)    All  other components within normal limits  URINE MICROSCOPIC-ADD ON - Abnormal; Notable for the following:    Bacteria, UA MANY (*)    All other components within normal limits  URINE CULTURE  I-STAT TROPOININ, ED    Imaging Review Ct Abdomen Pelvis W Contrast  09/13/2013   CLINICAL DATA:  Worsening abdominal pain radiating to low back.  EXAM: CT ABDOMEN AND PELVIS WITH CONTRAST  TECHNIQUE: Multidetector CT imaging of the abdomen and pelvis was performed using the standard protocol following bolus administration of intravenous contrast.  CONTRAST:  111mL OMNIPAQUE IOHEXOL 300 MG/ML  SOLN  COMPARISON:  06/22/2013  FINDINGS: Previous seen bilateral pleural effusions have resolved. Geographic area of decreased enhancement is seen within the spleen, which is consistent with acute splenic infarct. No evidence of splenomegaly, perisplenic hematoma, or hemoperitoneum.  Tiny left hepatic lobe cyst remains stable and no liver masses are identified. Gallbladder is unremarkable. The pancreas, adrenal glands, and right kidney are normal in appearance. A small left renal cysts again noted, however there is no evidence of renal masses or hydronephrosis.  Severe diverticulosis seen involving the sigmoid colon. Sigmoid colonic wall thickening remains stable with minimal stranding in the adjacent pericolonic fat. This may represent chronic muscular hypertrophy secondary to diverticulosis, although mild diverticulitis cannot be excluded. A soft tissue  density with peripheral calcifications again seen along the posterior wall of the sigmoid colon which is stable and likely due to chronic inflammation/fat necrosis. No evidence of bowel obstruction, abscess, or extraluminal gas collection.  A small midline epigastric ventral hernia is again seen containing only omental fat. No evidence herniated bowel loops. Tiny uterine fibroids again noted. Adnexal regions are unremarkable in appearance.  IMPRESSION: Acute splenic infarct.  Stable appearance of severe sigmoid diverticular disease, with chronic muscular hypertrophy versus chronic diverticulitis. No evidence of abscess or other acute complication.  Stable small epigastric ventral hernia containing only fat and tiny uterine fibroids.   Electronically Signed   By: Earle Gell M.D.   On: 09/13/2013 16:04   Dg Chest Portable 1 View  09/13/2013   CLINICAL DATA:  Chest and left arm pain.  Back pain.  EXAM: PORTABLE CHEST - 1 VIEW  COMPARISON:  06/19/2013  FINDINGS: Moderate cardiomegaly stable. Diffuse interstitial edema has resolved since previous study. No definite pleural effusion seen on this portable exam.  IMPRESSION: Stable cardiomegaly. Resolution of interstitial edema since prior exam. No acute findings.   Electronically Signed   By: Earle Gell M.D.   On: 09/13/2013 13:52     EKG Interpretation   Date/Time:  Saturday Sep 13 2013 13:02:05 EDT Ventricular Rate:  105 PR Interval:  146 QRS Duration: 138 QT Interval:  374 QTC Calculation: 494 R Axis:     Text Interpretation:  Sinus tachycardia with irregular rate Left bundle  branch block Non-specific ST-t changes Confirmed by Wilson Singer  MD, Susi Goslin  ([id number]) on 09/13/2013 1:08:01 PM      MDM   Final diagnoses:  Sepsis  UTI (urinary tract infection)  Infarction of spleen    82yF with confusion and lower abdominal pain. Most likely 2/2 UTI. UA consistent with this. Pt lives with husband at home and with fever and confusion I think admission  prudent until shows improvement on abx.   4:16 PM CT interestingly shows splenic infarct. Hx of afib. EKG sinus but monitor showing frequent PACs versus intermittent afib. Currently not anticoagulated. Pt has endorsed L side pain and back pain. Not greatest historian though and clinical  picture further clouded by recent L sided shingles in same general area.   Discussed with Dr Scot Dock, vascular surgery. No surgical intervention indicated at this time. Pain control/anticoagulation.   I personally preformed the services scribed in my presence. The recorded information has been reviewed is accurate. Virgel Manifold, MD.     Virgel Manifold, MD 09/13/13 [phone]

## 2013-09-13 NOTE — Plan of Care (Signed)
Problem: Consults Goal: General Medical Patient Education See Patient Education Module for specific education.  Outcome: Progressing Patient admitted with abdominal pain after CT showed splenic infarcts x2 and vascular surgery consulted in ED.  Pain management and anticoagulation recommended. Goal: Skin Care Protocol Initiated - if Braden Score 18 or less If consults are not indicated, leave blank or document N/A  Outcome: Progressing Patient states she is cold after given warm blanket states it is resolving Goal: Nutrition Consult-if indicated Outcome: Progressing States she has been eating lately  Problem: Phase I Progression Outcomes Goal: Pain controlled with appropriate interventions Outcome: Progressing Pain managed with medication Goal: OOB as tolerated unless otherwise ordered Outcome: Progressing Patient agrees to call if need to get oob Goal: Initial discharge plan identified Outcome: Danielle [name] with spouse

## 2013-09-14 LAB — BASIC METABOLIC PANEL
BUN: 13 mg/dL (ref 6–23)
CALCIUM: 8.3 mg/dL — AB (ref 8.4–10.5)
CO2: 25 meq/L (ref 19–32)
CREATININE: 0.85 mg/dL (ref 0.50–1.10)
Chloride: 105 mEq/L (ref 96–112)
GFR calc Af Amer: 73 mL/min — ABNORMAL LOW (ref >90)
GFR, EST NON AFRICAN AMERICAN: 63 mL/min — AB (ref >90)
Glucose, Bld: 91 mg/dL (ref 70–99)
Potassium: 3.7 mEq/L (ref 3.7–5.3)
Sodium: 142 mEq/L (ref 137–147)

## 2013-09-14 LAB — HEPARIN LEVEL (UNFRACTIONATED)
Heparin Unfractionated: <0.1 IU/mL — ABNORMAL LOW (ref 0.30–0.70)
Heparin Unfractionated: <0.1 IU/mL — ABNORMAL LOW (ref 0.30–0.70)

## 2013-09-14 LAB — CBC
HCT: 35.4 % — ABNORMAL LOW (ref 36.0–46.0)
Hemoglobin: 11.8 g/dL — ABNORMAL LOW (ref 12.0–15.0)
MCH: 30 pg (ref 26.0–34.0)
MCHC: 33.3 g/dL (ref 30.0–36.0)
MCV: 90.1 fL (ref 78.0–100.0)
PLATELETS: 276 10*3/uL (ref 150–400)
RBC: 3.93 MIL/uL (ref 3.87–5.11)
RDW: 15.1 % (ref 11.5–15.5)
WBC: 13.1 10*3/uL — AB (ref 4.0–10.5)

## 2013-09-14 NOTE — Progress Notes (Signed)
Danielle [name] for Heparin Indication: hx of afib. splenic infarction.  Allergies  Allergen Reactions  . Thyroid Hormones     proparathyroid thyroid medication   Patient Measurements: Height: 5\' 6"  (167.6 cm) Weight: 144 lb 6.4 oz (65.5 kg) IBW/kg (Calculated) : 59.3 Heparin Weight = 64.4 KG  Vital Signs: Temp: 98.7 F (37.1 C) (05/10 1458) Temp src: Oral (05/10 1458) BP: 99/53 mmHg (05/10 1458) Pulse Rate: 71 (05/10 1458)  Labs:  Recent Labs  09/13/13 1316 09/13/13 1635 09/14/13 0546 09/14/13 1007 09/14/13 1653  HGB 13.8  --  11.8*  --   --   HCT 41.4  --  35.4*  --   --   PLT 300  --  276  --   --   LABPROT  --  13.7  --   --   --   INR  --  1.07  --   --   --   HEPARINUNFRC  --   --   --  <0.10* <0.10*  CREATININE 0.73  --  0.85  --   --    Estimated Creatinine Clearance: 49.4 ml/min (by C-G formula based on Cr of 0.85).  Medical History: Past Medical History  Diagnosis Date  . Osteoarthritis   . Macular degeneration   . Hypertension   . Thyroid disease   . Thyroid cancer   . GI bleed   . Atrial fibrillation   . C. difficile colitis   . Diverticulitis   . Shingles   . Lupus    Medications:  Prescriptions prior to admission  Medication Sig Dispense Refill  . amLODipine (NORVASC) 5 MG tablet Take 0.5 tablets (2.5 mg total) by mouth daily.  30 tablet  6  . diazepam (VALIUM) 5 MG tablet Take 2.5 mg by mouth 2 (two) times daily as needed for anxiety.      . dicyclomine (BENTYL) 10 MG capsule Take 1 capsule (10 mg total) by mouth 4 (four) times daily -  before meals and at bedtime.  60 capsule  0  . levothyroxine (SYNTHROID, LEVOTHROID) 100 MCG tablet Take 100 mcg by mouth daily. Do not take synthroid on Mon and Thursdays      . megestrol (MEGACE) 400 MG/10ML suspension Take 10 mLs (400 mg total) by mouth daily.  240 mL  1  . metoprolol tartrate (LOPRESSOR) 25 MG tablet Take 0.5 tablets (12.5 mg total) by mouth 2 (two)  times daily.  30 tablet  6  . oxyCODONE-acetaminophen (PERCOCET) 7.5-325 MG per tablet Take 1 tablet by mouth every 4 (four) hours as needed for pain.      . potassium chloride 20 MEQ TBCR Take 20 mEq by mouth daily.  10 tablet  0  . pregabalin (LYRICA) 300 MG capsule Take 300 mg by mouth daily.      . Probiotic Product (ALIGN PO) Take 1 tablet by mouth daily.      . promethazine (PHENERGAN) 25 MG tablet Take 25 mg by mouth every 6 (six) hours as needed for nausea or vomiting.      . ranitidine (ZANTAC) 150 MG capsule Take 150 mg by mouth 2 (two) times daily.      Marland Kitchen saccharomyces boulardii (FLORASTOR) 250 MG capsule Take 1 capsule (250 mg total) by mouth 2 (two) times daily.  30 capsule  1   Home meds reviewed  Assessment: Okay for Protocol , heparin level below goal x 2.  Infusing properly per RN.  Goal of  Therapy:  Heparin Level 0.3 - 0.7 Monitor platelets by anticoagulation protocol: Yes   Plan:  Increase Heparin to 1300 units/hr Heparin Level 6-8 hours and continue daily. CBC per protocol.  Danielle [name] 09/14/2013,6:15 PM

## 2013-09-14 NOTE — Progress Notes (Addendum)
[place] for Heparin Indication: hx of afib. splenic infarction.  Allergies  Allergen Reactions  . Thyroid Hormones     proparathyroid thyroid medication   Patient Measurements: Height: 5\' 6"  (167.6 cm) Weight: 144 lb 6.4 oz (65.5 kg) IBW/kg (Calculated) : 59.3 Heparin Weight = 64.4 KG  Vital Signs: Temp: 98.6 F (37 C) (05/10 0524) Temp src: Oral (05/10 0524) BP: 103/47 mmHg (05/10 0524) Pulse Rate: 68 (05/10 0524)  Labs:  Recent Labs  09/13/13 1316 09/13/13 1635 09/14/13 0546 09/14/13 1007  HGB 13.8  --  11.8*  --   HCT 41.4  --  35.4*  --   PLT 300  --  276  --   LABPROT  --  13.7  --   --   INR  --  1.07  --   --   HEPARINUNFRC  --   --   --  <0.10*  CREATININE 0.73  --  0.85  --    Estimated Creatinine Clearance: 49.4 ml/min (by C-G formula based on Cr of 0.85).  Medical History: Past Medical History  Diagnosis Date  . Osteoarthritis   . Macular degeneration   . Hypertension   . Thyroid disease   . Thyroid cancer   . GI bleed   . Atrial fibrillation   . C. difficile colitis   . Diverticulitis   . Shingles   . Lupus    Medications:  Prescriptions prior to admission  Medication Sig Dispense Refill  . amLODipine (NORVASC) 5 MG tablet Take 0.5 tablets (2.5 mg total) by mouth daily.  30 tablet  6  . diazepam (VALIUM) 5 MG tablet Take 2.5 mg by mouth 2 (two) times daily as needed for anxiety.      . dicyclomine (BENTYL) 10 MG capsule Take 1 capsule (10 mg total) by mouth 4 (four) times daily -  before meals and at bedtime.  60 capsule  0  . levothyroxine (SYNTHROID, LEVOTHROID) 100 MCG tablet Take 100 mcg by mouth daily. Do not take synthroid on Mon and Thursdays      . megestrol (MEGACE) 400 MG/10ML suspension Take 10 mLs (400 mg total) by mouth daily.  240 mL  1  . metoprolol tartrate (LOPRESSOR) 25 MG tablet Take 0.5 tablets (12.5 mg total) by mouth 2 (two) times daily.  30 tablet  6  . oxyCODONE-acetaminophen  (PERCOCET) 7.5-325 MG per tablet Take 1 tablet by mouth every 4 (four) hours as needed for pain.      . potassium chloride 20 MEQ TBCR Take 20 mEq by mouth daily.  10 tablet  0  . pregabalin (LYRICA) 300 MG capsule Take 300 mg by mouth daily.      . Probiotic Product (ALIGN PO) Take 1 tablet by mouth daily.      . promethazine (PHENERGAN) 25 MG tablet Take 25 mg by mouth every 6 (six) hours as needed for nausea or vomiting.      . ranitidine (ZANTAC) 150 MG capsule Take 150 mg by mouth 2 (two) times daily.      Marland Kitchen saccharomyces boulardii (FLORASTOR) 250 MG capsule Take 1 capsule (250 mg total) by mouth 2 (two) times daily.  30 capsule  1   Home meds reviewed  Assessment: Okay for Protocol , heparin level below goal.  Goal of Therapy:  Heparin Level 0.3 - 0.7 Monitor platelets by anticoagulation protocol: Yes   Plan:  Increase Heparin to 1100 units/hr Heparin Level 6-8 hours and continue  daily. CBC per protocol.  [name] 09/14/2013,10:27 AM

## 2013-09-14 NOTE — Progress Notes (Signed)
Subjective: She was admitted with splenic infarction. This is probably related to atrial fibrillation. She is not complaining of any abdominal pain at this time. She is on heparin drip. She says she is hungry. She also has shingles across her left breast which is causing  pain  Objective: Vital signs in last 24 hours: Temp:  [98.6 F (37 C)-100.2 F (37.9 C)] 98.6 F (37 C) (05/10 0524) Pulse Rate:  [49-112] 68 (05/10 0524) Resp:  [19-35] 20 (05/10 0524) BP: (103-166)/(47-79) 103/47 mmHg (05/10 0524) SpO2:  [88 %-100 %] 96 % (05/10 0524) Weight:  [64.411 kg (142 lb)-65.5 kg (144 lb 6.4 oz)] 65.5 kg (144 lb 6.4 oz) (05/09 1904) Weight change:  Last BM Date: 09/13/13  Intake/Output from previous day: 05/09 0701 - 05/10 0700 In: 7.5 [I.V.:7.5] Out: 2 [Urine:2]  PHYSICAL EXAM General appearance: alert, cooperative and mild distress Resp: clear to auscultation bilaterally Cardio: regular rate and rhythm, S1, S2 normal, no murmur, click, rub or gallop GI: soft, non-tender; bowel sounds normal; no masses,  no organomegaly Extremities: extremities normal, atraumatic, no cyanosis or edema  Lab Results:    Basic Metabolic Panel:  Recent Labs  09/13/13 1316 09/14/13 0546  NA 137 142  K 3.3* 3.7  CL 98 105  CO2 25 25  GLUCOSE 126* 91  BUN 12 13  CREATININE 0.73 0.85  CALCIUM 8.6 8.3*   Liver Function Tests: No results found for this basename: AST, ALT, ALKPHOS, BILITOT, PROT, ALBUMIN,  in the last 72 hours No results found for this basename: LIPASE, AMYLASE,  in the last 72 hours No results found for this basename: AMMONIA,  in the last 72 hours CBC:  Recent Labs  09/13/13 1316 09/14/13 0546  WBC 14.3* 13.1*  NEUTROABS 11.0*  --   HGB 13.8 11.8*  HCT 41.4 35.4*  MCV 88.8 90.1  PLT 300 276   Cardiac Enzymes: No results found for this basename: CKTOTAL, CKMB, CKMBINDEX, TROPONINI,  in the last 72 hours BNP: No results found for this basename: PROBNP,  in the last  72 hours D-Dimer: No results found for this basename: DDIMER,  in the last 72 hours CBG: No results found for this basename: GLUCAP,  in the last 72 hours Hemoglobin A1C: No results found for this basename: HGBA1C,  in the last 72 hours Fasting Lipid Panel: No results found for this basename: CHOL, HDL, LDLCALC, TRIG, CHOLHDL, LDLDIRECT,  in the last 72 hours Thyroid Function Tests: No results found for this basename: TSH, T4TOTAL, FREET4, T3FREE, THYROIDAB,  in the last 72 hours Anemia Panel: No results found for this basename: VITAMINB12, FOLATE, FERRITIN, TIBC, IRON, RETICCTPCT,  in the last 72 hours Coagulation:  Recent Labs  09/13/13 1635  LABPROT 13.7  INR 1.07   Urine Drug Screen: Drugs of Abuse  No results found for this basename: labopia, cocainscrnur, labbenz, amphetmu, thcu, labbarb    Alcohol Level: No results found for this basename: ETH,  in the last 72 hours Urinalysis:  Recent Labs  09/13/13 1415  COLORURINE YELLOW  LABSPEC 1.025  PHURINE 6.0  GLUCOSEU NEGATIVE  HGBUR NEGATIVE  BILIRUBINUR NEGATIVE  KETONESUR 15*  PROTEINUR NEGATIVE  UROBILINOGEN 2.0*  NITRITE POSITIVE*  LEUKOCYTESUR TRACE*   Misc. Labs:  ABGS No results found for this basename: PHART, PCO2, PO2ART, TCO2, HCO3,  in the last 72 hours CULTURES No results found for this or any previous visit (from the past 240 hour(s)). Studies/Results: Ct Abdomen Pelvis W Contrast  09/13/2013  CLINICAL DATA:  Worsening abdominal pain radiating to low back.  EXAM: CT ABDOMEN AND PELVIS WITH CONTRAST  TECHNIQUE: Multidetector CT imaging of the abdomen and pelvis was performed using the standard protocol following bolus administration of intravenous contrast.  CONTRAST:  162mL OMNIPAQUE IOHEXOL 300 MG/ML  SOLN  COMPARISON:  06/22/2013  FINDINGS: Previous seen bilateral pleural effusions have resolved. Geographic area of decreased enhancement is seen within the spleen, which is consistent with acute  splenic infarct. No evidence of splenomegaly, perisplenic hematoma, or hemoperitoneum.  Tiny left hepatic lobe cyst remains stable and no liver masses are identified. Gallbladder is unremarkable. The pancreas, adrenal glands, and right kidney are normal in appearance. A small left renal cysts again noted, however there is no evidence of renal masses or hydronephrosis.  Severe diverticulosis seen involving the sigmoid colon. Sigmoid colonic wall thickening remains stable with minimal stranding in the adjacent pericolonic fat. This may represent chronic muscular hypertrophy secondary to diverticulosis, although mild diverticulitis cannot be excluded. A soft tissue density with peripheral calcifications again seen along the posterior wall of the sigmoid colon which is stable and likely due to chronic inflammation/fat necrosis. No evidence of bowel obstruction, abscess, or extraluminal gas collection.  A small midline epigastric ventral hernia is again seen containing only omental fat. No evidence herniated bowel loops. Tiny uterine fibroids again noted. Adnexal regions are unremarkable in appearance.  IMPRESSION: Acute splenic infarct.  Stable appearance of severe sigmoid diverticular disease, with chronic muscular hypertrophy versus chronic diverticulitis. No evidence of abscess or other acute complication.  Stable small epigastric ventral hernia containing only fat and tiny uterine fibroids.   Electronically Signed   By: Earle Gell M.D.   On: 09/13/2013 16:04   Dg Chest Portable 1 View  09/13/2013   CLINICAL DATA:  Chest and left arm pain.  Back pain.  EXAM: PORTABLE CHEST - 1 VIEW  COMPARISON:  06/19/2013  FINDINGS: Moderate cardiomegaly stable. Diffuse interstitial edema has resolved since previous study. No definite pleural effusion seen on this portable exam.  IMPRESSION: Stable cardiomegaly. Resolution of interstitial edema since prior exam. No acute findings.   Electronically Signed   By: Earle Gell M.D.    On: 09/13/2013 13:52    Medications:  Prior to Admission:  Prescriptions prior to admission  Medication Sig Dispense Refill  . amLODipine (NORVASC) 5 MG tablet Take 0.5 tablets (2.5 mg total) by mouth daily.  30 tablet  6  . diazepam (VALIUM) 5 MG tablet Take 2.5 mg by mouth 2 (two) times daily as needed for anxiety.      . dicyclomine (BENTYL) 10 MG capsule Take 1 capsule (10 mg total) by mouth 4 (four) times daily -  before meals and at bedtime.  60 capsule  0  . levothyroxine (SYNTHROID, LEVOTHROID) 100 MCG tablet Take 100 mcg by mouth daily. Do not take synthroid on Mon and Thursdays      . megestrol (MEGACE) 400 MG/10ML suspension Take 10 mLs (400 mg total) by mouth daily.  240 mL  1  . metoprolol tartrate (LOPRESSOR) 25 MG tablet Take 0.5 tablets (12.5 mg total) by mouth 2 (two) times daily.  30 tablet  6  . oxyCODONE-acetaminophen (PERCOCET) 7.5-325 MG per tablet Take 1 tablet by mouth every 4 (four) hours as needed for pain.      . potassium chloride 20 MEQ TBCR Take 20 mEq by mouth daily.  10 tablet  0  . pregabalin (LYRICA) 300 MG capsule Take 300 mg  by mouth daily.      . Probiotic Product (ALIGN PO) Take 1 tablet by mouth daily.      . promethazine (PHENERGAN) 25 MG tablet Take 25 mg by mouth every 6 (six) hours as needed for nausea or vomiting.      . ranitidine (ZANTAC) 150 MG capsule Take 150 mg by mouth 2 (two) times daily.      Marland Kitchen saccharomyces boulardii (FLORASTOR) 250 MG capsule Take 1 capsule (250 mg total) by mouth 2 (two) times daily.  30 capsule  1   Scheduled: . capsaicin   Topical BID  . cefTRIAXone (ROCEPHIN)  IV  1 g Intravenous Q24H  . dicyclomine  10 mg Oral TID AC & HS  . famotidine  10 mg Oral BID  . lisinopril  10 mg Oral Daily  . megestrol  400 mg Oral Daily  . metoprolol tartrate  12.5 mg Oral BID  . potassium chloride  20 mEq Oral Daily  . pregabalin  300 mg Oral Daily  . saccharomyces boulardii  250 mg Oral BID  . sodium chloride  3 mL Intravenous  Q12H   Continuous: . sodium chloride 50 mL/hr at 09/13/13 1900  . heparin 900 Units/hr (09/14/13 0316)   ZOX:[id number], acetaminophen, diazepam, morphine injection, ondansetron (ZOFRAN) IV, ondansetron, oxyCODONE, oxyCODONE-acetaminophen, promethazine  Assesment: She was admitted with abdominal pain related to splenic infarction. This is felt to likely be related to atrial fibrillation. She is now anticoagulated. I'm not sure if she should be on Coumadin or on one of the newer anticoagulants so I will consult cardiology regarding that  She has hypertension which is stable.  She appears to be in sinus rhythm now.  She has mildly decreased cardiac ejection fraction but is asymptomatic from that at this point  She had severe Clostridium difficile colitis with severe diarrhea and now has protein calorie malnutrition  She has shingles Principal Problem:   Splenic infarct Active Problems:   Hypertension   Atrial fibrillation   Protein-calorie malnutrition, severe   CHF (congestive heart failure)   UTI (urinary tract infection)   Abdominal pain   History of shingles   LBBB (left bundle branch block)   Diverticulosis   History of Clostridium difficile colitis    Plan: Continue heparin. I have ordered her a heart healthy diet. She will continue other treatments. Cardiology consultation regarding long-term anticoagulation if indicated    LOS: 1 day   Alonza Bogus 09/14/2013, 9:47 AM

## 2013-09-14 NOTE — Progress Notes (Signed)
RN notified on-call pharmacist of Heparin level result. Orders to follow.

## 2013-09-15 ENCOUNTER — Encounter (HOSPITAL_COMMUNITY): Payer: Self-pay | Admitting: Adult Health

## 2013-09-15 DIAGNOSIS — I4891 Unspecified atrial fibrillation: Secondary | ICD-10-CM

## 2013-09-15 LAB — HEPARIN LEVEL (UNFRACTIONATED)
Heparin Unfractionated: 0.23 IU/mL — ABNORMAL LOW (ref 0.30–0.70)
Heparin Unfractionated: 0.26 IU/mL — ABNORMAL LOW (ref 0.30–0.70)
Heparin Unfractionated: 0.22 IU/mL — ABNORMAL LOW (ref 0.30–0.70)

## 2013-09-15 LAB — CBC
HEMATOCRIT: 30.4 % — AB (ref 36.0–46.0)
HEMOGLOBIN: 10.3 g/dL — AB (ref 12.0–15.0)
MCH: 30 pg (ref 26.0–34.0)
MCHC: 33.9 g/dL (ref 30.0–36.0)
MCV: 88.6 fL (ref 78.0–100.0)
Platelets: 207 10*3/uL (ref 150–400)
RBC: 3.43 MIL/uL — AB (ref 3.87–5.11)
RDW: 14.9 % (ref 11.5–15.5)
WBC: 10.8 10*3/uL — ABNORMAL HIGH (ref 4.0–10.5)

## 2013-09-15 MED ORDER — ENSURE COMPLETE PO LIQD: 237.0000 mL | Freq: Two times a day (BID) | ORAL | Status: DC

## 2013-09-15 MED ORDER — NYSTATIN 100000 UNIT/GM EX CREA
TOPICAL_CREAM | Freq: Two times a day (BID) | CUTANEOUS | Status: DC
  Administered 2013-09-15 (×2): 1 via TOPICAL
  Administered 2013-09-16: 11:00:00 via TOPICAL
  Filled 2013-09-15: qty 15

## 2013-09-15 MED ORDER — METOPROLOL SUCCINATE ER 25 MG PO TB24
12.5000 mg | ORAL_TABLET | Freq: Every day | ORAL | Status: DC
  Administered 2013-09-16: 12.5 mg via ORAL
  Filled 2013-09-15: qty 1

## 2013-09-15 NOTE — Progress Notes (Signed)
Danielle [name] FGH:[id number] DOB: 1930-07-13 DOA: 09/13/2013 PCP: [name], MD   Subjective: This lady feels overall much improved although her pain is somewhat still present but much more bearable. She is on intravenous heparin drip. She has splenic infarcts.           Physical Exam: Blood pressure 121/79, pulse 56, temperature 98.3 F (36.8 C), temperature source Oral, resp. rate 20, height 5\' 6"  (1.676 m), weight 65.5 kg (144 lb 6.4 oz), SpO2 98.00%. She looks systemically well and clinically she is in sinus rhythm at the present time. Lung fields are clear. Abdomen is soft nontender. She is alert and oriented.   Investigations:  Recent Results (from the past 240 hour(s))  URINE CULTURE     Status: None   Collection Time    09/13/13  2:15 PM      Result Value Ref Range Status   Specimen Description URINE, CLEAN CATCH   Final   Special Requests NONE   Final   Culture  Setup Time     Final   Value: 09/13/2013 20:40     Performed at [facility]     Final   Value: >=100,000 COLONIES/ML     Performed at [facility]   Culture     Final   Value: [place]     Performed at [facility]   Report Status PENDING   Incomplete     Basic Metabolic Panel:  Recent Labs  09/13/13 1316 09/14/13 0546  NA 137 142  K 3.3* 3.7  CL 98 105  CO2 25 25  GLUCOSE 126* 91  BUN 12 13  CREATININE 0.73 0.85  CALCIUM 8.6 8.3*   Liver Function Tests: No results found for this basename: AST, ALT, ALKPHOS, BILITOT, PROT, ALBUMIN,  in the last 72 hours   CBC:  Recent Labs  09/13/13 1316 09/14/13 0546 09/15/13 0137  WBC 14.3* 13.1* 10.8*  NEUTROABS 11.0*  --   --   HGB 13.8 11.8* 10.3*  HCT 41.4 35.4* 30.4*  MCV 88.8 90.1 88.6  PLT 300 276 207    Ct Abdomen Pelvis W Contrast  09/13/2013   CLINICAL DATA:  Worsening abdominal pain radiating to low back.  EXAM: CT ABDOMEN AND PELVIS WITH CONTRAST  TECHNIQUE:  Multidetector CT imaging of the abdomen and pelvis was performed using the standard protocol following bolus administration of intravenous contrast.  CONTRAST:  138mL OMNIPAQUE IOHEXOL 300 MG/ML  SOLN  COMPARISON:  06/22/2013  FINDINGS: Previous seen bilateral pleural effusions have resolved. Geographic area of decreased enhancement is seen within the spleen, which is consistent with acute splenic infarct. No evidence of splenomegaly, perisplenic hematoma, or hemoperitoneum.  Tiny left hepatic lobe cyst remains stable and no liver masses are identified. Gallbladder is unremarkable. The pancreas, adrenal glands, and right kidney are normal in appearance. A small left renal cysts again noted, however there is no evidence of renal masses or hydronephrosis.  Severe diverticulosis seen involving the sigmoid colon. Sigmoid colonic wall thickening remains stable with minimal stranding in the adjacent pericolonic fat. This may represent chronic muscular hypertrophy secondary to diverticulosis, although mild diverticulitis cannot be excluded. A soft tissue density with peripheral calcifications again seen along the posterior wall of the sigmoid colon which is stable and likely due to chronic inflammation/fat necrosis. No evidence of bowel obstruction, abscess, or extraluminal gas collection.  A small midline epigastric ventral hernia is again seen containing only omental  fat. No evidence herniated bowel loops. Tiny uterine fibroids again noted. Adnexal regions are unremarkable in appearance.  IMPRESSION: Acute splenic infarct.  Stable appearance of severe sigmoid diverticular disease, with chronic muscular hypertrophy versus chronic diverticulitis. No evidence of abscess or other acute complication.  Stable small epigastric ventral hernia containing only fat and tiny uterine fibroids.   Electronically Signed   By: Earle Gell M.D.   On: 09/13/2013 16:04   Dg Chest Portable 1 View  09/13/2013   CLINICAL DATA:  Chest and  left arm pain.  Back pain.  EXAM: PORTABLE CHEST - 1 VIEW  COMPARISON:  06/19/2013  FINDINGS: Moderate cardiomegaly stable. Diffuse interstitial edema has resolved since previous study. No definite pleural effusion seen on this portable exam.  IMPRESSION: Stable cardiomegaly. Resolution of interstitial edema since prior exam. No acute findings.   Electronically Signed   By: Earle Gell M.D.   On: 09/13/2013 13:52      Medications: I have reviewed the patient's current medications.  Impression: 1. Splenic infarcts possibly related to paroxysmal atrial fibrillation. 2. Paroxysmal atrial fibrillation, currently in sinus rhythm clinically. 3. History of congestive heart failure, currently compensated. 4. Hypertension.     Plan: 1. Continue with intravenous heparin for anticoagulation. 2. Cardiology consultation to recommend long-term oral anticoagulation therapy. 3. Possible discharge home tomorrow.  Consultants:  Cardiology consultation pending.   Procedures:  None.   Antibiotics:  None.                   Code Status: Full code.  Family Communication: I discussed the plan with the patient at the bedside.   Disposition Plan: Home when medically stable.  Time spent: 15 minutes.   LOS: 2 days   Laderius Valbuena C Mallie Linnemann   09/15/2013, 8:24 AM

## 2013-09-15 NOTE — Progress Notes (Signed)
INITIAL NUTRITION ASSESSMENT  DOCUMENTATION CODES Per approved criteria  -Severe malnutrition in the context of chronic illness   INTERVENTION: Ensure Complete po BID, each supplement provides 350 kcal and 13 grams of protein   NUTRITION DIAGNOSIS: Malnutrition ongoing related to inadequate oral intake as evidenced by significant wt loss of 8% < 90days.   Goal: Prevent further weight loss and meet >/= 90% of their estimated nutrition needs    Monitor:  Po  Intake (meals and supplements) , labs and wt changes   Reason for Assessment: Malnutrition Screen Score =  2  80 y.o. female  Admitting Dx: Splenic infarct  ASSESSMENT:  Pt had c.diff and shingles since February and says "I just haven't been that hungry". Found to have severe malnutrition February and she has been unable to regain to her previous usual body weight. Significant weight loss of 8% <90 days. She drinks Ensure at home and likes "milk chocolate" flavor. Feeds herself.  Height: Ht Readings from Last 1 Encounters:  09/13/13 5\' 6"  (1.676 m)    Weight: Wt Readings from Last 1 Encounters:  09/13/13 144 lb 6.4 oz (65.5 kg)    Ideal Body Weight: 130# (59 kg)  % Ideal Body Weight: 111%  Wt Readings from Last 10 Encounters:  09/13/13 144 lb 6.4 oz (65.5 kg)  09/10/13 146 lb (66.225 kg)  08/27/13 142 lb (64.411 kg)  07/17/13 137 lb (62.143 kg)  07/16/13 142 lb 14.4 oz (64.819 kg)  07/04/13 158 lb 1.1 oz (71.7 kg)  06/26/13 157 lb 6.5 oz (71.4 kg)  03/28/11 169 lb 6.4 oz (76.839 kg)  03/16/11 165 lb 9.1 oz (75.1 kg)  03/16/11 165 lb 9.1 oz (75.1 kg)    Usual Body Weight: 157-160#  % Usual Body Weight: 92%  BMI:  Body mass index is 23.32 kg/(m^2).normal range  Estimated Nutritional Needs: Kcal: 1650-1850  Protein: 79-86 gr Fluid: 1.7-1.9 liters daily  Skin: intact  Diet Order: Cardiac  EDUCATION NEEDS: -Education needs addressed   Intake/Output Summary (Last 24 hours) at 09/15/13 1633 Last  data filed at 09/14/13 2230  Gross per 24 hour  Intake    480 ml  Output    201 ml  Net    279 ml    Last BM: 09/14/13  Labs:   Recent Labs Lab 09/13/13 1316 09/14/13 0546  NA 137 142  K 3.3* 3.7  CL 98 105  CO2 25 25  BUN 12 13  CREATININE 0.73 0.85  CALCIUM 8.6 8.3*  GLUCOSE 126* 91    CBG (last 3)  No results found for this basename: GLUCAP,  in the last 72 hours  Scheduled Meds: . capsaicin   Topical BID  . cefTRIAXone (ROCEPHIN)  IV  1 g Intravenous Q24H  . dicyclomine  10 mg Oral TID AC & HS  . famotidine  10 mg Oral BID  . lisinopril  10 mg Oral Daily  . megestrol  400 mg Oral Daily  . [START ON 09/16/2013] metoprolol succinate  12.5 mg Oral Daily  . metoprolol tartrate  12.5 mg Oral BID  . nystatin cream   Topical BID  . potassium chloride  20 mEq Oral Daily  . pregabalin  300 mg Oral Daily  . saccharomyces boulardii  250 mg Oral BID  . sodium chloride  3 mL Intravenous Q12H    Continuous Infusions: . sodium chloride 50 mL/hr at 09/15/13 0824  . heparin 1,700 Units/hr (09/15/13 1614)    Past Medical History  Diagnosis Date  . Osteoarthritis   . Macular degeneration   . Hypertension   . Thyroid disease   . Thyroid cancer   . GI bleed   . Atrial fibrillation   . C. difficile colitis   . Diverticulitis   . Shingles   . Lupus     Past Surgical History  Procedure Laterality Date  . Thyroidectomy    . Colonoscopy  03/17/2011    Procedure: COLONOSCOPY;  Surgeon: Rogene Houston, MD;  Location: [facility];  Service: Endoscopy;  Laterality: N/A;  . Cyst removal neck      Colman Cater MS,RD,CSG,LDN Office: [phone] Pager: [phone]

## 2013-09-15 NOTE — Consult Note (Signed)
CARDIOLOGY CONSULT NOTE   Patient ID: Danielle [name] MRN: [id number] DOB/AGE: 1930/07/06 80 y.o.  Admit Date: 09/13/2013 Referring Physician: Hurshel Party MD Primary Physician: [name], MD Consulting Cardiologist: Carlyle Dolly MD Primary Cardiologist: Dorris Carnes MD Reason for Consultation: Splenic Infarct, recommendations for anticoagulation.  Clinical Summary Danielle [name] is a 80 y.o.female with know history of paroxysmal atrial fib, not on anticoagulation (due to frequent dizzy spells, hypotension-in the setting of C-Diff colitis),  and high fall risk, hypothyroidism, hypertension,  chronic LBBB, systolic dysfunction with most recent echo in Feb of 2015,  revealing EF of 40-45%,  admitted with abdominal pain radiation from her hips bilaterally, to her back. Described as stabbing, with pain over her left quadrant. CT scan demonstrated splenic infarct and chronic severe sigmoid diverticulitis. Vascular surgery was consulted and recommended medical management with pain control and anticoagulation. She has been placed on a heparin drip.Marland Kitchen She  was also found to have shingles across her left breast with significant pain. ,Review of prior records demonstrating this began in March of this year.   On last office visit on Sep 10, 2013, she was in NSR per assessment. She was started back on metoprolol 12.5 mg BID due to elevated HR and mild hypertension. Amlodipine was decreased to 2.5 mg daily.   Of note, both her sister and her father had DVT;s in the past.   She is currently comfortable without complaints with the exception of overall weakness and fatigue.   Allergies  Allergen Reactions  . Thyroid Hormones     proparathyroid thyroid medication    Medications Scheduled Medications: . capsaicin   Topical BID  . cefTRIAXone (ROCEPHIN)  IV  1 g Intravenous Q24H  . dicyclomine  10 mg Oral TID AC & HS  . famotidine  10 mg Oral BID  . lisinopril  10 mg Oral Daily  .  megestrol  400 mg Oral Daily  . metoprolol tartrate  12.5 mg Oral BID  . nystatin cream   Topical BID  . potassium chloride  20 mEq Oral Daily  . pregabalin  300 mg Oral Daily  . saccharomyces boulardii  250 mg Oral BID  . sodium chloride  3 mL Intravenous Q12H    Infusions: . sodium chloride 50 mL/hr at 09/15/13 0824  . heparin 1,300 Units/hr (09/15/13 0020)    PRN Medications: acetaminophen, acetaminophen, diazepam, morphine injection, ondansetron (ZOFRAN) IV, ondansetron, oxyCODONE, oxyCODONE-acetaminophen, promethazine   Past Medical History  Diagnosis Date  . Osteoarthritis   . Macular degeneration   . Hypertension   . Thyroid disease   . Thyroid cancer   . GI bleed   . Atrial fibrillation   . C. difficile colitis   . Diverticulitis   . Shingles   . Lupus     Past Surgical History  Procedure Laterality Date  . Thyroidectomy    . Colonoscopy  03/17/2011    Procedure: COLONOSCOPY;  Surgeon: Rogene Houston, MD;  Location: [facility];  Service: Endoscopy;  Laterality: N/A;  . Cyst removal neck      Family History  Problem Relation Age of Onset  . Tuberculosis Mother   . Thrombosis Father   . Deep vein thrombosis Sister   . Deep vein thrombosis Father     Social History Danielle [name] reports that she has never smoked. She has never used smokeless tobacco. Danielle [name] reports that she does not drink alcohol.  Review of Systems Otherwise reviewed and negative except as  outlined.  Physical Examination Blood pressure 121/79, pulse 56, temperature 98.3 F (36.8 C), temperature source Oral, resp. rate 20, height 5\' 6"  (1.676 m), weight 144 lb 6.4 oz (65.5 kg), SpO2 98.00%.  Intake/Output Summary (Last 24 hours) at 09/15/13 1030 Last data filed at 09/14/13 2230  Gross per 24 hour  Intake 1647.43 ml  Output    204 ml  Net 1443.43 ml    Telemetry: NSR with LBBB.   GEN: No acute distress, resting comfortably.  HEENT: Conjunctiva and lids normal,  oropharynx clear with moist mucosa. Neck: Supple, no elevated JVP or carotid bruits, no thyromegaly. Lungs: Clear to auscultation, nonlabored breathing at rest.Pain with inspiration.  Cardiac: Regular rate and rhythm, no S3 or significant systolic murmur, no pericardial rub. Abdomen: Soft, nontender, no hepatomegaly, bowel sounds present, no guarding or rebound. Extremities: No pitting edema, distal pulses 2+. Skin: Warm and dry. Musculoskeletal: No kyphosis. Neuropsychiatric: Alert and oriented x3, affect grossly appropriate.  Prior Cardiac Testing/Procedures 1.Echocardiogram: 06/18/2013 Left ventricle: The cavity size was normal. There was mild focal basal hypertrophy of the septum. Systolic function was mildly to moderately reduced. The estimated ejection fraction was in the range of 40% to 45% - some views look somewhat better. Probable severe hypokinesis of the mid-distal anteroseptal myocardium - abnormal septal motion with LBBB likely also contributes. Doppler parameters are consistent with abnormal left ventricular relaxation (grade 1 diastolic dysfunction). Doppler parameters are consistent with high ventricular filling pressure. - Ventricular septum: Septal motion showed abnormal function and dyssynergy. - Aortic valve: Trileaflet; mildly calcified leaflets. - Mitral valve: Calcified annulus. Mildly thickened leaflets . Mild regurgitation directed eccentrically. - Left atrium: The atrium was moderately dilated. - Right atrium: The atrium was moderately dilated. Central venous pressure: 20mm Hg (est). - Tricuspid valve: Moderate regurgitation. - Pulmonary arteries: PA peak pressure: 37mm Hg (S). - Pericardium, extracardiac: A prominent pericardial fat pad was present.   Lab Results  Basic Metabolic Panel:  Recent Labs Lab 09/13/13 1316 09/14/13 0546  NA 137 142  K 3.3* 3.7  CL 98 105  CO2 25 25  GLUCOSE 126* 91  BUN 12 13  CREATININE 0.73 0.85  CALCIUM 8.6  8.3*    CBC:  Recent Labs Lab 09/13/13 1316 09/14/13 0546 09/15/13 0137  WBC 14.3* 13.1* 10.8*  NEUTROABS 11.0*  --   --   HGB 13.8 11.8* 10.3*  HCT 41.4 35.4* 30.4*  MCV 88.8 90.1 88.6  PLT 300 276 207       Radiology: Ct Abdomen Pelvis W Contrast  09/13/2013   CLINICAL DATA:  Worsening abdominal pain radiating to low back.  EXAM: CT ABDOMEN AND PELVIS WITH CONTRAST  TECHNIQUE: IMPRESSION: Acute splenic infarct.  Stable appearance of severe sigmoid diverticular disease, with chronic muscular hypertrophy versus chronic diverticulitis. No evidence of abscess or other acute complication.  Stable small epigastric ventral hernia containing only fat and tiny uterine fibroids.   Electronically Signed   By: Earle Gell M.D.   On: 09/13/2013 16:04   Dg Chest Portable 1 View  09/13/2013   CLINICAL DATA:  Chest and left arm pain.  Back pain.  EXAM: PORTABLE CHEST - 1 VIEW  COMPARISON:  06/19/2013  FINDINGS: Moderate cardiomegaly stable. Diffuse interstitial edema has resolved since previous study. No definite pleural effusion seen on this portable exam.  IMPRESSION: Stable cardiomegaly. Resolution of interstitial edema since prior exam. No acute findings.   Electronically Signed   By: Earle Gell M.D.   On: 09/13/2013  13:52     ECG: NSR with LBBB and occasional PVCs   Impression and Recommendations  1. Splenic Infarct: Now comfortable without complaints of abdominal pain. She is now on heparin gtt. Vascular surgery has recommended medical therapy only. She is interested in anticoagulation therapy, as her family members also have his of blood clots, sister and father. CHADS VASC score of 5,  (age, female, systolic dysfunction, hypertension). Recommend Eliquis 5 m BID, creatinine 0.85. No history of GIB.   2. Atrial fibrillation: Currently in NSR.with LBBB on metoprolol 12.5 mg BID. CHADs Vasc Score 5. Question if splenic infarct related to atrial fib vs hypercoagulable state with family  history of same. Check hypercoagulable panel.   3. Hypertension: Low normal.with systolic dysfunction. No evidence of significant hypotension.   4. Systolic Dysfunction: EF of 40-45% per echo in Feb of 2015. No evidence of CHF or fluid overload.   5. Herpes Simplex of left breast: She is still sore in left breast and is tolerating pain. She is on Lyrica, but no antiviral at this time.     Signed: Phill Myron. Purcell Nails NP [facility] 09/15/2013, 10:30 AM Co-Sign MD  Attending Note Patient seen and discussed with NP Purcell Nails. 80 yo female history of hypothyroidism, thyroid cancer, hx of c.diff, afib, chronic LBBB, mild LV systolic dysfunction admitted with acute abdominal pain. CT showed evidence of splenic infarct, vascular surgery was consulted per the notes with recs for conservative management along with anticoagulation. She does have a histrory of afib with CHADS2Vasc score of 5, had not previously been on anticoagulation at time of admission due to prior history of low bp and dizziness. Prior CTA of abdomen and pelvis with extensive atherosclerotic plaque, this also a potential source for infarct. She reports her prior problems with dizziness have resolved, has never had any troubles with falls. Given her afib with high CHADS2Vasc score with possible thromboembolic event agree with anticoag, when ready to stop heparin recommend starting eliquis 5mg  bid. In setting of mild LV systolic dysfunction will change lopressor to Toprol XL 12.5mg  daily (I have written to start this tomrrow), continue ACE-I. Chronic LBBB with plans for outpatient myoview per her primary cardiologists last note.   Carlyle Dolly MD

## 2013-09-15 NOTE — Care Management Note (Unsigned)
    Page 1 of 1   09/15/2013     4:15:05 PM CARE MANAGEMENT NOTE 09/15/2013  Patient:  Danielle [name], Danielle [name]   Account Number:  [id number]  Date Initiated:  09/15/2013  Documentation initiated by:  Claretha Cooper  Subjective/Objective Assessment:   Pt lives at home with spouse. States she is normally independent but has had health issues since Feb. Has previously had [facility] for RN. Does NOT want PT eval or HH PT. Has all the Equip needed.     Action/Plan:   Anticipated DC Date:  09/17/2013   Anticipated DC Plan:  [place]  CM consult      Choice offered to / List presented to:          [facility] arranged  HH-1 RN      [place].   Status of service:   Medicare Important Message given?   (If response is "NO", the following Medicare IM given date fields will be blank) Date Medicare IM given:   Date Additional Medicare IM given:    Discharge Disposition:    Per UR Regulation:    If discussed at Long Length of Stay Meetings, dates discussed:    Comments:  09/15/13 Claretha Cooper RN BSN CM

## 2013-09-15 NOTE — Progress Notes (Signed)
[place] for Heparin Indication: hx of afib. splenic infarction.  Allergies  Allergen Reactions  . Thyroid Hormones     proparathyroid thyroid medication   Patient Measurements: Height: 5\' 6"  (167.6 cm) Weight: 144 lb 6.4 oz (65.5 kg) IBW/kg (Calculated) : 59.3 Heparin Weight = 64.4 KG  Vital Signs: Temp: 98.2 F (36.8 C) (05/10 2250) Temp src: Oral (05/10 2250) BP: 85/66 mmHg (05/10 2250) Pulse Rate: 65 (05/10 2250)  Labs:  Recent Labs  09/13/13 1316 09/13/13 1635 09/14/13 0546 09/14/13 1007 09/14/13 1653 09/15/13 0137  HGB 13.8  --  11.8*  --   --  10.3*  HCT 41.4  --  35.4*  --   --  30.4*  PLT 300  --  276  --   --  207  LABPROT  --  13.7  --   --   --   --   INR  --  1.07  --   --   --   --   HEPARINUNFRC  --   --   --  <0.10* <0.10* 0.23*  CREATININE 0.73  --  0.85  --   --   --    Estimated Creatinine Clearance: 49.4 ml/min (by C-G formula based on Cr of 0.85).  Medical History: Past Medical History  Diagnosis Date  . Osteoarthritis   . Macular degeneration   . Hypertension   . Thyroid disease   . Thyroid cancer   . GI bleed   . Atrial fibrillation   . C. difficile colitis   . Diverticulitis   . Shingles   . Lupus    Medications:  Prescriptions prior to admission  Medication Sig Dispense Refill  . amLODipine (NORVASC) 5 MG tablet Take 0.5 tablets (2.5 mg total) by mouth daily.  30 tablet  6  . diazepam (VALIUM) 5 MG tablet Take 2.5 mg by mouth 2 (two) times daily as needed for anxiety.      . dicyclomine (BENTYL) 10 MG capsule Take 1 capsule (10 mg total) by mouth 4 (four) times daily -  before meals and at bedtime.  60 capsule  0  . levothyroxine (SYNTHROID, LEVOTHROID) 100 MCG tablet Take 100 mcg by mouth daily. Do not take synthroid on Mon and Thursdays      . megestrol (MEGACE) 400 MG/10ML suspension Take 10 mLs (400 mg total) by mouth daily.  240 mL  1  . metoprolol tartrate (LOPRESSOR) 25 MG tablet  Take 0.5 tablets (12.5 mg total) by mouth 2 (two) times daily.  30 tablet  6  . oxyCODONE-acetaminophen (PERCOCET) 7.5-325 MG per tablet Take 1 tablet by mouth every 4 (four) hours as needed for pain.      . potassium chloride 20 MEQ TBCR Take 20 mEq by mouth daily.  10 tablet  0  . pregabalin (LYRICA) 300 MG capsule Take 300 mg by mouth daily.      . Probiotic Product (ALIGN PO) Take 1 tablet by mouth daily.      . promethazine (PHENERGAN) 25 MG tablet Take 25 mg by mouth every 6 (six) hours as needed for nausea or vomiting.      . ranitidine (ZANTAC) 150 MG capsule Take 150 mg by mouth 2 (two) times daily.      Marland Kitchen saccharomyces boulardii (FLORASTOR) 250 MG capsule Take 1 capsule (250 mg total) by mouth 2 (two) times daily.  30 capsule  1   Home meds reviewed  Assessment: Okay for Protocol ,  heparin level below goal x 3.  Now trending up.  H/H and PLTC lower than baseline.  Goal of Therapy:  Heparin Level 0.3 - 0.7 Monitor platelets by anticoagulation protocol: Yes   Plan:  Increase Heparin to 1400 units/hr Heparin Level 6-8 hours and continue daily. CBC per protocol.  [name] 09/15/2013,6:33 AM

## 2013-09-15 NOTE — Progress Notes (Signed)
[place] for Heparin Indication: hx of afib. splenic infarction.  Allergies  Allergen Reactions  . Thyroid Hormones     proparathyroid thyroid medication   Patient Measurements: Height: 5\' 6"  (167.6 cm) Weight: 144 lb 6.4 oz (65.5 kg) IBW/kg (Calculated) : 59.3 Heparin Weight = 64.4 KG  Vital Signs: Temp: 98.3 F (36.8 C) (05/11 0645) Temp src: Oral (05/11 0645) BP: 121/79 mmHg (05/11 0645) Pulse Rate: 56 (05/11 0645)  Labs:  Recent Labs  09/13/13 1316 09/13/13 1635 09/14/13 0546  09/14/13 1653 09/15/13 0137 09/15/13 1301  HGB 13.8  --  11.8*  --   --  10.3*  --   HCT 41.4  --  35.4*  --   --  30.4*  --   PLT 300  --  276  --   --  207  --   LABPROT  --  13.7  --   --   --   --   --   INR  --  1.07  --   --   --   --   --   HEPARINUNFRC  --   --   --   < > <0.10* 0.23* 0.26*  CREATININE 0.73  --  0.85  --   --   --   --   < > = values in this interval not displayed. Estimated Creatinine Clearance: 49.4 ml/min (by C-G formula based on Cr of 0.85).  Medical History: Past Medical History  Diagnosis Date  . Osteoarthritis   . Macular degeneration   . Hypertension   . Thyroid disease   . Thyroid cancer   . GI bleed   . Atrial fibrillation   . C. difficile colitis   . Diverticulitis   . Shingles   . Lupus    Medications:  Prescriptions prior to admission  Medication Sig Dispense Refill  . amLODipine (NORVASC) 5 MG tablet Take 0.5 tablets (2.5 mg total) by mouth daily.  30 tablet  6  . diazepam (VALIUM) 5 MG tablet Take 2.5 mg by mouth 2 (two) times daily as needed for anxiety.      . dicyclomine (BENTYL) 10 MG capsule Take 1 capsule (10 mg total) by mouth 4 (four) times daily -  before meals and at bedtime.  60 capsule  0  . levothyroxine (SYNTHROID, LEVOTHROID) 100 MCG tablet Take 100 mcg by mouth daily. Do not take synthroid on Mon and Thursdays      . megestrol (MEGACE) 400 MG/10ML suspension Take 10 mLs (400 mg  total) by mouth daily.  240 mL  1  . metoprolol tartrate (LOPRESSOR) 25 MG tablet Take 0.5 tablets (12.5 mg total) by mouth 2 (two) times daily.  30 tablet  6  . oxyCODONE-acetaminophen (PERCOCET) 7.5-325 MG per tablet Take 1 tablet by mouth every 4 (four) hours as needed for pain.      . potassium chloride 20 MEQ TBCR Take 20 mEq by mouth daily.  10 tablet  0  . pregabalin (LYRICA) 300 MG capsule Take 300 mg by mouth daily.      . Probiotic Product (ALIGN PO) Take 1 tablet by mouth daily.      . promethazine (PHENERGAN) 25 MG tablet Take 25 mg by mouth every 6 (six) hours as needed for nausea or vomiting.      . ranitidine (ZANTAC) 150 MG capsule Take 150 mg by mouth 2 (two) times daily.      Marland Kitchen saccharomyces boulardii (FLORASTOR)  250 MG capsule Take 1 capsule (250 mg total) by mouth 2 (two) times daily.  30 capsule  1   Home meds reviewed  Assessment: Okay for Protocol , heparin level below goal x 4.  Now trending up.  H/H and PLTC lower than baseline.  Goal of Therapy:  Heparin Level 0.3 - 0.7 Monitor platelets by anticoagulation protocol: Yes   Plan:  Increase Heparin to 1700 units/hr Heparin Level 6-8 hours and continue daily. CBC per protocol.  Jeananne Bedwell A Jamion Carter 09/15/2013,3:03 PM

## 2013-09-15 NOTE — Progress Notes (Signed)
[place] for Heparin Indication: hx of afib. splenic infarction.  Allergies  Allergen Reactions  . Thyroid Hormones     proparathyroid thyroid medication   Patient Measurements: Height: 5\' 6"  (167.6 cm) Weight: 144 lb 6.4 oz (65.5 kg) IBW/kg (Calculated) : 59.3 Heparin Weight = 64.4 KG  Vital Signs: Temp: 97.9 F (36.6 C) (05/11 2105) Temp src: Oral (05/11 2105) BP: 125/46 mmHg (05/11 2105) Pulse Rate: 62 (05/11 2105)  Labs:  Recent Labs  09/13/13 1316 09/13/13 1635 09/14/13 0546  09/15/13 0137 09/15/13 1301 09/15/13 1950  HGB 13.8  --  11.8*  --  10.3*  --   --   HCT 41.4  --  35.4*  --  30.4*  --   --   PLT 300  --  276  --  207  --   --   LABPROT  --  13.7  --   --   --   --   --   INR  --  1.07  --   --   --   --   --   HEPARINUNFRC  --   --   --   < > 0.23* 0.26* 0.22*  CREATININE 0.73  --  0.85  --   --   --   --   < > = values in this interval not displayed. Estimated Creatinine Clearance: 49.4 ml/min (by C-G formula based on Cr of 0.85).  Medical History: Past Medical History  Diagnosis Date  . Osteoarthritis   . Macular degeneration   . Hypertension   . Thyroid disease   . Thyroid cancer   . GI bleed   . Atrial fibrillation   . C. difficile colitis   . Diverticulitis   . Shingles   . Lupus    Medications:  Prescriptions prior to admission  Medication Sig Dispense Refill  . amLODipine (NORVASC) 5 MG tablet Take 0.5 tablets (2.5 mg total) by mouth daily.  30 tablet  6  . diazepam (VALIUM) 5 MG tablet Take 2.5 mg by mouth 2 (two) times daily as needed for anxiety.      . dicyclomine (BENTYL) 10 MG capsule Take 1 capsule (10 mg total) by mouth 4 (four) times daily -  before meals and at bedtime.  60 capsule  0  . levothyroxine (SYNTHROID, LEVOTHROID) 100 MCG tablet Take 100 mcg by mouth daily. Do not take synthroid on Mon and Thursdays      . megestrol (MEGACE) 400 MG/10ML suspension Take 10 mLs (400 mg total)  by mouth daily.  240 mL  1  . metoprolol tartrate (LOPRESSOR) 25 MG tablet Take 0.5 tablets (12.5 mg total) by mouth 2 (two) times daily.  30 tablet  6  . oxyCODONE-acetaminophen (PERCOCET) 7.5-325 MG per tablet Take 1 tablet by mouth every 4 (four) hours as needed for pain.      . potassium chloride 20 MEQ TBCR Take 20 mEq by mouth daily.  10 tablet  0  . pregabalin (LYRICA) 300 MG capsule Take 300 mg by mouth daily.      . Probiotic Product (ALIGN PO) Take 1 tablet by mouth daily.      . promethazine (PHENERGAN) 25 MG tablet Take 25 mg by mouth every 6 (six) hours as needed for nausea or vomiting.      . ranitidine (ZANTAC) 150 MG capsule Take 150 mg by mouth 2 (two) times daily.      Marland Kitchen saccharomyces boulardii (FLORASTOR)  250 MG capsule Take 1 capsule (250 mg total) by mouth 2 (two) times daily.  30 capsule  1   Home meds reviewed  Assessment: Okay for Protocol , heparin level below goal x 5 despite rate increases.  H/H and PLTC lower than baseline.  Goal of Therapy:  Heparin Level 0.3 - 0.7 Monitor platelets by anticoagulation protocol: Yes   Plan:  Increase Heparin to 1900 units/hr Heparin Level daily. CBC per protocol.  Shonda Mandarino A Gabriel Paulding 09/15/2013,9:14 PM

## 2013-09-15 NOTE — Care Management Utilization Note (Signed)
UR completed 

## 2013-09-16 LAB — CBC
HEMATOCRIT: 28.8 % — AB (ref 36.0–46.0)
Hemoglobin: 9.7 g/dL — ABNORMAL LOW (ref 12.0–15.0)
MCH: 29.9 pg (ref 26.0–34.0)
MCHC: 33.7 g/dL (ref 30.0–36.0)
MCV: 88.9 fL (ref 78.0–100.0)
Platelets: 207 10*3/uL (ref 150–400)
RBC: 3.24 MIL/uL — ABNORMAL LOW (ref 3.87–5.11)
RDW: 14.9 % (ref 11.5–15.5)
WBC: 6.7 10*3/uL (ref 4.0–10.5)

## 2013-09-16 LAB — BETA-2-GLYCOPROTEIN I ABS, IGG/M/A
BETA-2-GLYCOPROTEIN I IGA: 8 A Units (ref <20)
BETA-2-GLYCOPROTEIN I IGM: 7 M Units (ref <20)
Beta-2 Glyco I IgG: 10 G Units (ref <20)

## 2013-09-16 LAB — URINE CULTURE: Colony Count: >=100000

## 2013-09-16 LAB — HOMOCYSTEINE: HOMOCYSTEINE-NORM: 10.3 umol/L (ref 4.0–15.4)

## 2013-09-16 LAB — CARDIOLIPIN ANTIBODIES, IGG, IGM, IGA
ANTICARDIOLIPIN IGA: 4 U/mL — AB (ref <22)
ANTICARDIOLIPIN IGG: 9 GPL U/mL — AB (ref <23)
Anticardiolipin IgM: 4 MPL U/mL — ABNORMAL LOW (ref <11)

## 2013-09-16 LAB — ANTITHROMBIN III: AntiThromb III Func: 114 % (ref 75–120)

## 2013-09-16 LAB — HEPARIN LEVEL (UNFRACTIONATED): Heparin Unfractionated: 0.41 IU/mL (ref 0.30–0.70)

## 2013-09-16 MED ORDER — LISINOPRIL 10 MG PO TABS: 10.0000 mg | ORAL_TABLET | Freq: Every day | ORAL | Status: AC

## 2013-09-16 MED ORDER — APIXABAN 5 MG PO TABS: 5.0000 mg | ORAL_TABLET | Freq: Two times a day (BID) | ORAL | Status: AC

## 2013-09-16 MED ORDER — APIXABAN 5 MG PO TABS
5.0000 mg | ORAL_TABLET | Freq: Two times a day (BID) | ORAL | Status: DC
  Administered 2013-09-16: 5 mg via ORAL
  Filled 2013-09-16: qty 1

## 2013-09-16 MED ORDER — CAPSAICIN 0.025 % EX CREA: TOPICAL_CREAM | Freq: Two times a day (BID) | CUTANEOUS | Status: AC

## 2013-09-16 MED ORDER — NYSTATIN 100000 UNIT/GM EX CREA: TOPICAL_CREAM | Freq: Two times a day (BID) | CUTANEOUS | Status: DC

## 2013-09-16 MED ORDER — METOPROLOL SUCCINATE 12.5 MG HALF TABLET: 12.5000 mg | ORAL_TABLET | Freq: Every day | ORAL | Status: DC

## 2013-09-16 MED ORDER — CIPROFLOXACIN HCL 500 MG PO TABS: 500.0000 mg | ORAL_TABLET | Freq: Two times a day (BID) | ORAL | Status: DC

## 2013-09-16 NOTE — Plan of Care (Signed)
Problem: Discharge Progression Outcomes Goal: Activity appropriate for discharge plan Outcome: Adequate for Discharge Pt d/c home with family in stable condition Goal: Other Discharge Outcomes/Goals Outcome: Adequate for Discharge D/c instructions given to patient and husband both verbalized understanding of instructions.

## 2013-09-16 NOTE — Discharge Summary (Signed)
Physician Discharge Summary  Patient ID: Danielle [name] MRN: [id number] DOB/AGE: 1930-11-01 80 y.o. Primary Care Physician:DONDIEGO,RICHARD M, MD Admit date: 09/13/2013 Discharge date: 09/16/2013    Discharge Diagnoses:  1. Splenic infarcts. 2. Paroxysmal atrial fibrillation, patient now on anticoagulation. 3. UTI, gram-negative rods. Identification and sensitivities pending. 4. Hypertension. 5. Post shingles neuropathic pain.     Medication List    STOP taking these medications       amLODipine 5 MG tablet  Commonly known as:  NORVASC     metoprolol tartrate 25 MG tablet  Commonly known as:  LOPRESSOR      TAKE these medications       ALIGN PO  Take 1 tablet by mouth daily.     apixaban 5 MG Tabs tablet  Commonly known as:  ELIQUIS  Take 1 tablet (5 mg total) by mouth 2 (two) times daily.     capsaicin 0.025 % cream  Commonly known as:  ZOSTRIX  Apply topically 2 (two) times daily.     ciprofloxacin 500 MG tablet  Commonly known as:  CIPRO  Take 1 tablet (500 mg total) by mouth 2 (two) times daily.     diazepam 5 MG tablet  Commonly known as:  VALIUM  Take 2.5 mg by mouth 2 (two) times daily as needed for anxiety.     dicyclomine 10 MG capsule  Commonly known as:  BENTYL  Take 1 capsule (10 mg total) by mouth 4 (four) times daily -  before meals and at bedtime.     levothyroxine 100 MCG tablet  Commonly known as:  SYNTHROID, LEVOTHROID  Take 100 mcg by mouth daily. Do not take synthroid on Mon and Thursdays     lisinopril 10 MG tablet  Commonly known as:  PRINIVIL,ZESTRIL  Take 1 tablet (10 mg total) by mouth daily.     megestrol 400 MG/10ML suspension  Commonly known as:  MEGACE  Take 10 mLs (400 mg total) by mouth daily.     metoprolol succinate 12.5 mg Tb24 24 hr tablet  Commonly known as:  TOPROL-XL  Take 0.5 tablets (12.5 mg total) by mouth daily.     nystatin cream  Commonly known as:  MYCOSTATIN  Apply topically 2 (two) times daily.      PERCOCET 7.5-325 MG per tablet  Generic drug:  oxyCODONE-acetaminophen  Take 1 tablet by mouth every 4 (four) hours as needed for pain.     Potassium Chloride ER 20 MEQ Tbcr  Take 20 mEq by mouth daily.     pregabalin 300 MG capsule  Commonly known as:  LYRICA  Take 300 mg by mouth daily.     promethazine 25 MG tablet  Commonly known as:  PHENERGAN  Take 25 mg by mouth every 6 (six) hours as needed for nausea or vomiting.     ranitidine 150 MG capsule  Commonly known as:  ZANTAC  Take 150 mg by mouth 2 (two) times daily.     saccharomyces boulardii 250 MG capsule  Commonly known as:  FLORASTOR  Take 1 capsule (250 mg total) by mouth 2 (two) times daily.        Discharged Condition: Stable.    Consults: Cardiology, Dr. Harl Bowie.  Significant Diagnostic Studies: Ct Abdomen Pelvis W Contrast  09/13/2013   CLINICAL DATA:  Worsening abdominal pain radiating to low back.  EXAM: CT ABDOMEN AND PELVIS WITH CONTRAST  TECHNIQUE: Multidetector CT imaging of the abdomen and pelvis was performed using the standard  protocol following bolus administration of intravenous contrast.  CONTRAST:  135mL OMNIPAQUE IOHEXOL 300 MG/ML  SOLN  COMPARISON:  06/22/2013  FINDINGS: Previous seen bilateral pleural effusions have resolved. Geographic area of decreased enhancement is seen within the spleen, which is consistent with acute splenic infarct. No evidence of splenomegaly, perisplenic hematoma, or hemoperitoneum.  Tiny left hepatic lobe cyst remains stable and no liver masses are identified. Gallbladder is unremarkable. The pancreas, adrenal glands, and right kidney are normal in appearance. A small left renal cysts again noted, however there is no evidence of renal masses or hydronephrosis.  Severe diverticulosis seen involving the sigmoid colon. Sigmoid colonic wall thickening remains stable with minimal stranding in the adjacent pericolonic fat. This may represent chronic muscular hypertrophy secondary  to diverticulosis, although mild diverticulitis cannot be excluded. A soft tissue density with peripheral calcifications again seen along the posterior wall of the sigmoid colon which is stable and likely due to chronic inflammation/fat necrosis. No evidence of bowel obstruction, abscess, or extraluminal gas collection.  A small midline epigastric ventral hernia is again seen containing only omental fat. No evidence herniated bowel loops. Tiny uterine fibroids again noted. Adnexal regions are unremarkable in appearance.  IMPRESSION: Acute splenic infarct.  Stable appearance of severe sigmoid diverticular disease, with chronic muscular hypertrophy versus chronic diverticulitis. No evidence of abscess or other acute complication.  Stable small epigastric ventral hernia containing only fat and tiny uterine fibroids.   Electronically Signed   By: Earle Gell M.D.   On: 09/13/2013 16:04   Dg Chest Portable 1 View  09/13/2013   CLINICAL DATA:  Chest and left arm pain.  Back pain.  EXAM: PORTABLE CHEST - 1 VIEW  COMPARISON:  06/19/2013  FINDINGS: Moderate cardiomegaly stable. Diffuse interstitial edema has resolved since previous study. No definite pleural effusion seen on this portable exam.  IMPRESSION: Stable cardiomegaly. Resolution of interstitial edema since prior exam. No acute findings.   Electronically Signed   By: Earle Gell M.D.   On: 09/13/2013 13:52    Lab Results: Basic Metabolic Panel:  Recent Labs  09/13/13 1316 09/14/13 0546  NA 137 142  K 3.3* 3.7  CL 98 105  CO2 25 25  GLUCOSE 126* 91  BUN 12 13  CREATININE 0.73 0.85  CALCIUM 8.6 8.3*   Liver Function Tests:    CBC:  Recent Labs  09/13/13 1316  09/15/13 0137 09/16/13 0629  WBC 14.3*  < > 10.8* 6.7  NEUTROABS 11.0*  --   --   --   HGB 13.8  < > 10.3* 9.7*  HCT 41.4  < > 30.4* 28.8*  MCV 88.8  < > 88.6 88.9  PLT 300  < > 207 207  < > = values in this interval not displayed.  Recent Results (from the past 240  hour(s))  URINE CULTURE     Status: None   Collection Time    09/13/13  2:15 PM      Result Value Ref Range Status   Specimen Description URINE, CLEAN CATCH   Final   Special Requests NONE   Final   Culture  Setup Time     Final   Value: 09/13/2013 20:40     Performed at [facility]     Final   Value: >=100,000 COLONIES/ML     Performed at [facility]   Culture     Final   Value: [place]  Performed at [facility] Hospital Course: This 80 year old lady presented to the hospital with symptoms of abdominal pain. Please see initial history as outlined below: HPI:  80 year old female with history of hypothyroidism, thyroid cancer, hospitalization few months back for diverticulitis, C. difficile colitis with elevated troponin and new onset A. fib , history of shingles and? Lupus who presented to the ED with acute onset of abdominal pain since yesterday evening. Patient reports that the pain started on the bilateral hips and radiated to the back however her husband present at bedside informs that patient complained of pain over her left upper quadrant. She appears slightly confused last night as well. Patient reports that the pain over her left upper quadrant was possibly from the shingles she had underneath her left breast for possible weeks. She describes the pain to be stabbing without any aggravating or relieving factors. She denies similar symptoms in the past. She denies any nausea or vomiting. She denies any diarrhea or dysuria. She denies any fever or chills. Denies any headache, dizziness, chest pain, palpitations, shortness of breath.  Course in the ED  Patient was febrile to 100.61F, tachycardic to 110s, tachypneic upper 30s with normal blood pressure. Blood work done showed a WBC of 14.2 thousand with normal hemoglobin and platelets. Chemistry showed potassium of 3.3.  A chest x-ray done was  unremarkable. EKG done showed sinus tachycardia with multiple PVCs and ordered a bundle branch block. UA was positive for UTI.  Given severe abdominal pain a CT scan of the abdomen and pelvis was done which showed of 2 splenic infarct and chronic severe sigmoid diverticulosis without diverticulitis.  Patient given a dose of IV fentanyl and IV Rocephin in the ED. Vascular surgery was consulted by ED physician given the splenic infarct and recommended conservative management with pain control and anticoagulation. Patient was given a therapeutic dose of subcutaneous Lovenox.  Hospitalists called for admission to telemetry.  On my evaluation patient denied any abdominal pain as she had already received pain medications. The patient also was found to have a UTI, gram-negative rods, identification and sensitivities pending. She had been started on intravenous Rocephin. She will be discharged home on oral ciprofloxacin. Cardiology was consulted. Vascular surgery was consulted. Vascular surgery felt that this could be managed conservatively and suggested anticoagulation. It was felt that her splenic infarcts are related to atrial fibrillation. Cardiology recommended Eliquis for long-term anticoagulation. She is now stable for discharge and will followup with cardiology and her primary care physician soon.  Discharge Exam: Blood pressure 118/61, pulse 58, temperature 97.4 F (36.3 C), temperature source Oral, resp. rate 18, height 5\' 6"  (1.676 m), weight 65.5 kg (144 lb 6.4 oz), SpO2 100.00%. She looks systemically well. She is not in pain. Abdomen is soft nontender. Heart sounds appear to be in sinus rhythm this morning. Lung fields are clear. She is alert and oriented.  Disposition: Home.      Discharge Orders   Future Orders Complete By Expires   Diet - low sodium heart healthy  As directed    Increase activity slowly  As directed       Follow-up Information   Follow up with Carlyle Dolly, F,  MD. Schedule an appointment as soon as possible for a visit in 1 week.   Specialty:  Cardiology   Contact information:   [address] [place] [address] [phone]  Follow up with [name], MD. Schedule an appointment as soon as possible for a visit in 2 weeks.   Specialty:  Internal Medicine   Contact information:   [place] [address] [phone]       Signed: Doree Albee   09/16/2013, 7:45 AM

## 2013-09-16 NOTE — Discharge Instructions (Signed)
Information on my medicine - ELIQUIS (apixaban)  This medication education was reviewed with me or my healthcare representative as part of my discharge preparation.  The pharmacist that spoke with me during my hospital stay was:  Ena Dawley, [facility]  Why was Eliquis prescribed for you? Eliquis was prescribed to treat blood clots that may have been found in the veins of your legs (deep vein thrombosis) or in your lungs (pulmonary embolism) and to reduce the risk of them occurring again.  What do You need to know about Eliquis ? The starting dose is 5 mg tablet taken TWICE daily.  Eliquis may be taken with or without food.   Try to take the dose about the same time in the morning and in the evening. If you have difficulty swallowing the tablet whole please discuss with your pharmacist how to take the medication safely.  Take Eliquis exactly as prescribed and DO NOT stop taking Eliquis without talking to the doctor who prescribed the medication.  Stopping may increase your risk of developing a new blood clot.  Refill your prescription before you run out.  After discharge, you should have regular check-up appointments with your healthcare provider that is prescribing your Eliquis.    What do you do if you miss a dose? If a dose of ELIQUIS is not taken at the scheduled time, take it as soon as possible on the same day and twice-daily administration should be resumed. The dose should not be doubled to make up for a missed dose.  Important Safety Information A possible side effect of Eliquis is bleeding. You should call your healthcare provider right away if you experience any of the following:   Bleeding from an injury or your nose that does not stop.   Unusual colored urine (red or dark brown) or unusual colored stools (red or black).   Unusual bruising for unknown reasons.   A serious fall or if you hit your head (even if there is no bleeding).  Some medicines may interact with  Eliquis and might increase your risk of bleeding or clotting while on Eliquis. To help avoid this, consult your healthcare provider or pharmacist prior to using any new prescription or non-prescription medications, including herbals, vitamins, non-steroidal anti-inflammatory drugs (NSAIDs) and supplements.  This website has more information on Eliquis (apixaban): [email or web address].

## 2013-09-17 LAB — PROTEIN S, TOTAL: PROTEIN S AG TOTAL: 73 % (ref 60–150)

## 2013-09-17 LAB — PROTEIN C ACTIVITY: Protein C Activity: 81 % (ref 75–133)

## 2013-09-17 LAB — PROTEIN C, TOTAL: PROTEIN C, TOTAL: 67 % — AB (ref 72–160)

## 2013-09-17 LAB — PROTEIN S ACTIVITY: PROTEIN S ACTIVITY: 69 % (ref 69–129)

## 2013-09-18 LAB — LUPUS ANTICOAGULANT PANEL
DRVVT: 36.9 secs (ref <42.9)
LUPUS ANTICOAGULANT: DETECTED — AB
PTT Lupus Anticoagulant: 87.8 secs — ABNORMAL HIGH (ref 28.0–43.0)
PTTLA 41 MIX: 75.1 s — AB (ref 28.0–43.0)
PTTLA Confirmation: 19.5 secs — ABNORMAL HIGH (ref <8.0)

## 2013-09-18 LAB — PROTHROMBIN GENE MUTATION

## 2013-09-18 LAB — FACTOR 5 LEIDEN

## 2013-09-23 ENCOUNTER — Encounter: Payer: Medicare Other | Admitting: Adult Health

## 2013-09-23 ENCOUNTER — Encounter: Payer: Self-pay | Admitting: *Deleted

## 2013-10-20 ENCOUNTER — Telehealth (INDEPENDENT_AMBULATORY_CARE_PROVIDER_SITE_OTHER): Payer: Self-pay | Admitting: *Deleted

## 2013-10-20 NOTE — Telephone Encounter (Signed)
Danielle [name] doesn't want to schedule a f/u apt at this time. She has the singles, also been in and out of the hospital. She will call back once she gets to feeling better.

## 2013-10-20 NOTE — Telephone Encounter (Signed)
noted 

## 2014-02-25 ENCOUNTER — Encounter (HOSPITAL_COMMUNITY): Payer: Self-pay | Admitting: Emergency Medicine

## 2014-02-25 ENCOUNTER — Inpatient Hospital Stay (HOSPITAL_COMMUNITY)
Admission: EM | Admit: 2014-02-25 | Discharge: 2014-02-26 | DRG: 074 | Disposition: A | Discharge status: Home / Self Care | Payer: Medicare Other | Attending: Family Medicine | Admitting: Family Medicine

## 2014-02-25 ENCOUNTER — Emergency Department (HOSPITAL_COMMUNITY): Payer: Medicare Other

## 2014-02-25 DIAGNOSIS — I447 Left bundle-branch block, unspecified: Secondary | ICD-10-CM | POA: Diagnosis present

## 2014-02-25 DIAGNOSIS — I10 Essential (primary) hypertension: Secondary | ICD-10-CM | POA: Diagnosis present

## 2014-02-25 DIAGNOSIS — H3530 Unspecified macular degeneration: Secondary | ICD-10-CM | POA: Diagnosis present

## 2014-02-25 DIAGNOSIS — K529 Noninfective gastroenteritis and colitis, unspecified: Secondary | ICD-10-CM | POA: Diagnosis present

## 2014-02-25 DIAGNOSIS — G894 Chronic pain syndrome: Secondary | ICD-10-CM | POA: Diagnosis present

## 2014-02-25 DIAGNOSIS — E079 Disorder of thyroid, unspecified: Secondary | ICD-10-CM

## 2014-02-25 DIAGNOSIS — Z79899 Other long term (current) drug therapy: Secondary | ICD-10-CM | POA: Diagnosis not present

## 2014-02-25 DIAGNOSIS — A0472 Enterocolitis due to Clostridium difficile, not specified as recurrent: Secondary | ICD-10-CM

## 2014-02-25 DIAGNOSIS — R112 Nausea with vomiting, unspecified: Secondary | ICD-10-CM

## 2014-02-25 DIAGNOSIS — R778 Other specified abnormalities of plasma proteins: Secondary | ICD-10-CM

## 2014-02-25 DIAGNOSIS — R531 Weakness: Secondary | ICD-10-CM

## 2014-02-25 DIAGNOSIS — R8271 Bacteriuria: Secondary | ICD-10-CM

## 2014-02-25 DIAGNOSIS — Z85850 Personal history of malignant neoplasm of thyroid: Secondary | ICD-10-CM

## 2014-02-25 DIAGNOSIS — B0229 Other postherpetic nervous system involvement: Principal | ICD-10-CM | POA: Diagnosis present

## 2014-02-25 DIAGNOSIS — R7989 Other specified abnormal findings of blood chemistry: Secondary | ICD-10-CM

## 2014-02-25 DIAGNOSIS — D735 Infarction of spleen: Secondary | ICD-10-CM

## 2014-02-25 DIAGNOSIS — M1990 Unspecified osteoarthritis, unspecified site: Secondary | ICD-10-CM | POA: Diagnosis present

## 2014-02-25 DIAGNOSIS — R079 Chest pain, unspecified: Secondary | ICD-10-CM

## 2014-02-25 DIAGNOSIS — K5732 Diverticulitis of large intestine without perforation or abscess without bleeding: Secondary | ICD-10-CM

## 2014-02-25 DIAGNOSIS — F419 Anxiety disorder, unspecified: Secondary | ICD-10-CM | POA: Diagnosis present

## 2014-02-25 DIAGNOSIS — E876 Hypokalemia: Secondary | ICD-10-CM

## 2014-02-25 DIAGNOSIS — I4891 Unspecified atrial fibrillation: Secondary | ICD-10-CM | POA: Diagnosis present

## 2014-02-25 DIAGNOSIS — Z8619 Personal history of other infectious and parasitic diseases: Secondary | ICD-10-CM

## 2014-02-25 DIAGNOSIS — Z8719 Personal history of other diseases of the digestive system: Secondary | ICD-10-CM

## 2014-02-25 DIAGNOSIS — R197 Diarrhea, unspecified: Secondary | ICD-10-CM

## 2014-02-25 DIAGNOSIS — R42 Dizziness and giddiness: Secondary | ICD-10-CM

## 2014-02-25 DIAGNOSIS — E43 Unspecified severe protein-calorie malnutrition: Secondary | ICD-10-CM

## 2014-02-25 DIAGNOSIS — B029 Zoster without complications: Secondary | ICD-10-CM | POA: Diagnosis not present

## 2014-02-25 LAB — APTT: aPTT: 46 seconds — ABNORMAL HIGH (ref 24–37)

## 2014-02-25 LAB — URINALYSIS, ROUTINE W REFLEX MICROSCOPIC
Bilirubin Urine: NEGATIVE
Glucose, UA: NEGATIVE mg/dL
Ketones, ur: NEGATIVE mg/dL
Leukocytes, UA: NEGATIVE
NITRITE: NEGATIVE
PH: 5.5 (ref 5.0–8.0)
Protein, ur: NEGATIVE mg/dL
SPECIFIC GRAVITY, URINE: 1.02 (ref 1.005–1.030)
UROBILINOGEN UA: 0.2 mg/dL (ref 0.0–1.0)

## 2014-02-25 LAB — COMPREHENSIVE METABOLIC PANEL
ALT: 16 U/L (ref 0–35)
ANION GAP: 11 (ref 5–15)
AST: 15 U/L (ref 0–37)
Albumin: 3.2 g/dL — ABNORMAL LOW (ref 3.5–5.2)
Alkaline Phosphatase: 127 U/L — ABNORMAL HIGH (ref 39–117)
BILIRUBIN TOTAL: 0.7 mg/dL (ref 0.3–1.2)
BUN: 14 mg/dL (ref 6–23)
CHLORIDE: 97 meq/L (ref 96–112)
CO2: 29 mEq/L (ref 19–32)
Calcium: 8.9 mg/dL (ref 8.4–10.5)
Creatinine, Ser: 0.77 mg/dL (ref 0.50–1.10)
GFR calc Af Amer: 89 mL/min — ABNORMAL LOW (ref >90)
GFR calc non Af Amer: 77 mL/min — ABNORMAL LOW (ref >90)
Glucose, Bld: 110 mg/dL — ABNORMAL HIGH (ref 70–99)
Potassium: 3.5 mEq/L — ABNORMAL LOW (ref 3.7–5.3)
SODIUM: 137 meq/L (ref 137–147)
Total Protein: 7.4 g/dL (ref 6.0–8.3)

## 2014-02-25 LAB — CBC WITH DIFFERENTIAL/PLATELET
Basophils Absolute: 0 10*3/uL (ref 0.0–0.1)
Basophils Relative: 0 % (ref 0–1)
Eosinophils Absolute: 0 10*3/uL (ref 0.0–0.7)
Eosinophils Relative: 0 % (ref 0–5)
HCT: 36.7 % (ref 36.0–46.0)
HEMOGLOBIN: 11.7 g/dL — AB (ref 12.0–15.0)
LYMPHS ABS: 1.4 10*3/uL (ref 0.7–4.0)
Lymphocytes Relative: 8 % — ABNORMAL LOW (ref 12–46)
MCH: 26.5 pg (ref 26.0–34.0)
MCHC: 31.9 g/dL (ref 30.0–36.0)
MCV: 83.2 fL (ref 78.0–100.0)
MONOS PCT: 10 % (ref 3–12)
Monocytes Absolute: 1.8 10*3/uL — ABNORMAL HIGH (ref 0.1–1.0)
NEUTROS ABS: 13.9 10*3/uL — AB (ref 1.7–7.7)
NEUTROS PCT: 82 % — AB (ref 43–77)
PLATELETS: 552 10*3/uL — AB (ref 150–400)
RBC: 4.41 MIL/uL (ref 3.87–5.11)
RDW: 16.6 % — ABNORMAL HIGH (ref 11.5–15.5)
WBC: 17.2 10*3/uL — ABNORMAL HIGH (ref 4.0–10.5)

## 2014-02-25 LAB — URINE MICROSCOPIC-ADD ON

## 2014-02-25 LAB — TROPONIN I
Troponin I: <0.3 ng/mL (ref <0.30)
Troponin I: <0.3 ng/mL (ref <0.30)

## 2014-02-25 LAB — PROTIME-INR
INR: 1.46 (ref 0.00–1.49)
PROTHROMBIN TIME: 17.9 s — AB (ref 11.6–15.2)

## 2014-02-25 MED ORDER — PREGABALIN 50 MG PO CAPS
50.0000 mg | ORAL_CAPSULE | Freq: Three times a day (TID) | ORAL | Status: DC
  Administered 2014-02-25 – 2014-02-26 (×2): 50 mg via ORAL
  Filled 2014-02-25 (×2): qty 1

## 2014-02-25 MED ORDER — ONDANSETRON HCL 4 MG PO TABS: 4.0000 mg | ORAL_TABLET | Freq: Four times a day (QID) | ORAL | Status: DC | PRN

## 2014-02-25 MED ORDER — SODIUM CHLORIDE 0.9 % IV SOLN
INTRAVENOUS | Status: DC
  Administered 2014-02-25 – 2014-02-26 (×2): via INTRAVENOUS

## 2014-02-25 MED ORDER — SODIUM CHLORIDE 0.9 % IV SOLN: INTRAVENOUS | Status: DC

## 2014-02-25 MED ORDER — ONDANSETRON HCL 4 MG/2ML IJ SOLN
4.0000 mg | Freq: Once | INTRAMUSCULAR | Status: AC
  Administered 2014-02-25: 4 mg via INTRAVENOUS
  Filled 2014-02-25: qty 2

## 2014-02-25 MED ORDER — ASPIRIN 81 MG PO CHEW
324.0000 mg | CHEWABLE_TABLET | Freq: Once | ORAL | Status: AC
  Administered 2014-02-25: 324 mg via ORAL
  Filled 2014-02-25: qty 4

## 2014-02-25 MED ORDER — LEVOTHYROXINE SODIUM 100 MCG PO TABS: 100.0000 ug | ORAL_TABLET | ORAL | Status: DC

## 2014-02-25 MED ORDER — AMLODIPINE BESYLATE 5 MG PO TABS
5.0000 mg | ORAL_TABLET | Freq: Every day | ORAL | Status: DC
  Administered 2014-02-26: 5 mg via ORAL
  Filled 2014-02-25: qty 1

## 2014-02-25 MED ORDER — SODIUM CHLORIDE 0.9 % IV SOLN
Freq: Once | INTRAVENOUS | Status: AC
  Administered 2014-02-25: 17:00:00 via INTRAVENOUS

## 2014-02-25 MED ORDER — FAMOTIDINE 20 MG PO TABS
20.0000 mg | ORAL_TABLET | Freq: Two times a day (BID) | ORAL | Status: DC
  Administered 2014-02-25 – 2014-02-26 (×2): 20 mg via ORAL
  Filled 2014-02-25 (×2): qty 1

## 2014-02-25 MED ORDER — METOPROLOL SUCCINATE ER 25 MG PO TB24: 12.5000 mg | ORAL_TABLET | Freq: Every day | ORAL | Status: DC

## 2014-02-25 MED ORDER — ONDANSETRON HCL 4 MG/2ML IJ SOLN
4.0000 mg | Freq: Four times a day (QID) | INTRAMUSCULAR | Status: DC | PRN
Start: 2014-02-25 — End: 2014-02-26

## 2014-02-25 MED ORDER — METOPROLOL SUCCINATE ER 25 MG PO TB24
12.5000 mg | ORAL_TABLET | Freq: Every day | ORAL | Status: DC
  Administered 2014-02-26: 12.5 mg via ORAL
  Filled 2014-02-25: qty 1

## 2014-02-25 MED ORDER — LISINOPRIL 10 MG PO TABS
10.0000 mg | ORAL_TABLET | Freq: Every day | ORAL | Status: DC
  Administered 2014-02-26: 10 mg via ORAL
  Filled 2014-02-25: qty 1

## 2014-02-25 MED ORDER — HYDROCODONE-ACETAMINOPHEN 5-325 MG PO TABS
1.0000 | ORAL_TABLET | ORAL | Status: DC | PRN
  Administered 2014-02-25 – 2014-02-26 (×2): 1 via ORAL
  Filled 2014-02-25 (×2): qty 1

## 2014-02-25 MED ORDER — ALPRAZOLAM 0.25 MG PO TABS
0.2500 mg | ORAL_TABLET | Freq: Three times a day (TID) | ORAL | Status: DC | PRN
  Administered 2014-02-25: 0.25 mg via ORAL
  Filled 2014-02-25: qty 1

## 2014-02-25 MED ORDER — ONDANSETRON HCL 4 MG/2ML IJ SOLN: 4.0000 mg | Freq: Three times a day (TID) | INTRAMUSCULAR | Status: AC | PRN

## 2014-02-25 MED ORDER — SODIUM CHLORIDE 0.9 % IJ SOLN
3.0000 mL | Freq: Two times a day (BID) | INTRAMUSCULAR | Status: DC
  Administered 2014-02-26: 3 mL via INTRAVENOUS

## 2014-02-25 MED ORDER — APIXABAN 5 MG PO TABS
5.0000 mg | ORAL_TABLET | Freq: Two times a day (BID) | ORAL | Status: DC
  Administered 2014-02-25 – 2014-02-26 (×2): 5 mg via ORAL
  Filled 2014-02-25 (×2): qty 1

## 2014-02-25 NOTE — ED Provider Notes (Signed)
80 year old female, history of paroxysmal atrial fibrillation and a left bundle branch block who had admitted to the hospital over the last year because of splenic infarct, had incidental C. difficile colitis and was unable to have a stress test at that time. She presents to the hospital today with approximately 18 hours of nausea vomiting and diarrhea. The diarrhea is described as loose, nonbloody and improving after taking multiple doses of Pepto-Bismol. She denies any fevers chills coughing, but does endorse a small amount of left-sided chest pain or shortness of breath and left arm abnormal feeling. This has gradually improved but is still present and mild. She denies any swelling of the legs, she has been taking her medication including her antihypertensives as well as her anticoagulants. Her EKG shows left bundle branch block, this is unchanged from prior, she is a normal sinus rhythm. She will need evaluation for dehydration, source of the diarrhea though likely this is a benign source, troponin, the patient has not had any provocative testing.  Troponin unremarkable, white blood cell count unexplained exemia gastrointestinal illness, chest x-ray without acute findings. EKG without acute ischemia the left bundle-branch block couldn't secure underlying findings. The patient will be admitted for rule out, I discussed with hospitalist who agrees.   EKG Interpretation  Date/Time:  Wednesday February 25 2014 16:04:57 EDT Ventricular Rate:  74 PR Interval:  144 QRS Duration: 140 QT Interval:  426 QTC Calculation: 472 R Axis:   -12 Text Interpretation:  Normal sinus rhythm Possible Left atrial enlargement Left bundle branch block Abnormal ECG since last tracing no significant change Confirmed by Kathalina Ostermann  MD, Jazmin Vensel ([id number]) on 02/25/2014 4:10:36 PM      Medical screening examination/treatment/procedure(s) were conducted as a shared visit with non-physician practitioner(s) and myself.  I personally  evaluated the patient during the encounter.  Clinical Impression:   Final diagnoses:  Nausea, vomiting and diarrhea  Chest pain, unspecified chest pain type         Johnna Acosta, MD 02/27/14 [phone]

## 2014-02-25 NOTE — ED Notes (Signed)
N/v/d and left arm pain starting last night.  Denies cp.  C/o mild sob.

## 2014-02-25 NOTE — H&P (Signed)
PCP:   [name], MD   Chief Complaint:  Chest pain  HPI:  80 year old female who   has a past medical history of Osteoarthritis; Macular degeneration; Hypertension; Thyroid disease; Thyroid cancer; GI bleed; Atrial fibrillation; C. difficile colitis; Diverticulitis; Shingles; and Lupus. Today presents to the hospital with chief complaint of nausea vomiting and diarrhea which started yesterday, patient took Pepto-Bismol and diarrhea resolved. But today she also experienced left-sided chest pain with radiation to left arm to she got concerned and came to the ED. The chest pain is now resolved she does have chronic left sided pain from postherpetic neuralgia. She denies passing out, no slurred speech, no dysuria no fever. She admits to having some shortness of breath. Patient had 2 episodes of vomiting at home, denies vomiting at this time. In the ED patient was found to have allergen white count of 17,000, UA was clear, chest x-ray did not show pneumonia. EKG showed left bundle branch block, as  seen on previous EKG in May 2015. Patient has a history of C. difficile quite is in the past.   Allergies:   Allergies  Allergen Reactions  . Thyroid Hormones     proparathyroid thyroid medication      Past Medical History  Diagnosis Date  . Osteoarthritis   . Macular degeneration   . Hypertension   . Thyroid disease   . Thyroid cancer   . GI bleed   . Atrial fibrillation   . C. difficile colitis   . Diverticulitis   . Shingles   . Lupus     Past Surgical History  Procedure Laterality Date  . Thyroidectomy    . Colonoscopy  03/17/2011    Procedure: COLONOSCOPY;  Surgeon: Rogene Houston, MD;  Location: [facility];  Service: Endoscopy;  Laterality: N/A;  . Cyst removal neck      Prior to Admission medications   Medication Sig Start Date End Date Taking? Authorizing Provider  amLODipine (NORVASC) 5 MG tablet Take 5 mg by mouth daily.   Yes Historical Provider, MD    apixaban (ELIQUIS) 5 MG TABS tablet Take 1 tablet (5 mg total) by mouth 2 (two) times daily. 09/16/13  Yes Nimish Luther Parody, MD  capsaicin (ZOSTRIX) 0.025 % cream Apply topically 2 (two) times daily. 09/16/13  Yes Nimish Luther Parody, MD  HYDROcodone-acetaminophen (NORCO/VICODIN) 5-325 MG per tablet Take 1 tablet by mouth every 5 (five) hours as needed (pain).   Yes Historical Provider, MD  levothyroxine (SYNTHROID, LEVOTHROID) 100 MCG tablet Take 100 mcg by mouth daily. Do not take synthroid on Mon and Thursdays   Yes Historical Provider, MD  lisinopril (PRINIVIL,ZESTRIL) 10 MG tablet Take 1 tablet (10 mg total) by mouth daily. 09/16/13  Yes Nimish Luther Parody, MD  metoprolol succinate (TOPROL-XL) 25 MG 24 hr tablet Take 12.5 mg by mouth daily.   Yes Historical Provider, MD  pregabalin (LYRICA) 50 MG capsule Take 50 mg by mouth 3 (three) times daily.   Yes Historical Provider, MD  Probiotic Product (ALIGN PO) Take 1 tablet by mouth daily.   Yes Historical Provider, MD  Red Yeast Rice Extract (RED YEAST RICE PO) Take 1 capsule by mouth daily.   Yes Historical Provider, MD  ranitidine (ZANTAC) 150 MG capsule Take 150 mg by mouth 2 (two) times daily.    Historical Provider, MD    Social History:  reports that she has never smoked. She has never used smokeless tobacco. She reports that she does  not drink alcohol or use illicit drugs.  Family History  Problem Relation Age of Onset  . Tuberculosis Mother   . Thrombosis Father   . Deep vein thrombosis Sister   . Deep vein thrombosis Father      All the positives are listed in BOLD  Review of Systems:  HEENT: Headache, blurred vision, runny nose, sore throat Neck: Hypothyroidism, hyperthyroidism,,lymphadenopathy Chest : Shortness of breath, history of COPD, Asthma Heart : Chest pain, history of coronary arterey disease GI:  Nausea, vomiting, diarrhea, constipation, GERD GU: Dysuria, urgency, frequency of urination, hematuria Neuro: Stroke,  seizures, syncope Psych: Depression, anxiety, hallucinations   Physical Exam: Blood pressure 158/61, pulse 82, temperature 99.2 F (37.3 C), temperature source Oral, resp. rate 18, height 5\' 7"  (1.702 m), weight 63.504 kg (140 lb), SpO2 89.00%. Constitutional:   Patient is a well-developed and well-nourished *female in no acute distress and cooperative with exam. Head: Normocephalic and atraumatic Mouth: Mucus membranes moist Eyes: PERRL, EOMI, conjunctivae normal Neck: Supple, No Thyromegaly Cardiovascular: RRR, S1 normal, S2 normal Pulmonary/Chest: CTAB, no wheezes, rales, or rhonchi Abdominal: Soft. Non-tender, non-distended, bowel sounds are normal, no masses, organomegaly, or guarding present.  Neurological: A&O x3, Strenght is normal and symmetric bilaterally, cranial nerve II-XII are grossly intact, no focal motor deficit, sensory intact to light touch bilaterally.  Extremities : No Cyanosis, Clubbing or Edema  Labs on Admission:  Basic Metabolic Panel:  Recent Labs Lab 02/25/14 1625  NA 137  K 3.5*  CL 97  CO2 29  GLUCOSE 110*  BUN 14  CREATININE 0.77  CALCIUM 8.9   Liver Function Tests:  Recent Labs Lab 02/25/14 1625  AST 15  ALT 16  ALKPHOS 127*  BILITOT 0.7  PROT 7.4  ALBUMIN 3.2*   No results found for this basename: LIPASE, AMYLASE,  in the last 168 hours No results found for this basename: AMMONIA,  in the last 168 hours CBC:  Recent Labs Lab 02/25/14 1625  WBC 17.2*  NEUTROABS 13.9*  HGB 11.7*  HCT 36.7  MCV 83.2  PLT 552*   Cardiac Enzymes:  Recent Labs Lab 02/25/14 1625  TROPONINI <0.30    BNP (last 3 results) No results found for this basename: PROBNP,  in the last 8760 hours CBG: No results found for this basename: GLUCAP,  in the last 168 hours  Radiological Exams on Admission: Dg Chest Portable 1 View  02/25/2014   CLINICAL DATA:  Nausea  EXAM: PORTABLE CHEST - 1 VIEW  COMPARISON:  09/13/2013  FINDINGS: Cardiac  enlargement without heart failure. Lungs are clear without infiltrate or effusion. Negative for pneumonia.  IMPRESSION: No active disease.   Electronically Signed   By: Franchot Gallo M.D.   On: 02/25/2014 16:56    EKG: Independently reviewed. Left bundle branch block, normal sinus rhythm.   Assessment/Plan Active Problems:   Thyroid disease   Atrial fibrillation   Diarrhea   Splenic infarct   LBBB (left bundle branch block)   Chest pain   Gastroenteritis   ?Gastroenteritis Patient presented with nausea vomiting and diarrhea, symptoms have resolved at this time. We'll start Zofran when necessary and start full liquid diet.  Diarrhea Resolved after patient took Pepto-Bismol, Imodium. She has a history of C. difficile colitis the past. Stool for C. difficile has been ordered.  Chest pain Patient came with chest pain with left side arm radiation, will admit on telemetry monitor cardiac enzymes every 6 hours.  History of splenic infarct Continue anticoagulation  with Eliquis.  Chronic pain syndrome Continue Vicodin when necessary  Anxiety We'll start the next 0.25 every 8 hours when necessary.   Code status: Patient is a full  code  Family discussion: Admission, patients condition and plan of care including tests being ordered have been discussed with the patient and her husband at bedside* who indicate understanding and agree with the plan and Code Status.   Time Spent on Admission: 60 minutes  [facility] Pager: [phone] 02/25/2014, 8:18 PM  If 7PM-7AM, please contact night-coverage  [email or web address]  Password [email or web address]

## 2014-02-25 NOTE — ED Notes (Signed)
Patient transferred to bedside chair at patient request. No other needs voiced at this time.

## 2014-02-25 NOTE — ED Provider Notes (Signed)
CSN: [id number]     Arrival date & time 02/25/14  1555 History   First MD Initiated Contact with Patient 02/25/14 1609     Chief Complaint  Patient presents with  . Nausea     (Consider location/radiation/quality/duration/timing/severity/associated sxs/prior Treatment) Patient is a 80 y.o. female presenting with abdominal pain. The history is provided by the patient and a significant other.  Abdominal Pain Pain location:  Generalized Pain quality: aching   Pain radiates to:  Does not radiate Pain severity:  Mild Onset quality:  Gradual Duration:  2 days Progression:  Waxing and waning Chronicity:  New Context: not alcohol use, not eating, not recent travel, not suspicious food intake and not trauma   Relieved by:  Nothing Worsened by:  Nothing tried Ineffective treatments:  OTC medications Associated symptoms: chills, diarrhea, fatigue, fever, nausea, shortness of breath and vomiting   Associated symptoms: no anorexia, no cough, no dysuria, no hematemesis, no hematochezia, no hematuria, no melena and no sore throat   Diarrhea:    Quality:  Watery   Number of occurrences:  3   Severity:  Mild   Duration:  2 days   Timing:  Intermittent   Progression:  Unchanged Nausea:    Severity:  Moderate   Onset quality:  Gradual   Timing:  Constant Shortness of breath:    Severity:  Mild   Onset quality:  Gradual   Duration:  1 day   Progression:  Unchanged Risk factors: being elderly   Risk factors: no alcohol abuse and has not had multiple surgeries    Danielle [name] is a 80 year-old female with a hx of diverticulitis, a-fib, HTN, LBBB, and splenic infarcts who presents to the ED with her husband c/o N/V, abd pain and diarrhea since yesterday evening. C/o diffuse abd pain that does not radiate since last night associated with nausea, vomiting and diarrhea a/w myalgias and fatigue. Reports her diarrhea has been watery and denies hematochezia or melena. Reports her vomit has been  watery and denies hematemesis. Reports her symptoms have improved slightly with peptobismol but her nausea has persisted.  Reports fever and chills overnight. Denies hematuria, dysuria or back pain. Reports she was laying in bed PTA and experienced left sided chest pain and left arm discomfort with some mild shortness of breath. She reports this lasted less than an hour and had resolved PTA at the ED. She does have a hx of a-fib and LLLB and has been taking Eliquis and was able to keep this down today. No hx of cardiac cath or stents. She currently reports only some funny feeling in her left arm but denies chest pain. Reports she took vicodin 5 mg at 1230 today. Patient was admitted in 5/15 for splenic infarcts and a-fib. She was admitted in 2/15 for c.diff and diverticulitis. Her cardiologist is Dr. Harrington Challenger.   Past Medical History  Diagnosis Date  . Osteoarthritis   . Macular degeneration   . Hypertension   . Thyroid disease   . Thyroid cancer   . GI bleed   . Atrial fibrillation   . C. difficile colitis   . Diverticulitis   . Shingles   . Lupus    Past Surgical History  Procedure Laterality Date  . Thyroidectomy    . Colonoscopy  03/17/2011    Procedure: COLONOSCOPY;  Surgeon: Rogene Houston, MD;  Location: [facility];  Service: Endoscopy;  Laterality: N/A;  . Cyst removal neck     Family History  Problem Relation Age of Onset  . Tuberculosis Mother   . Thrombosis Father   . Deep vein thrombosis Sister   . Deep vein thrombosis Father    History  Substance Use Topics  . Smoking status: Never Smoker   . Smokeless tobacco: Never Used  . Alcohol Use: No   OB History   Grav Para Term Preterm Abortions TAB SAB Ect Mult Living                 Review of Systems  Constitutional: Positive for fever, chills and fatigue.  HENT: Negative for sore throat.   Respiratory: Positive for shortness of breath. Negative for cough.   Gastrointestinal: Positive for nausea, vomiting, abdominal  pain and diarrhea. Negative for melena, hematochezia, anorexia and hematemesis.  Genitourinary: Negative for dysuria, hematuria and flank pain.  All other systems reviewed and are negative.     Allergies  Thyroid hormones  Home Medications   Prior to Admission medications   Medication Sig Start Date End Date Taking? Authorizing Provider  amLODipine (NORVASC) 5 MG tablet Take 5 mg by mouth daily.   Yes Historical Provider, MD  apixaban (ELIQUIS) 5 MG TABS tablet Take 1 tablet (5 mg total) by mouth 2 (two) times daily. 09/16/13  Yes Nimish Luther Parody, MD  capsaicin (ZOSTRIX) 0.025 % cream Apply topically 2 (two) times daily. 09/16/13  Yes Nimish Luther Parody, MD  HYDROcodone-acetaminophen (NORCO/VICODIN) 5-325 MG per tablet Take 1 tablet by mouth every 5 (five) hours as needed (pain).   Yes Historical Provider, MD  levothyroxine (SYNTHROID, LEVOTHROID) 100 MCG tablet Take 100 mcg by mouth daily. Do not take synthroid on Mon and Thursdays   Yes Historical Provider, MD  lisinopril (PRINIVIL,ZESTRIL) 10 MG tablet Take 1 tablet (10 mg total) by mouth daily. 09/16/13  Yes Nimish Luther Parody, MD  metoprolol succinate (TOPROL-XL) 25 MG 24 hr tablet Take 12.5 mg by mouth daily.   Yes Historical Provider, MD  pregabalin (LYRICA) 50 MG capsule Take 50 mg by mouth 3 (three) times daily.   Yes Historical Provider, MD  Probiotic Product (ALIGN PO) Take 1 tablet by mouth daily.   Yes Historical Provider, MD  Red Yeast Rice Extract (RED YEAST RICE PO) Take 1 capsule by mouth daily.   Yes Historical Provider, MD  ranitidine (ZANTAC) 150 MG capsule Take 150 mg by mouth 2 (two) times daily.    Historical Provider, MD   BP 158/61  Pulse 82  Temp(Src) 99.2 F (37.3 C) (Oral)  Resp 18  Ht 5\' 7"  (1.702 m)  Wt 140 lb (63.504 kg)  BMI 21.92 kg/m2  SpO2 89% Physical Exam  Constitutional: She is oriented to person, place, and time. She appears well-developed and well-nourished. No distress.  HENT:  Head:  Normocephalic and atraumatic.  Mouth/Throat: Oropharynx is clear and moist. No oropharyngeal exudate.  Eyes: Pupils are equal, round, and reactive to light.  Neck: Normal range of motion. Neck supple.  Cardiovascular: Normal rate, regular rhythm, normal heart sounds and intact distal pulses.  Exam reveals no gallop and no friction rub.   No murmur heard. Pulmonary/Chest: Effort normal and breath sounds normal. No respiratory distress. She has no wheezes. She has no rales. She exhibits no tenderness.  Abdominal: Soft. Bowel sounds are normal. She exhibits no distension and no mass. There is tenderness. There is no rebound and no guarding.  Genitourinary: Guaiac negative stool.  Guaiac negative with brown stool. Normal anal sphincter tone. Female tech chaperone present.  Lymphadenopathy:    She has no cervical adenopathy.  Neurological: She is alert and oriented to person, place, and time.  Skin: Skin is warm and dry. She is not diaphoretic.  Psychiatric: She has a normal mood and affect. Her behavior is normal.    ED Course  Procedures (including critical care time) Labs Review Labs Reviewed  URINALYSIS, ROUTINE W REFLEX MICROSCOPIC - Abnormal; Notable for the following:    Hgb urine dipstick TRACE (*)    All other components within normal limits  CBC WITH DIFFERENTIAL - Abnormal; Notable for the following:    WBC 17.2 (*)    Hemoglobin 11.7 (*)    RDW 16.6 (*)    Platelets 552 (*)    Neutrophils Relative % 82 (*)    Neutro Abs 13.9 (*)    Lymphocytes Relative 8 (*)    Monocytes Absolute 1.8 (*)    All other components within normal limits  COMPREHENSIVE METABOLIC PANEL - Abnormal; Notable for the following:    Potassium 3.5 (*)    Glucose, Bld 110 (*)    Albumin 3.2 (*)    Alkaline Phosphatase 127 (*)    GFR calc non Af Amer 77 (*)    GFR calc Af Amer 89 (*)    All other components within normal limits  APTT - Abnormal; Notable for the following:    aPTT 46 (*)    All  other components within normal limits  PROTIME-INR - Abnormal; Notable for the following:    Prothrombin Time 17.9 (*)    All other components within normal limits  CLOSTRIDIUM DIFFICILE BY PCR  TROPONIN I  URINE MICROSCOPIC-ADD ON    Imaging Review Dg Chest Portable 1 View  02/25/2014   CLINICAL DATA:  Nausea  EXAM: PORTABLE CHEST - 1 VIEW  COMPARISON:  09/13/2013  FINDINGS: Cardiac enlargement without heart failure. Lungs are clear without infiltrate or effusion. Negative for pneumonia.  IMPRESSION: No active disease.   Electronically Signed   By: Franchot Gallo M.D.   On: 02/25/2014 16:56     EKG Interpretation   Date/Time:  Wednesday February 25 2014 16:04:57 EDT Ventricular Rate:  74 PR Interval:  144 QRS Duration: 140 QT Interval:  426 QTC Calculation: 472 R Axis:   -12 Text Interpretation:  Normal sinus rhythm Possible Left atrial enlargement  Left bundle branch block Abnormal ECG since last tracing no significant  change Confirmed by MILLER  MD, BRIAN ([id number]) on 02/25/2014 4:10:36 PM      MDM   Final diagnoses:  Nausea, vomiting and diarrhea  Chest pain, unspecified chest pain type   Labs and CXR reviewed. CXR shows cardiac enlargement without heart failure, no signs of infiltrate or effusion. Mildly elevated white count possibly due to infection or N/V. Guaiac negative. EKG with old LBBB. No STEMI. Trop negative.   At [place] patient reports that she is feeling slightly improved, she has not vomited since arrival. She denies current chest pain or arm discomfort. Patient had chest pain PTA that is unexplained. Would like to rule out for ACS due to her cardiac history. Will admit to tele obs.    Discussed treatment and plan Dr. Sabra Heck who has also seen the patient.  Spoke with patient about treatment and plan.  Patient verbalizes understanding and agreement with plan.    Hanley Hays, [place] 02/25/14 2006

## 2014-02-26 LAB — COMPREHENSIVE METABOLIC PANEL
ALBUMIN: 2.4 g/dL — AB (ref 3.5–5.2)
ALK PHOS: 89 U/L (ref 39–117)
ALT: 11 U/L (ref 0–35)
AST: 9 U/L (ref 0–37)
Anion gap: 12 (ref 5–15)
BILIRUBIN TOTAL: 0.5 mg/dL (ref 0.3–1.2)
BUN: 11 mg/dL (ref 6–23)
CHLORIDE: 103 meq/L (ref 96–112)
CO2: 25 meq/L (ref 19–32)
Calcium: 7.9 mg/dL — ABNORMAL LOW (ref 8.4–10.5)
Creatinine, Ser: 0.69 mg/dL (ref 0.50–1.10)
GFR calc Af Amer: >90 mL/min (ref >90)
GFR, EST NON AFRICAN AMERICAN: 79 mL/min — AB (ref >90)
Glucose, Bld: 103 mg/dL — ABNORMAL HIGH (ref 70–99)
POTASSIUM: 3.6 meq/L — AB (ref 3.7–5.3)
SODIUM: 140 meq/L (ref 137–147)
Total Protein: 6 g/dL (ref 6.0–8.3)

## 2014-02-26 LAB — CBC
HCT: 29.6 % — ABNORMAL LOW (ref 36.0–46.0)
Hemoglobin: 9.6 g/dL — ABNORMAL LOW (ref 12.0–15.0)
MCH: 26.8 pg (ref 26.0–34.0)
MCHC: 32.4 g/dL (ref 30.0–36.0)
MCV: 82.7 fL (ref 78.0–100.0)
PLATELETS: 427 10*3/uL — AB (ref 150–400)
RBC: 3.58 MIL/uL — AB (ref 3.87–5.11)
RDW: 16.6 % — AB (ref 11.5–15.5)
WBC: 14.1 10*3/uL — AB (ref 4.0–10.5)

## 2014-02-26 LAB — TROPONIN I
Troponin I: <0.3 ng/mL (ref <0.30)
Troponin I: <0.3 ng/mL (ref <0.30)

## 2014-02-26 LAB — TSH: TSH: 0.355 u[IU]/mL (ref 0.350–4.500)

## 2014-02-26 NOTE — Discharge Summary (Signed)
Physician Discharge Summary  Patient ID: Danielle [name] MRN: [id number] DOB/AGE: Oct 15, 1930 80 y.o. Primary Care Physician:DONDIEGO,RICHARD M, MD Admit date: 02/25/2014 Discharge date: 02/26/2014    Discharge Diagnoses:  1. Left-sided chest pain, no evidence of cardiac disease. Probably related to recent shingles. 2. Gastroenteritis, resolved.     Medication List         ALIGN PO  Take 1 tablet by mouth daily.     amLODipine 5 MG tablet  Commonly known as:  NORVASC  Take 5 mg by mouth daily.     apixaban 5 MG Tabs tablet  Commonly known as:  ELIQUIS  Take 1 tablet (5 mg total) by mouth 2 (two) times daily.     capsaicin 0.025 % cream  Commonly known as:  ZOSTRIX  Apply topically 2 (two) times daily.     HYDROcodone-acetaminophen 5-325 MG per tablet  Commonly known as:  NORCO/VICODIN  Take 1 tablet by mouth every 5 (five) hours as needed (pain).     levothyroxine 100 MCG tablet  Commonly known as:  SYNTHROID, LEVOTHROID  Take 100 mcg by mouth daily. Do not take synthroid on Mon and Thursdays     lisinopril 10 MG tablet  Commonly known as:  PRINIVIL,ZESTRIL  Take 1 tablet (10 mg total) by mouth daily.     metoprolol succinate 25 MG 24 hr tablet  Commonly known as:  TOPROL-XL  Take 12.5 mg by mouth daily.     pregabalin 50 MG capsule  Commonly known as:  LYRICA  Take 50 mg by mouth 3 (three) times daily.     ranitidine 150 MG capsule  Commonly known as:  ZANTAC  Take 150 mg by mouth 2 (two) times daily.     RED YEAST RICE PO  Take 1 capsule by mouth daily.        Discharged Condition: Stable. Improved.    Consults: None.  Significant Diagnostic Studies: Dg Chest Portable 1 View  02/25/2014   CLINICAL DATA:  Nausea  EXAM: PORTABLE CHEST - 1 VIEW  COMPARISON:  09/13/2013  FINDINGS: Cardiac enlargement without heart failure. Lungs are clear without infiltrate or effusion. Negative for pneumonia.  IMPRESSION: No active disease.   Electronically  Signed   By: Franchot Gallo M.D.   On: 02/25/2014 16:56    Lab Results: Basic Metabolic Panel:  Recent Labs  02/25/14 1625 02/26/14 0242  NA 137 140  K 3.5* 3.6*  CL 97 103  CO2 29 25  GLUCOSE 110* 103*  BUN 14 11  CREATININE 0.77 0.69  CALCIUM 8.9 7.9*   Liver Function Tests:  Recent Labs  02/25/14 1625 02/26/14 0242  AST 15 9  ALT 16 11  ALKPHOS 127* 89  BILITOT 0.7 0.5  PROT 7.4 6.0  ALBUMIN 3.2* 2.4*     CBC:  Recent Labs  02/25/14 1625 02/26/14 0242  WBC 17.2* 14.1*  NEUTROABS 13.9*  --   HGB 11.7* 9.6*  HCT 36.7 29.6*  MCV 83.2 82.7  PLT 552* 427*    No results found for this or any previous visit (from the past 240 hour(s)).   Hospital Course: This pleasant 80 year old lady presented to the hospital yesterday with symptoms of diarrhea and vomiting as well as left-sided chest pain. Please see initial history as outlined below: 80 year old female who has a past medical history of Osteoarthritis; Macular degeneration; Hypertension; Thyroid disease; Thyroid cancer; GI bleed; Atrial fibrillation; C. difficile colitis; Diverticulitis; Shingles; and Lupus. Today presents to the hospital  with chief complaint of nausea vomiting and diarrhea which started yesterday, patient took Pepto-Bismol and diarrhea resolved. But today she also experienced left-sided chest pain with radiation to left arm to she got concerned and came to the ED. The chest pain is now resolved she does have chronic left sided pain from postherpetic neuralgia. She denies passing out, no slurred speech, no dysuria no fever. She admits to having some shortness of breath. Patient had 2 episodes of vomiting at home, denies vomiting at this time.  In the ED patient was found to have allergen white count of 17,000, UA was clear, chest x-ray did not show pneumonia. EKG showed left bundle branch block, as seen on previous EKG in May 2015.  Patient has a history of C. difficile quite is in the  past. Overnight she has drastically improved. Her diarrhea has resolved. Her left-sided chest pain has resolved and she thinks that on reflection this is probably related to her shingles. Serial cardiac enzymes are negative. She is keen to go home. Her hemoglobin is slightly reduced from yesterday but she has also had intravenous fluids for hydration I suspect this is dilutional. There is no evidence of GI bleed clinically. She will follow with her primary care physician as previously scheduled. Discharge Exam: Blood pressure 137/57, pulse 63, temperature 98.7 F (37.1 C), temperature source Oral, resp. rate 16, height 5\' 7"  (1.702 m), weight 62.914 kg (138 lb 11.2 oz), SpO2 97.00%. She looks systemically well. She is well-hydrated. She does not look pale. Heart sounds are present without gallop rhythm or murmurs. Lung fields are clear. There is slight left-sided anterior chest wall tenderness. She is alert and orientated without any focal neurological signs.  Disposition: Home.      Discharge Instructions   Diet - low sodium heart healthy    Complete by:  As directed      Increase activity slowly    Complete by:  As directed              Signed: Sumner C   02/26/2014, 8:17 AM

## 2014-02-26 NOTE — Discharge Planning (Signed)
Pt stated she was ready to be DC and she had no pain.  IV and tele removed and pt educated on future s/sx of chest pain, when  To call dr or return to hospital.  Pt will be wheeled to car once ride arrives

## 2014-02-27 NOTE — ED Provider Notes (Signed)
Medical screening examination/treatment/procedure(s) were conducted as a shared visit with non-physician practitioner(s) and myself.  I personally evaluated the patient during the encounter  Please see my separate respective documentation pertaining to this patient encounter   Johnna Acosta, MD 02/27/14 [phone]

## 2014-03-04 ENCOUNTER — Ambulatory Visit (HOSPITAL_COMMUNITY)
Admission: RE | Admit: 2014-03-04 | Discharge: 2014-03-04 | Disposition: A | Discharge status: Home / Self Care | Payer: Medicare Other | Source: Ambulatory Visit | Attending: Orthopaedic Surgery | Admitting: Orthopaedic Surgery

## 2014-03-04 DIAGNOSIS — M25669 Stiffness of unspecified knee, not elsewhere classified: Secondary | ICD-10-CM | POA: Insufficient documentation

## 2014-03-04 DIAGNOSIS — M25659 Stiffness of unspecified hip, not elsewhere classified: Secondary | ICD-10-CM | POA: Insufficient documentation

## 2014-03-04 DIAGNOSIS — M25561 Pain in right knee: Secondary | ICD-10-CM | POA: Diagnosis not present

## 2014-03-04 DIAGNOSIS — I10 Essential (primary) hypertension: Secondary | ICD-10-CM | POA: Diagnosis not present

## 2014-03-04 DIAGNOSIS — Z5189 Encounter for other specified aftercare: Secondary | ICD-10-CM | POA: Diagnosis present

## 2014-03-04 DIAGNOSIS — M25662 Stiffness of left knee, not elsewhere classified: Secondary | ICD-10-CM

## 2014-03-04 DIAGNOSIS — M25562 Pain in left knee: Secondary | ICD-10-CM | POA: Insufficient documentation

## 2014-03-04 DIAGNOSIS — M25661 Stiffness of right knee, not elsewhere classified: Secondary | ICD-10-CM

## 2014-03-04 DIAGNOSIS — M6281 Muscle weakness (generalized): Secondary | ICD-10-CM | POA: Diagnosis not present

## 2014-03-04 DIAGNOSIS — R262 Difficulty in walking, not elsewhere classified: Secondary | ICD-10-CM | POA: Insufficient documentation

## 2014-03-04 NOTE — Evaluation (Signed)
Physical Therapy Evaluation  Patient Details  Name: Danielle [name] MRN: [id number] Date of Birth: 05-25-1930  Today's Date: 03/04/2014 Time: 2229-7989 PT Time Calculation (min): 37 min     Charges: 1 Eval, TE [id number]         Visit#: 1 of 12  Re-eval: 04/03/14 Assessment Diagnosis: Bilateral knee pain, weakness and difficulty walking and decreased balance.  Next MD Visit: Luna Glasgow Prior Therapy: no  Authorization: Medicare/BCBS    Authorization Time Period:    Authorization Visit#: 1 of 12   Past Medical History:  Past Medical History  Diagnosis Date  . Osteoarthritis   . Macular degeneration   . Hypertension   . Thyroid disease   . Thyroid cancer   . GI bleed   . Atrial fibrillation   . C. difficile colitis   . Diverticulitis   . Shingles   . Lupus    Past Surgical History:  Past Surgical History  Procedure Laterality Date  . Thyroidectomy    . Colonoscopy  03/17/2011    Procedure: COLONOSCOPY;  Surgeon: Rogene Houston, MD;  Location: [facility];  Service: Endoscopy;  Laterality: N/A;  . Cyst removal neck      Subjective Symptoms/Limitations Pertinent History: long history of knee pain over the last 30 years. Rt knee pain > Lt knee pain. Patient also has history of falls onto Rt knee. Rt medial knee pain just medial t patella along medial joint line. patient notes :t Lknne just aches all over. 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 Patient states she has felt sick all year and notes that she is very weak. Patiet states she is feeling better now, but has an increased risk of falling.  fell 1 time in last 3 months.  How long can you walk comfortably?: <5 knees Patient Stated Goals: to be able to go shopping and alkin >60 minutes. to improve balance to decrease risk of falls Pain Assessment Currently in Pain?: Yes Pain Score: 4  Pain Location: Knee Pain  Orientation: Right Pain Type: Chronic pain  Sensation/Coordination/Flexibility/Functional Tests Flexibility Thomas: Positive Obers: Negative 90/90: Negative Functional Tests Functional Tests: Ely's test positive Functional Tests: 5x sit to stand 16.2 seconds Functional Tests: Gait: Rt LE antalgic gait, decreased stride length, and limited stability on Rt LE.    Assessment RLE AROM (degrees) Right Ankle Dorsiflexion: 4 RLE Strength Right Hip Flexion: 4/5 Right Hip Extension: 2/5 Right Hip External Rotation : 50 Right Hip Internal Rotation : 30 Right Knee Flexion: 3+/5 Right Knee Extension: 3+/5 (painful) Right Ankle Dorsiflexion: 4/5 LLE AROM (degrees) Left Hip External Rotation : 50 Left Hip Internal Rotation : 30 Left Ankle Dorsiflexion: 5 LLE Strength Left Hip Flexion: 4/5 Left Hip Extension: 2/5 Left Hip ABduction: 2/5 Left Knee Flexion: 4/5 Left Knee Extension: 4/5 Left Ankle Dorsiflexion: 5/5  Exercise/Treatments 5x sit to stand, 2x Calf stretch 4x 20 seconds quad stretch 4x 20seconds sidelaying hip abduction 10x  Physical Therapy Assessment and Plan PT Assessment and Plan Clinical Impression Statement: Patient displays bilateral knee pain, stiffness and weakness secodnary to a long history of knee pain and multiple bouts of illness over the last year that has resulted in overall LE weakness and abnormal gait patterns placign patient at increased risk of falls as indicated by her slower 5x sit to stand time. Patient also verbalized fear of falling. Patient will  beenfit from skille dphsyical therapy to incrrease LE strength and ROM to normalize gait, decrease knee pain and return to  walking community distances to be able to do her own grocery shopping.  Pt will benefit from skilled therapeutic intervention in order to improve on the following deficits: Abnormal gait;Decreased activity tolerance;Decreased balance;Difficulty walking;Pain;Decreased strength;Impaired  flexibility;Decreased range of motion;Decreased mobility Rehab Potential: Good PT Frequency: Min 3X/week PT Duration: 4 weeks PT Treatment/Interventions: Gait training;Stair training;Functional mobility training;Therapeutic activities;Therapeutic exercise;Balance training;Neuromuscular re-education;Modalities;Manual techniques;Patient/family education PT Plan: Initial plan of care per mD for 3x a week for 4 weeks, expeing patient to require at least 8 weeks of treatment to meet all goals and return to normal walkig without difficulty. initial focus on improvign flexiility of calfs, quadraceps and hip flexors. Progressign to more strengtheing exercises for glutes and hamstrigns as pain imprroves.     Goals Home Exercise Program Pt/caregiver will Perform Home Exercise Program: For increased ROM PT Goal: Perform Home Exercise Program - Progress: Goal set today PT Short Term Goals Time to Complete Short Term Goals: 4 weeks PT Short Term Goal 1: Patient will dmeosntrate a ngative thomas test to demosntrate improved stride length bilaterally. PT Short Term Goal 2: Patient will demonstrate a negative ely's test indicatign improved ability to squat to the floor. PT Short Term Goal 3: Patient will demonstrate improved calf flexibility of 10 degrees to ambulate with improved early heel rise durign gait.  PT Short Term Goal 4: Patient will dmeonstrate 5x sit to stand in <15 seconds indicatign patient not at increased falls risk  PT Short Term Goal 5: Patient will dmeosntrate hip abduction strength of 3/5 MMT   Problem List Patient Active Problem List   Diagnosis Date Noted  . Knee pain, bilateral 03/04/2014  . Stiffness of knee joint 03/04/2014  . Stiffness of both knees 03/04/2014  . Muscle weakness (generalized) 03/04/2014  . Stiffness of hip joint 03/04/2014  . Chest pain 02/25/2014  . Gastroenteritis 02/25/2014  . CHF (congestive heart failure) 09/13/2013  . UTI (urinary tract infection)  09/13/2013  . Splenic infarct 09/13/2013  . Abdominal pain 09/13/2013  . History of shingles 09/13/2013  . LBBB (left bundle branch block) 09/13/2013  . Diverticulosis 09/13/2013  . History of Clostridium difficile colitis 09/13/2013  . Dizziness 09/10/2013  . C. difficile colitis 07/01/2013  . Protein-calorie malnutrition, severe 07/01/2013  . Weakness generalized 06/30/2013  . Diarrhea 06/30/2013  . Clostridium difficile enteritis 06/26/2013  . Atrial fibrillation 06/19/2013  . Diverticulitis of colon 06/17/2013  . Hypokalemia 06/17/2013  . Elevated troponin 06/17/2013  . New left bundle branch block 06/17/2013  . Asymptomatic bacteriuria 06/17/2013  . Hypertension 03/28/2011  . Thyroid disease 03/28/2011  . Osteoarthritis 03/28/2011    PT - End of Session Activity Tolerance: Patient tolerated treatment well General Behavior During Therapy: WFL for tasks assessed/performed PT Plan of Care PT Home Exercise Plan: 5x sit to stand, side laying hip abduction, quad stretch, calf stretch.   GP Functional Assessment Tool Used: FOTO 57 % limited Functional Limitation: Mobility: Walking and moving around Mobility: Walking and Moving Around Current Status [phone]): At least 40 percent but less than 60 percent impaired, limited or restricted Mobility: Walking and Moving Around Goal Status [phone]): At least 20 percent but less than 40 percent impaired, limited or restricted  Leia Alf 03/04/2014, 2:37 PM  Physician Documentation Your signature is required to indicate approval of the treatment plan as stated above.  Please sign and either send electronically  or make a copy of this report for your files and return this physician signed original.   Please mark one 1.__approve of plan  2. ___approve of plan with the following conditions.   ______________________________                                                          _____________________ Physician Signature                                                                                                              Date

## 2014-03-06 ENCOUNTER — Ambulatory Visit (HOSPITAL_COMMUNITY)
Admission: RE | Admit: 2014-03-06 | Discharge: 2014-03-06 | Disposition: A | Discharge status: Home / Self Care | Payer: Medicare Other | Source: Ambulatory Visit | Attending: Family Medicine | Admitting: Family Medicine

## 2014-03-06 DIAGNOSIS — Z5189 Encounter for other specified aftercare: Secondary | ICD-10-CM | POA: Diagnosis not present

## 2014-03-06 NOTE — Progress Notes (Signed)
Physical Therapy Treatment Patient Details  Name: Danielle [name] MRN: [id number] Date of Birth: 12/18/1930  Today's Date: 03/06/2014 Time: 1245-8099 PT Time Calculation (min): 41 min   Charges: TE [id number] Visit#: 2 of 12  Re-eval: 04/03/14 Assessment Diagnosis: Bilateral knee pain, weakness and difficulty walking and decreased balance.  Next MD Visit: Luna Glasgow Prior Therapy: no  Authorization: Medicare/BCBS  Authorization Visit#: 2 of 12   Subjective: Symptoms/Limitations Symptoms: Patient notes pain 9/10 durign Nu-step, otherwise no pain noted except at end range of squatting.   Exercise/Treatments Stretches Sports administrator: 4 reps;20 seconds;Limitations Sports administrator Limitations: prone Hip Flexor Stretch: 4 reps;20 seconds;Limitations Hip Flexor Stretch Limitations: 14" box Gastroc Stretch: 4 reps;20 seconds;Limitations Gastroc Stretch Limitations: slant board and on floor Aerobic Stationary Bike: Nustep 10min lvl 1 Standing Rocker Board: 2 minutes;Limitations Rocker Board Limitations: anterior-posterior and Lt/Rt Other Standing Knee Exercises: 3D hip excursions Supine Heel Slides: Both;10 reps Bridges: Both;AROM;10 reps;2 sets Other Supine Knee Exercises: Trunk rotation 10x Prone  Hamstring Curl: 10 reps Straight Leg Raises: 10 reps;AROM;Both  Physical Therapy Assessment and Plan PT Assessment and Plan Clinical Impression Statement: Patient plan of care initiated with introduction of LE stretches. Patient noted pain during calf strretch, though she noted it felt like a stretch pain and stated it was not true pain. patient was given hand out with stretches to continued to be performed as part of HEP. Patient remains very anxious with regards to therapy due to hearing storieds of peaple having more pain after therapy. PT Plan:  initial focus on improving flexiility of calfs, quadraceps and hip flexors. Progressign to more strengtheing exercises for glutes and hamstrigns  as pain imprroves.     Goals PT Short Term Goals PT Short Term Goal 1: Patient will dmeosntrate a ngative thomas test to demosntrate improved stride length bilaterally. PT Short Term Goal 1 - Progress: Progressing toward goal PT Short Term Goal 2: Patient will demonstrate a negative ely's test indicatign improved ability to squat to the floor. PT Short Term Goal 2 - Progress: Progressing toward goal PT Short Term Goal 3: Patient will demonstrate improved calf flexibility of 10 degrees to ambulate with improved early heel rise durign gait.  PT Short Term Goal 3 - Progress: Progressing toward goal PT Short Term Goal 4: Patient will dmeonstrate 5x sit to stand in <15 seconds indicatign patient not at increased falls risk  PT Short Term Goal 4 - Progress: Progressing toward goal PT Short Term Goal 5: Patient will dmeosntrate hip abduction strength of 3/5 MMT  PT Short Term Goal 5 - Progress: Progressing toward goal  Problem List Patient Active Problem List   Diagnosis Date Noted  . Knee pain, bilateral 03/04/2014  . Stiffness of knee joint 03/04/2014  . Stiffness of both knees 03/04/2014  . Muscle weakness (generalized) 03/04/2014  . Stiffness of hip joint 03/04/2014  . Chest pain 02/25/2014  . Gastroenteritis 02/25/2014  . CHF (congestive heart failure) 09/13/2013  . UTI (urinary tract infection) 09/13/2013  . Splenic infarct 09/13/2013  . Abdominal pain 09/13/2013  . History of shingles 09/13/2013  . LBBB (left bundle branch block) 09/13/2013  . Diverticulosis 09/13/2013  . History of Clostridium difficile colitis 09/13/2013  . Dizziness 09/10/2013  . C. difficile colitis 07/01/2013  . Protein-calorie malnutrition, severe 07/01/2013  . Weakness generalized 06/30/2013  . Diarrhea 06/30/2013  . Clostridium difficile enteritis 06/26/2013  . Atrial fibrillation 06/19/2013  . Diverticulitis of colon 06/17/2013  . Hypokalemia 06/17/2013  . Elevated troponin  06/17/2013  . New left  bundle branch block 06/17/2013  . Asymptomatic bacteriuria 06/17/2013  . Hypertension 03/28/2011  . Thyroid disease 03/28/2011  . Osteoarthritis 03/28/2011    PT - End of Session Activity Tolerance: Patient tolerated treatment well General Behavior During Therapy: [employer] for tasks assessed/performed  GP    Mihir Flanigan R 03/06/2014, 3:15 PM

## 2014-03-10 ENCOUNTER — Ambulatory Visit (HOSPITAL_COMMUNITY): Payer: Medicare Other | Attending: Family Medicine | Admitting: Physical Therapy

## 2014-03-10 DIAGNOSIS — Z5189 Encounter for other specified aftercare: Secondary | ICD-10-CM | POA: Insufficient documentation

## 2014-03-10 DIAGNOSIS — R262 Difficulty in walking, not elsewhere classified: Secondary | ICD-10-CM | POA: Insufficient documentation

## 2014-03-10 DIAGNOSIS — M25561 Pain in right knee: Secondary | ICD-10-CM | POA: Insufficient documentation

## 2014-03-10 DIAGNOSIS — I10 Essential (primary) hypertension: Secondary | ICD-10-CM | POA: Insufficient documentation

## 2014-03-10 DIAGNOSIS — M6281 Muscle weakness (generalized): Secondary | ICD-10-CM | POA: Insufficient documentation

## 2014-03-12 ENCOUNTER — Ambulatory Visit (HOSPITAL_COMMUNITY): Payer: Medicare Other | Admitting: Physical Therapy

## 2014-03-12 ENCOUNTER — Telehealth (HOSPITAL_COMMUNITY): Payer: Self-pay | Admitting: Physical Therapy

## 2014-03-12 NOTE — Telephone Encounter (Signed)
Patient called wants to be D/C She has a ambilical hernia and can not do the exercies.

## 2014-03-16 ENCOUNTER — Ambulatory Visit (HOSPITAL_COMMUNITY): Payer: Medicare Other | Admitting: Physical Therapy

## 2014-03-19 ENCOUNTER — Ambulatory Visit (HOSPITAL_COMMUNITY): Payer: Medicare Other | Admitting: Physical Therapy

## 2014-03-23 ENCOUNTER — Ambulatory Visit (HOSPITAL_COMMUNITY): Payer: Medicare Other | Admitting: Physical Therapy

## 2014-03-26 ENCOUNTER — Ambulatory Visit (HOSPITAL_COMMUNITY): Payer: Medicare Other | Admitting: Physical Therapy

## 2014-03-28 ENCOUNTER — Encounter (HOSPITAL_COMMUNITY): Payer: Self-pay | Admitting: Emergency Medicine

## 2014-03-28 ENCOUNTER — Inpatient Hospital Stay (HOSPITAL_COMMUNITY)
Admission: EM | Admit: 2014-03-28 | Discharge: 2014-04-07 | DRG: 391 | Disposition: E | Payer: Medicare Other | Attending: Family Medicine | Admitting: Family Medicine

## 2014-03-28 ENCOUNTER — Emergency Department (HOSPITAL_COMMUNITY): Payer: Medicare Other

## 2014-03-28 DIAGNOSIS — R52 Pain, unspecified: Secondary | ICD-10-CM

## 2014-03-28 DIAGNOSIS — I429 Cardiomyopathy, unspecified: Secondary | ICD-10-CM | POA: Diagnosis present

## 2014-03-28 DIAGNOSIS — K5790 Diverticulosis of intestine, part unspecified, without perforation or abscess without bleeding: Secondary | ICD-10-CM | POA: Diagnosis present

## 2014-03-28 DIAGNOSIS — K529 Noninfective gastroenteritis and colitis, unspecified: Secondary | ICD-10-CM | POA: Diagnosis present

## 2014-03-28 DIAGNOSIS — Z515 Encounter for palliative care: Secondary | ICD-10-CM | POA: Diagnosis not present

## 2014-03-28 DIAGNOSIS — I482 Chronic atrial fibrillation: Secondary | ICD-10-CM | POA: Diagnosis present

## 2014-03-28 DIAGNOSIS — N390 Urinary tract infection, site not specified: Secondary | ICD-10-CM | POA: Diagnosis present

## 2014-03-28 DIAGNOSIS — I447 Left bundle-branch block, unspecified: Secondary | ICD-10-CM | POA: Diagnosis present

## 2014-03-28 DIAGNOSIS — I5022 Chronic systolic (congestive) heart failure: Secondary | ICD-10-CM | POA: Diagnosis present

## 2014-03-28 DIAGNOSIS — Z7901 Long term (current) use of anticoagulants: Secondary | ICD-10-CM

## 2014-03-28 DIAGNOSIS — Z85850 Personal history of malignant neoplasm of thyroid: Secondary | ICD-10-CM | POA: Diagnosis not present

## 2014-03-28 DIAGNOSIS — Z6821 Body mass index (BMI) 21.0-21.9, adult: Secondary | ICD-10-CM | POA: Diagnosis not present

## 2014-03-28 DIAGNOSIS — Z0189 Encounter for other specified special examinations: Secondary | ICD-10-CM

## 2014-03-28 DIAGNOSIS — E43 Unspecified severe protein-calorie malnutrition: Secondary | ICD-10-CM | POA: Diagnosis present

## 2014-03-28 DIAGNOSIS — I4891 Unspecified atrial fibrillation: Secondary | ICD-10-CM | POA: Diagnosis present

## 2014-03-28 DIAGNOSIS — K5720 Diverticulitis of large intestine with perforation and abscess without bleeding: Secondary | ICD-10-CM | POA: Diagnosis present

## 2014-03-28 DIAGNOSIS — R570 Cardiogenic shock: Secondary | ICD-10-CM | POA: Diagnosis not present

## 2014-03-28 DIAGNOSIS — N1330 Unspecified hydronephrosis: Secondary | ICD-10-CM | POA: Diagnosis present

## 2014-03-28 DIAGNOSIS — H3530 Unspecified macular degeneration: Secondary | ICD-10-CM | POA: Diagnosis present

## 2014-03-28 DIAGNOSIS — E876 Hypokalemia: Secondary | ICD-10-CM | POA: Diagnosis present

## 2014-03-28 DIAGNOSIS — M1990 Unspecified osteoarthritis, unspecified site: Secondary | ICD-10-CM | POA: Diagnosis present

## 2014-03-28 DIAGNOSIS — R531 Weakness: Secondary | ICD-10-CM

## 2014-03-28 DIAGNOSIS — Z79891 Long term (current) use of opiate analgesic: Secondary | ICD-10-CM

## 2014-03-28 DIAGNOSIS — R410 Disorientation, unspecified: Secondary | ICD-10-CM | POA: Diagnosis not present

## 2014-03-28 DIAGNOSIS — I480 Paroxysmal atrial fibrillation: Secondary | ICD-10-CM | POA: Diagnosis present

## 2014-03-28 DIAGNOSIS — I10 Essential (primary) hypertension: Secondary | ICD-10-CM | POA: Diagnosis present

## 2014-03-28 DIAGNOSIS — E039 Hypothyroidism, unspecified: Secondary | ICD-10-CM | POA: Diagnosis present

## 2014-03-28 DIAGNOSIS — F419 Anxiety disorder, unspecified: Secondary | ICD-10-CM | POA: Diagnosis present

## 2014-03-28 DIAGNOSIS — R0689 Other abnormalities of breathing: Secondary | ICD-10-CM

## 2014-03-28 DIAGNOSIS — I255 Ischemic cardiomyopathy: Secondary | ICD-10-CM | POA: Diagnosis present

## 2014-03-28 DIAGNOSIS — A419 Sepsis, unspecified organism: Secondary | ICD-10-CM | POA: Diagnosis not present

## 2014-03-28 DIAGNOSIS — Z66 Do not resuscitate: Secondary | ICD-10-CM | POA: Diagnosis present

## 2014-03-28 DIAGNOSIS — I509 Heart failure, unspecified: Secondary | ICD-10-CM

## 2014-03-28 DIAGNOSIS — D649 Anemia, unspecified: Secondary | ICD-10-CM | POA: Diagnosis present

## 2014-03-28 DIAGNOSIS — K429 Umbilical hernia without obstruction or gangrene: Secondary | ICD-10-CM | POA: Diagnosis present

## 2014-03-28 DIAGNOSIS — F29 Unspecified psychosis not due to a substance or known physiological condition: Secondary | ICD-10-CM | POA: Diagnosis not present

## 2014-03-28 DIAGNOSIS — A047 Enterocolitis due to Clostridium difficile: Secondary | ICD-10-CM | POA: Diagnosis present

## 2014-03-28 DIAGNOSIS — E860 Dehydration: Secondary | ICD-10-CM | POA: Diagnosis present

## 2014-03-28 DIAGNOSIS — J9690 Respiratory failure, unspecified, unspecified whether with hypoxia or hypercapnia: Secondary | ICD-10-CM

## 2014-03-28 DIAGNOSIS — I248 Other forms of acute ischemic heart disease: Secondary | ICD-10-CM

## 2014-03-28 DIAGNOSIS — R6521 Severe sepsis with septic shock: Secondary | ICD-10-CM | POA: Diagnosis not present

## 2014-03-28 DIAGNOSIS — E079 Disorder of thyroid, unspecified: Secondary | ICD-10-CM

## 2014-03-28 DIAGNOSIS — I469 Cardiac arrest, cause unspecified: Secondary | ICD-10-CM | POA: Diagnosis not present

## 2014-03-28 DIAGNOSIS — K5732 Diverticulitis of large intestine without perforation or abscess without bleeding: Secondary | ICD-10-CM | POA: Diagnosis present

## 2014-03-28 DIAGNOSIS — Z79899 Other long term (current) drug therapy: Secondary | ICD-10-CM | POA: Diagnosis not present

## 2014-03-28 DIAGNOSIS — K5792 Diverticulitis of intestine, part unspecified, without perforation or abscess without bleeding: Secondary | ICD-10-CM | POA: Diagnosis present

## 2014-03-28 DIAGNOSIS — I5021 Acute systolic (congestive) heart failure: Secondary | ICD-10-CM

## 2014-03-28 DIAGNOSIS — J9600 Acute respiratory failure, unspecified whether with hypoxia or hypercapnia: Secondary | ICD-10-CM | POA: Diagnosis present

## 2014-03-28 DIAGNOSIS — E872 Acidosis: Secondary | ICD-10-CM | POA: Diagnosis not present

## 2014-03-28 DIAGNOSIS — E119 Type 2 diabetes mellitus without complications: Secondary | ICD-10-CM | POA: Diagnosis present

## 2014-03-28 DIAGNOSIS — N170 Acute kidney failure with tubular necrosis: Secondary | ICD-10-CM | POA: Diagnosis present

## 2014-03-28 DIAGNOSIS — R1011 Right upper quadrant pain: Secondary | ICD-10-CM | POA: Diagnosis present

## 2014-03-28 DIAGNOSIS — Z01818 Encounter for other preprocedural examination: Secondary | ICD-10-CM

## 2014-03-28 HISTORY — DX: Left bundle-branch block, unspecified: I44.7

## 2014-03-28 HISTORY — DX: Essential (primary) hypertension: I10

## 2014-03-28 HISTORY — DX: Cardiomyopathy, unspecified: I42.9

## 2014-03-28 HISTORY — DX: Hypothyroidism, unspecified: E03.9

## 2014-03-28 LAB — URINALYSIS, ROUTINE W REFLEX MICROSCOPIC
Bilirubin Urine: NEGATIVE
Glucose, UA: NEGATIVE mg/dL
Ketones, ur: 15 mg/dL — AB
LEUKOCYTES UA: NEGATIVE
Nitrite: NEGATIVE
Protein, ur: NEGATIVE mg/dL
SPECIFIC GRAVITY, URINE: 1.02 (ref 1.005–1.030)
UROBILINOGEN UA: 0.2 mg/dL (ref 0.0–1.0)
pH: 5.5 (ref 5.0–8.0)

## 2014-03-28 LAB — CBC WITH DIFFERENTIAL/PLATELET
BASOS ABS: 0 10*3/uL (ref 0.0–0.1)
Basophils Relative: 0 % (ref 0–1)
EOS ABS: 0.1 10*3/uL (ref 0.0–0.7)
EOS PCT: 1 % (ref 0–5)
HCT: 34 % — ABNORMAL LOW (ref 36.0–46.0)
Hemoglobin: 10.8 g/dL — ABNORMAL LOW (ref 12.0–15.0)
LYMPHS ABS: 1.2 10*3/uL (ref 0.7–4.0)
Lymphocytes Relative: 11 % — ABNORMAL LOW (ref 12–46)
MCH: 26.4 pg (ref 26.0–34.0)
MCHC: 31.8 g/dL (ref 30.0–36.0)
MCV: 83.1 fL (ref 78.0–100.0)
Monocytes Absolute: 1.2 10*3/uL — ABNORMAL HIGH (ref 0.1–1.0)
Monocytes Relative: 11 % (ref 3–12)
Neutro Abs: 8.3 10*3/uL — ABNORMAL HIGH (ref 1.7–7.7)
Neutrophils Relative %: 77 % (ref 43–77)
Platelets: 642 10*3/uL — ABNORMAL HIGH (ref 150–400)
RBC: 4.09 MIL/uL (ref 3.87–5.11)
RDW: 15.9 % — ABNORMAL HIGH (ref 11.5–15.5)
WBC: 10.7 10*3/uL — ABNORMAL HIGH (ref 4.0–10.5)

## 2014-03-28 LAB — APTT: aPTT: 54 seconds — ABNORMAL HIGH (ref 24–37)

## 2014-03-28 LAB — COMPREHENSIVE METABOLIC PANEL
ALT: 8 U/L (ref 0–35)
AST: 12 U/L (ref 0–37)
Albumin: 3.2 g/dL — ABNORMAL LOW (ref 3.5–5.2)
Alkaline Phosphatase: 116 U/L (ref 39–117)
Anion gap: 14 (ref 5–15)
BUN: 15 mg/dL (ref 6–23)
CALCIUM: 9.1 mg/dL (ref 8.4–10.5)
CO2: 26 mEq/L (ref 19–32)
Chloride: 99 mEq/L (ref 96–112)
Creatinine, Ser: 0.91 mg/dL (ref 0.50–1.10)
GFR calc Af Amer: 67 mL/min — ABNORMAL LOW (ref >90)
GFR calc non Af Amer: 58 mL/min — ABNORMAL LOW (ref >90)
GLUCOSE: 107 mg/dL — AB (ref 70–99)
Potassium: 3.5 mEq/L — ABNORMAL LOW (ref 3.7–5.3)
SODIUM: 139 meq/L (ref 137–147)
TOTAL PROTEIN: 8.1 g/dL (ref 6.0–8.3)
Total Bilirubin: 0.6 mg/dL (ref 0.3–1.2)

## 2014-03-28 LAB — PROTIME-INR
INR: 1.48 (ref 0.00–1.49)
Prothrombin Time: 18 seconds — ABNORMAL HIGH (ref 11.6–15.2)

## 2014-03-28 LAB — HEPARIN LEVEL (UNFRACTIONATED)

## 2014-03-28 LAB — URINE MICROSCOPIC-ADD ON

## 2014-03-28 LAB — LIPASE, BLOOD: LIPASE: 26 U/L (ref 11–59)

## 2014-03-28 LAB — CLOSTRIDIUM DIFFICILE BY PCR: Toxigenic C. Difficile by PCR: POSITIVE — AB

## 2014-03-28 MED ORDER — LEVOTHYROXINE SODIUM 100 MCG IV SOLR: 50.0000 ug | INTRAVENOUS | Status: DC

## 2014-03-28 MED ORDER — HYDRALAZINE HCL 20 MG/ML IJ SOLN: 5.0000 mg | Freq: Four times a day (QID) | INTRAMUSCULAR | Status: DC | PRN

## 2014-03-28 MED ORDER — PIPERACILLIN-TAZOBACTAM 3.375 G IVPB
INTRAVENOUS | Status: AC
  Filled 2014-03-28: qty 100

## 2014-03-28 MED ORDER — METOPROLOL TARTRATE 1 MG/ML IV SOLN
5.0000 mg | Freq: Three times a day (TID) | INTRAVENOUS | Status: DC | PRN
  Administered 2014-04-02 – 2014-04-04 (×2): 5 mg via INTRAVENOUS
  Filled 2014-03-28 (×3): qty 5

## 2014-03-28 MED ORDER — ONDANSETRON HCL 4 MG/2ML IJ SOLN
4.0000 mg | Freq: Four times a day (QID) | INTRAMUSCULAR | Status: DC | PRN
  Administered 2014-03-29 – 2014-04-01 (×13): 4 mg via INTRAVENOUS
  Filled 2014-03-28 (×15): qty 2

## 2014-03-28 MED ORDER — CIPROFLOXACIN IN D5W 400 MG/200ML IV SOLN
400.0000 mg | Freq: Once | INTRAVENOUS | Status: AC
  Administered 2014-03-28: 400 mg via INTRAVENOUS
  Filled 2014-03-28: qty 200

## 2014-03-28 MED ORDER — METRONIDAZOLE IN NACL 5-0.79 MG/ML-% IV SOLN
500.0000 mg | Freq: Once | INTRAVENOUS | Status: AC
  Administered 2014-03-28: 500 mg via INTRAVENOUS
  Filled 2014-03-28: qty 100

## 2014-03-28 MED ORDER — PIPERACILLIN-TAZOBACTAM 3.375 G IVPB
3.3750 g | Freq: Three times a day (TID) | INTRAVENOUS | Status: DC
  Administered 2014-03-28 – 2014-03-29 (×2): 3.375 g via INTRAVENOUS
  Filled 2014-03-28 (×5): qty 50

## 2014-03-28 MED ORDER — METRONIDAZOLE IN NACL 5-0.79 MG/ML-% IV SOLN
500.0000 mg | Freq: Three times a day (TID) | INTRAVENOUS | Status: DC
  Administered 2014-03-29 – 2014-04-05 (×21): 500 mg via INTRAVENOUS
  Filled 2014-03-28 (×21): qty 100

## 2014-03-28 MED ORDER — ONDANSETRON HCL 4 MG PO TABS: 4.0000 mg | ORAL_TABLET | Freq: Four times a day (QID) | ORAL | Status: DC | PRN

## 2014-03-28 MED ORDER — METRONIDAZOLE IN NACL 5-0.79 MG/ML-% IV SOLN: 500.0000 mg | Freq: Once | INTRAVENOUS | Status: DC

## 2014-03-28 MED ORDER — ONDANSETRON HCL 4 MG/2ML IJ SOLN
4.0000 mg | Freq: Four times a day (QID) | INTRAMUSCULAR | Status: DC | PRN
  Administered 2014-03-28: 4 mg via INTRAVENOUS
  Filled 2014-03-28: qty 2

## 2014-03-28 MED ORDER — LEVOTHYROXINE SODIUM 100 MCG IV SOLR: 50.0000 ug | Freq: Every day | INTRAVENOUS | Status: DC

## 2014-03-28 MED ORDER — LEVOTHYROXINE SODIUM 100 MCG IV SOLR
50.0000 ug | INTRAVENOUS | Status: DC
  Filled 2014-03-28: qty 5

## 2014-03-28 MED ORDER — SODIUM CHLORIDE 0.9 % IV SOLN
INTRAVENOUS | Status: DC
  Administered 2014-03-28: 20:00:00 via INTRAVENOUS

## 2014-03-28 MED ORDER — PROMETHAZINE HCL 25 MG/ML IJ SOLN
12.5000 mg | Freq: Once | INTRAMUSCULAR | Status: AC
  Administered 2014-03-28: 12.5 mg via INTRAVENOUS
  Filled 2014-03-28: qty 1

## 2014-03-28 MED ORDER — MORPHINE SULFATE 2 MG/ML IJ SOLN
1.0000 mg | INTRAMUSCULAR | Status: DC | PRN
  Administered 2014-03-28 – 2014-04-02 (×30): 2 mg via INTRAVENOUS
  Filled 2014-03-28 (×30): qty 1

## 2014-03-28 MED ORDER — IOHEXOL 300 MG/ML  SOLN
50.0000 mL | Freq: Once | INTRAMUSCULAR | Status: AC | PRN
  Administered 2014-03-28: 50 mL via ORAL

## 2014-03-28 MED ORDER — HEPARIN (PORCINE) IN NACL 100-0.45 UNIT/ML-% IJ SOLN
1850.0000 [IU]/h | INTRAMUSCULAR | Status: DC
  Administered 2014-03-29: 850 [IU]/h via INTRAVENOUS
  Administered 2014-03-30: 950 [IU]/h via INTRAVENOUS
  Administered 2014-03-31 – 2014-04-02 (×3): 1100 [IU]/h via INTRAVENOUS
  Administered 2014-04-03: 1450 [IU]/h via INTRAVENOUS
  Administered 2014-04-03: 1100 [IU]/h via INTRAVENOUS
  Filled 2014-03-28 (×7): qty 250

## 2014-03-28 MED ORDER — LEVOTHYROXINE SODIUM 100 MCG IV SOLR
50.0000 ug | INTRAVENOUS | Status: DC
  Administered 2014-03-29 – 2014-04-04 (×5): 50 ug via INTRAVENOUS
  Filled 2014-03-28 (×6): qty 5

## 2014-03-28 MED ORDER — IOHEXOL 300 MG/ML  SOLN
100.0000 mL | Freq: Once | INTRAMUSCULAR | Status: AC | PRN
  Administered 2014-03-28: 100 mL via INTRAVENOUS

## 2014-03-28 NOTE — Progress Notes (Signed)
Pt had several loose stools. Placed on enteric precautions per protocol.

## 2014-03-28 NOTE — ED Provider Notes (Signed)
CSN: [id number]     Arrival date & time 03/13/2014  1525 History  This chart was scribed for Maudry Diego, MD by Dellis Filbert, ED Scribe. The patient was seen in [place] and the patient's care was started at 3:40 PM.    Chief Complaint  Patient presents with  . Abdominal Pain    Patient is a 80 y.o. female presenting with abdominal pain. The history is provided by the patient and the spouse. No language interpreter was used.  Abdominal Pain Pain location:  RUQ and suprapubic Pain quality: pressure   Pain severity:  Mild Chronicity:  Recurrent Worsened by:  Position changes Ineffective treatments:  None tried Associated symptoms: diarrhea, nausea and vomiting   Associated symptoms: no chest pain, no cough, no fatigue and no hematuria     HPI Comments: Danielle [name] is a 80 y.o. female who presents to the Emergency Department complaining of abdominal pain. She states when she sits down she feels pressure in her abdomen. She has nausea, loss of appetite, diaphoresis, diarrhea and vomiting as associated symptoms. She notes she was here 3 weeks for for the same complaint and she was given Zofran which relieved the nausea but she still has abdominal pain. Pt had a thyroidectomy and a cyst removal from her neck.   Past Medical History  Diagnosis Date  . Osteoarthritis   . Macular degeneration   . Hypertension   . Thyroid disease   . Thyroid cancer   . GI bleed   . Atrial fibrillation   . C. difficile colitis   . Diverticulitis   . Shingles   . Lupus    Past Surgical History  Procedure Laterality Date  . Thyroidectomy    . Colonoscopy  03/17/2011    Procedure: COLONOSCOPY;  Surgeon: Rogene Houston, MD;  Location: [facility];  Service: Endoscopy;  Laterality: N/A;  . Cyst removal neck     Family History  Problem Relation Age of Onset  . Tuberculosis Mother   . Thrombosis Father   . Deep vein thrombosis Sister   . Deep vein thrombosis Father    History   Substance Use Topics  . Smoking status: Never Smoker   . Smokeless tobacco: Never Used  . Alcohol Use: No   OB History    No data available     Review of Systems  Constitutional: Positive for diaphoresis and appetite change. Negative for fatigue.  HENT: Negative for congestion, ear discharge and sinus pressure.   Eyes: Negative for discharge.  Respiratory: Negative for cough.   Cardiovascular: Negative for chest pain.  Gastrointestinal: Positive for nausea, vomiting, abdominal pain and diarrhea.  Genitourinary: Negative for frequency and hematuria.  Musculoskeletal: Negative for back pain.  Skin: Negative for rash.  Neurological: Negative for seizures and headaches.  Psychiatric/Behavioral: Negative for hallucinations.      Allergies  Thyroid hormones  Home Medications   Prior to Admission medications   Medication Sig Start Date End Date Taking? Authorizing Provider  amLODipine (NORVASC) 5 MG tablet Take 5 mg by mouth daily.    Historical Provider, MD  apixaban (ELIQUIS) 5 MG TABS tablet Take 1 tablet (5 mg total) by mouth 2 (two) times daily. 09/16/13   Nimish Luther Parody, MD  capsaicin (ZOSTRIX) 0.025 % cream Apply topically 2 (two) times daily. 09/16/13   Nimish Luther Parody, MD  HYDROcodone-acetaminophen (NORCO/VICODIN) 5-325 MG per tablet Take 1 tablet by mouth every 5 (five) hours as needed (pain).  Historical Provider, MD  levothyroxine (SYNTHROID, LEVOTHROID) 100 MCG tablet Take 100 mcg by mouth daily. Do not take synthroid on Mon and Thursdays    Historical Provider, MD  lisinopril (PRINIVIL,ZESTRIL) 10 MG tablet Take 1 tablet (10 mg total) by mouth daily. 09/16/13   Nimish Luther Parody, MD  metoprolol succinate (TOPROL-XL) 25 MG 24 hr tablet Take 12.5 mg by mouth daily.    Historical Provider, MD  pregabalin (LYRICA) 50 MG capsule Take 50 mg by mouth 3 (three) times daily.    Historical Provider, MD  Probiotic Product (ALIGN PO) Take 1 tablet by mouth daily.    Historical  Provider, MD  ranitidine (ZANTAC) 150 MG capsule Take 150 mg by mouth 2 (two) times daily.    Historical Provider, MD  Red Yeast Rice Extract (RED YEAST RICE PO) Take 1 capsule by mouth daily.    Historical Provider, MD   BP 142/81 mmHg  Pulse 105  Temp(Src) 98.1 F (36.7 C) (Oral)  Resp 20  Wt 138 lb (62.596 kg)  SpO2 98% Physical Exam  Constitutional: She is oriented to person, place, and time. She appears well-developed.  HENT:  Head: Normocephalic.  Mouth/Throat: Mucous membranes are dry.  Eyes: Conjunctivae and EOM are normal. No scleral icterus.  Neck: Neck supple. No thyromegaly present.  Cardiovascular: Normal rate and regular rhythm.  Exam reveals no gallop and no friction rub.   No murmur heard. Pulmonary/Chest: No stridor. She has no wheezes. She has no rales. She exhibits no tenderness.  Abdominal: She exhibits no distension. There is no tenderness. There is no rebound.  Mild RUQ and suprapubic tenderness  Musculoskeletal: Normal range of motion. She exhibits no edema.  Lymphadenopathy:    She has no cervical adenopathy.  Neurological: She is oriented to person, place, and time. She exhibits normal muscle tone. Coordination normal.  Skin: No rash noted. No erythema.  Psychiatric: She has a normal mood and affect. Her behavior is normal.    ED Course  Procedures  DIAGNOSTIC STUDIES: Oxygen Saturation is 98% on room air, normal by my interpretation.    COORDINATION OF CARE: 3:46 PM Discussed treatment plan with pt at bedside and pt agreed to plan.  Labs Review Labs Reviewed - No data to display  Imaging Review No results found.   EKG Interpretation None     CRITICAL CARE Performed by: Eyvette Cordon L Total critical care time: 35 Critical care time was exclusive of separately billable procedures and treating other patients. Critical care was necessary to treat or prevent imminent or life-threatening deterioration. Critical care was time spent personally  by me on the following activities: development of treatment plan with patient and/or surrogate as well as nursing, discussions with consultants, evaluation of patient's response to treatment, examination of patient, obtaining history from patient or surrogate, ordering and performing treatments and interventions, ordering and review of laboratory studies, ordering and review of radiographic studies, pulse oximetry and re-evaluation of patient's condition.  MDM   Final diagnoses:  None   The chart was scribed for me under my direct supervision.  I personally performed the history, physical, and medical decision making and all procedures in the evaluation of this patient.Maudry Diego, MD 04/04/2014 Lurline Hare

## 2014-03-28 NOTE — ED Notes (Signed)
Pt reports emesis and lower abdominal pain. Pt reports seen and admitted for same x several weeks ago. nad noted.

## 2014-03-28 NOTE — Progress Notes (Signed)
Notified MD pt on unit.

## 2014-03-28 NOTE — Progress Notes (Signed)
Lab called and pt is positive for C. Diff. MD notified.

## 2014-03-28 NOTE — H&P (Addendum)
[facility] History and Physical  NOTNAMED CROUCHER QMG:[id number] DOB: May 18, 1930 DOA: 04/01/2014  Referring physician: Dr Roderic Palau PCP: [name], MD   Chief Complaint: abdominal pain and nausea since 3 weeks.   HPI: Danielle [name] is a 80 y.o. female with h/o diverticulosis, hypertension, hypothyroidism, chronic atrial fibrillation, h/o shingles, h/o splenic infarcts on anticoagulation, chronic systolic heart failure with EF of 40 to 45%, chronic LBBB, comes in for worsening left lower quadrant abdominal pain since 3 to 4 weeks associated with nausea, some loose stools. She denies fevers or chills. She reported that she was told she needed surgery for the diverticulosis but she did not want to go through it. She denies any fevers, reports some chills. On arrival to ED, she underwent CT ABDOMEN and pelvis, showing Moderate to extensive inflammation and scattered free air within the left and posterior pelvis likely related to rupture diverticulitis. No evidence of discrete focal abscess or bowel obstruction. New moderate left hydroureteronephrosis caused by left pelvic inflammation. Surgery was consulted by Dr Roderic Palau, and Dr Arnoldo Morale will see the patient. She is referred to medical service for admission for IV antibiotics and to see if she will improve without the need for surgery.    Review of Systems:  Constitutional:  No weight loss,   Positive for  Chills and fatigue, diaphoresis. HEENT:  No headaches, Difficulty swallowing,Tooth/dental problems,Sore throat,  No sneezing, itching, ear ache, nasal congestion, post nasal drip,  Cardio-vascular:  No chest pain, Orthopnea, PND, swelling in lower extremities, anasarca, dizziness, palpitations  GI:  Positive for LL quadrant abdominal pain, nausea vomiting, loose stools. Resp:  No shortness of breath with exertion or at rest. No excess mucus, no productive cough, No non-productive cough, No coughing up of blood.No change in  color of mucus.No wheezing.No chest wall deformity  Skin:  no rash or lesions.  GU:  no dysuria, change in color of urine, no urgency or frequency. No flank pain.  Musculoskeletal:  No joint pain or swelling. No decreased range of motion. No back pain.    Past Medical History  Diagnosis Date  . Osteoarthritis   . Macular degeneration   . Hypertension   . Thyroid disease   . Thyroid cancer   . GI bleed   . Atrial fibrillation   . C. difficile colitis   . Diverticulitis   . Shingles   . Lupus    Past Surgical History  Procedure Laterality Date  . Thyroidectomy    . Colonoscopy  03/17/2011    Procedure: COLONOSCOPY;  Surgeon: Rogene Houston, MD;  Location: [facility];  Service: Endoscopy;  Laterality: N/A;  . Cyst removal neck     Social History:  reports that she has never smoked. She has never used smokeless tobacco. She reports that she does not drink alcohol or use illicit drugs.  Allergies  Allergen Reactions  . Thyroid Hormones Hives and Rash    proparathyroid thyroid medication    Family History  Problem Relation Age of Onset  . Tuberculosis Mother   . Thrombosis Father   . Deep vein thrombosis Sister   . Deep vein thrombosis Father      Prior to Admission medications   Medication Sig Start Date End Date Taking? Authorizing Provider  amLODipine (NORVASC) 5 MG tablet Take 5 mg by mouth daily.   Yes Historical Provider, MD  apixaban (ELIQUIS) 5 MG TABS tablet Take 1 tablet (5 mg total) by mouth 2 (two) times daily. 09/16/13  Yes Nimish C Anastasio Champion, MD  capsaicin (ZOSTRIX) 0.025 % cream Apply topically 2 (two) times daily. Patient taking differently: Apply 1 application topically 2 (two) times daily as needed.  09/16/13  Yes Nimish Luther Parody, MD  HYDROcodone-acetaminophen (NORCO/VICODIN) 5-325 MG per tablet Take 1 tablet by mouth every 5 (five) hours as needed (pain).   Yes Historical Provider, MD  levothyroxine (SYNTHROID, LEVOTHROID) 100 MCG tablet Take 100 mcg  by mouth daily. Do not take synthroid on Mon and Thursdays   Yes Historical Provider, MD  lisinopril (PRINIVIL,ZESTRIL) 10 MG tablet Take 1 tablet (10 mg total) by mouth daily. 09/16/13  Yes Nimish Luther Parody, MD  metoprolol succinate (TOPROL-XL) 25 MG 24 hr tablet Take 12.5 mg by mouth daily.   Yes Historical Provider, MD  ondansetron (ZOFRAN) 4 MG tablet Take 4 mg by mouth every 8 (eight) hours as needed for nausea or vomiting.   Yes Historical Provider, MD  pregabalin (LYRICA) 50 MG capsule Take 50 mg by mouth 3 (three) times daily as needed (Pain).    Yes Historical Provider, MD  Probiotic Product (ALIGN PO) Take 1 tablet by mouth daily.   Yes Historical Provider, MD  ranitidine (ZANTAC) 150 MG capsule Take 150 mg by mouth 2 (two) times daily.   Yes Historical Provider, MD  Red Yeast Rice Extract (RED YEAST RICE PO) Take 1 capsule by mouth daily.   Yes Historical Provider, MD   Physical Exam: Filed Vitals:   04/04/2014 1820 03/24/2014 2022 03/09/2014 2033 03/09/2014 2056  BP: 133/76 124/67  155/87  Pulse: 100 98  102  Temp:    98.8 F (37.1 C)  TempSrc:    Oral  Resp: 20 24  16   Height:   5\' 7"  (1.702 m) 5\' 7"  (1.702 m)  Weight:    61.689 kg (136 lb)  SpO2: 96% 95%  99%    Wt Readings from Last 3 Encounters:  03/09/2014 61.689 kg (136 lb)  02/25/14 62.914 kg (138 lb 11.2 oz)  09/13/13 65.5 kg (144 lb 6.4 oz)    General:  Appears restless. Uncomfortable  Eyes: PERRL, normal lids, irises & conjunctiva Neck: no LAD, masses or thyromegaly Cardiovascular:irregularly irregular. Trace pedal edema.  Respiratory: CTA bilaterally, no w/r/r. Normal respiratory effort. Abdomen: soft, moderate tenderness int he LLQ, bowel sounds. No peritoneal signs.  Skin: no rash or induration seen on limited exam Musculoskeletal: grossly normal tone BUE/BLE Neurologic: grossly non-focal.          Labs on Admission:  Basic Metabolic Panel:  Recent Labs Lab 03/23/2014 1633  NA 139  K 3.5*  CL 99  CO2 26    GLUCOSE 107*  BUN 15  CREATININE 0.91  CALCIUM 9.1   Liver Function Tests:  Recent Labs Lab 04/01/2014 1633  AST 12  ALT 8  ALKPHOS 116  BILITOT 0.6  PROT 8.1  ALBUMIN 3.2*    Recent Labs Lab 03/14/2014 1633  LIPASE 26   No results for input(s): AMMONIA in the last 168 hours. CBC:  Recent Labs Lab 03/30/2014 1633  WBC 10.7*  NEUTROABS 8.3*  HGB 10.8*  HCT 34.0*  MCV 83.1  PLT 642*   Cardiac Enzymes: No results for input(s): CKTOTAL, CKMB, CKMBINDEX, TROPONINI in the last 168 hours.  BNP (last 3 results) No results for input(s): PROBNP in the last 8760 hours. CBG: No results for input(s): GLUCAP in the last 168 hours.  Radiological Exams on Admission: Ct Abdomen Pelvis W Contrast  03/11/2014  CLINICAL DATA:  80 year old female with left-sided abdominal and pelvic and nausea. History of lupus and thyroid cancer.  EXAM: CT ABDOMEN AND PELVIS WITH CONTRAST  TECHNIQUE: Multidetector CT imaging of the abdomen and pelvis was performed using the standard protocol following bolus administration of intravenous contrast.  CONTRAST:  14mL OMNIPAQUE IOHEXOL 300 MG/ML  SOLN  COMPARISON:  09/13/2013 and prior CTs dating back to 06/17/2013  FINDINGS: Cardiomegaly again identified.  Mildly enlarged lower thoracic periaortic lymph nodes are again noted with the index node measuring 11 mm in short axis (image 2).  There is moderate to extensive inflammation within the left and posterior pelvis with scattered foci of free air probably related to diverticulitis. Circumferential wall thickening of the sigmoid colon is noted which may be reactive.  Inflammation abuts anterior upper sacrum without evidence of osteomyelitis. This inflammation and gas also a lies along the medial aspect of the left psoas muscle.  No discrete focal abscess or small bowel obstruction identified.  There is new moderate left hydroureteronephrosis caused by the left pelvic inflammation.  The liver, pancreas, adrenal  glands, right kidney and gallbladder are unremarkable.  Splenic scarring is now noted.  The bladder and uterus are unremarkable.  Diverticulosis of the remaining colon identified.  A stable small paraumbilical hernia containing fat again noted.  A stable rim calcified lesion within the lower left pelvis is noted.  No acute or suspicious bony abnormalities are identified.  IMPRESSION: Moderate to extensive inflammation and scattered free air within the left and posterior pelvis likely related to rupture diverticulitis. No evidence of discrete focal abscess or bowel obstruction.  New moderate left hydroureteronephrosis caused by left pelvic inflammation.  Cardiomegaly, splenic scarring and small stable periumbilical hernia containing fat.   Electronically Signed   By: Hassan Rowan M.D.   On: 03/21/2014 18:07    EKG: atrial fibrillation, with LBBB  Assessment/Plan Active Problems:   Hypertension   Thyroid disease   Diverticulitis of colon   Atrial fibrillation   Weakness generalized   Protein-calorie malnutrition, severe   CHF (congestive heart failure)   Diverticulosis   Gastroenteritis   Diverticulitis   Diverticulitis of colon with perforation   1. Diverticulitis with perforation of the colon with free air in the pelvis; Admitted to telemetry and started her on IV zosyn, NPO with ice chips. IV anti emetics and IV pain control and monitor.  Surgery consulted and recommendations given.    2. Atrial fibrillation and h/o splenic infarcts: On anticoagulation and started her on IV heparin for bridge.  Rate controlled and resume BB as needed    3. Hypothyroidism: resume IV synthroid.   4. Mild renal insufficiency: Probably from dehydration. Continue to monitor.    5. Anemia: Normocytic and stable.   6. Left hydroureteronephrosis; with some renal insufficiency caused by left pelvic inflammation. Should improve as the pelvic inflammation improves. Continue to monitor renal function.    Code Status: full code.  DVT Prophylaxis: Family Communication: dicussed with family at bedside.  Disposition Plan: admit to telemetry  Time spent: 65 min  [facility] Pager [phone]

## 2014-03-28 NOTE — Progress Notes (Signed)
ANTIBIOTIC CONSULT NOTE  Pharmacy Consult for Zosyn Indication: Intra-Abdominal Infection  Allergies  Allergen Reactions  . Thyroid Hormones Hives and Rash    proparathyroid thyroid medication    Patient Measurements: Weight: 138 lb (62.596 kg)   Vital Signs: Temp: 98.1 F (36.7 C) (11/21 1530) Temp Source: Oral (11/21 1530) BP: 133/76 mmHg (11/21 1820) Pulse Rate: 100 (11/21 1820) Intake/Output from previous day:   Intake/Output from this shift:    Labs:  Recent Labs  03/08/2014 1633  WBC 10.7*  HGB 10.8*  PLT 642*  CREATININE 0.91   Estimated Creatinine Clearance: 47.1 mL/min (by C-G formula based on Cr of 0.91). No results for input(s): VANCOTROUGH, VANCOPEAK, VANCORANDOM, GENTTROUGH, GENTPEAK, GENTRANDOM, TOBRATROUGH, TOBRAPEAK, TOBRARND, AMIKACINPEAK, AMIKACINTROU, AMIKACIN in the last 72 hours.   Microbiology: No results found for this or any previous visit (from the past 720 hour(s)).  Anti-infectives    Start     Dose/Rate Route Frequency Ordered Stop   04/04/2014 1900  ciprofloxacin (CIPRO) IVPB 400 mg     400 mg200 mL/hr over 60 Minutes Intravenous  Once 03/24/2014 1849     03/20/2014 1900  metroNIDAZOLE (FLAGYL) IVPB 500 mg     500 mg100 mL/hr over 60 Minutes Intravenous  Once 03/19/2014 1849        Assessment: Okay for Protocol, complete H&P pending.  Goal of Therapy:  Eradicate infection.   Plan:  Zosyn 3.375gm IV every 8 hours. Follow-up micro data, labs, vitals.  Follow up culture results  [name] 03/27/2014,7:27 PM

## 2014-03-28 NOTE — Progress Notes (Signed)
ANTICOAGULATION CONSULT NOTE - Initial Consult  Pharmacy Consult for Heparin Indication: atrial fibrillation, bridge therapy.  Allergies  Allergen Reactions  . Thyroid Hormones Hives and Rash    proparathyroid thyroid medication    Patient Measurements: Height: 5\' 7"  (170.2 cm) Weight: 138 lb (62.596 kg) IBW/kg (Calculated) : 61.6 Heparin Dosing Weight: 63 kg  Vital Signs: Temp: 98.1 F (36.7 C) (11/21 1530) Temp Source: Oral (11/21 1530) BP: 124/67 mmHg (11/21 2022) Pulse Rate: 98 (11/21 2022)  Labs:  Recent Labs  03/21/2014 1633 03/16/2014 1900 03/10/2014 1947  HGB 10.8*  --   --   HCT 34.0*  --   --   PLT 642*  --   --   APTT  --  54*  --   LABPROT  --   --  18.0*  INR  --   --  1.48  CREATININE 0.91  --   --     Estimated Creatinine Clearance: 47.1 mL/min (by C-G formula based on Cr of 0.91).   Medical History: Past Medical History  Diagnosis Date  . Osteoarthritis   . Macular degeneration   . Hypertension   . Thyroid disease   . Thyroid cancer   . GI bleed   . Atrial fibrillation   . C. difficile colitis   . Diverticulitis   . Shingles   . Lupus     Medications:  Prescriptions prior to admission  Medication Sig Dispense Refill Last Dose  . amLODipine (NORVASC) 5 MG tablet Take 5 mg by mouth daily.   04/04/2014  . apixaban (ELIQUIS) 5 MG TABS tablet Take 1 tablet (5 mg total) by mouth 2 (two) times daily. 60 tablet 3 04/03/2014 at 11:30  . capsaicin (ZOSTRIX) 0.025 % cream Apply topically 2 (two) times daily. (Patient taking differently: Apply 1 application topically 2 (two) times daily as needed. ) 60 g 0 Past Month  . HYDROcodone-acetaminophen (NORCO/VICODIN) 5-325 MG per tablet Take 1 tablet by mouth every 5 (five) hours as needed (pain).   03/16/2014  . levothyroxine (SYNTHROID, LEVOTHROID) 100 MCG tablet Take 100 mcg by mouth daily. Do not take synthroid on Mon and Thursdays   03/17/2014  . lisinopril (PRINIVIL,ZESTRIL) 10 MG tablet Take 1 tablet  (10 mg total) by mouth daily. 30 tablet 3 03/08/2014  . metoprolol succinate (TOPROL-XL) 25 MG 24 hr tablet Take 12.5 mg by mouth daily.   03/31/2014 at 1130  . ondansetron (ZOFRAN) 4 MG tablet Take 4 mg by mouth every 8 (eight) hours as needed for nausea or vomiting.   03/09/2014  . pregabalin (LYRICA) 50 MG capsule Take 50 mg by mouth 3 (three) times daily as needed (Pain).    03/27/2014  . Probiotic Product (ALIGN PO) Take 1 tablet by mouth daily.   Past Week  . ranitidine (ZANTAC) 150 MG capsule Take 150 mg by mouth 2 (two) times daily.   03/29/2014  . Red Yeast Rice Extract (RED YEAST RICE PO) Take 1 capsule by mouth daily.   03/10/2014    Assessment: Okay for Protocol, admitted for Diverticulitis of colon with perforation.  Apixaban @ home for Afib, last dose at 11:30am today.   Goal of Therapy:  Heparin level 0.3-0.7 units/ml aPTT 66-102 seconds Monitor platelets by anticoagulation protocol: Yes   Plan:  Heparin to start 12 hours after last Apixaban dose. Start heparin infusion at 850 units/hr Continue to monitor H&H and platelets  Monitor PTTs until anti-Xa level @ baseline. PTT 8 hours after start. Daily  PTT, CBC and anti-Xa level.  Biagio Quint R 03/12/2014,8:49 PM

## 2014-03-29 LAB — COMPREHENSIVE METABOLIC PANEL
ALK PHOS: 93 U/L (ref 39–117)
ALT: 6 U/L (ref 0–35)
AST: 11 U/L (ref 0–37)
Albumin: 2.6 g/dL — ABNORMAL LOW (ref 3.5–5.2)
Anion gap: 16 — ABNORMAL HIGH (ref 5–15)
BUN: 12 mg/dL (ref 6–23)
CO2: 23 mEq/L (ref 19–32)
Calcium: 8.1 mg/dL — ABNORMAL LOW (ref 8.4–10.5)
Chloride: 101 mEq/L (ref 96–112)
Creatinine, Ser: 0.99 mg/dL (ref 0.50–1.10)
GFR, EST AFRICAN AMERICAN: 60 mL/min — AB (ref >90)
GFR, EST NON AFRICAN AMERICAN: 52 mL/min — AB (ref >90)
GLUCOSE: 134 mg/dL — AB (ref 70–99)
POTASSIUM: 3.2 meq/L — AB (ref 3.7–5.3)
Sodium: 140 mEq/L (ref 137–147)
Total Bilirubin: 0.9 mg/dL (ref 0.3–1.2)
Total Protein: 6.6 g/dL (ref 6.0–8.3)

## 2014-03-29 LAB — CBC
HCT: 29.3 % — ABNORMAL LOW (ref 36.0–46.0)
HEMOGLOBIN: 9.4 g/dL — AB (ref 12.0–15.0)
MCH: 26.3 pg (ref 26.0–34.0)
MCHC: 32.1 g/dL (ref 30.0–36.0)
MCV: 81.8 fL (ref 78.0–100.0)
PLATELETS: 495 10*3/uL — AB (ref 150–400)
RBC: 3.58 MIL/uL — ABNORMAL LOW (ref 3.87–5.11)
RDW: 15.9 % — ABNORMAL HIGH (ref 11.5–15.5)
WBC: 20.9 10*3/uL — ABNORMAL HIGH (ref 4.0–10.5)

## 2014-03-29 LAB — PROTIME-INR
INR: 1.86 — ABNORMAL HIGH (ref 0.00–1.49)
Prothrombin Time: 21.6 seconds — ABNORMAL HIGH (ref 11.6–15.2)

## 2014-03-29 LAB — APTT
APTT: 69 s — AB (ref 24–37)
aPTT: 58 seconds — ABNORMAL HIGH (ref 24–37)

## 2014-03-29 LAB — HEPARIN LEVEL (UNFRACTIONATED)

## 2014-03-29 MED ORDER — PROMETHAZINE HCL 25 MG/ML IJ SOLN
12.5000 mg | INTRAMUSCULAR | Status: DC | PRN
  Administered 2014-03-29 – 2014-04-01 (×14): 12.5 mg via INTRAVENOUS
  Filled 2014-03-29 (×14): qty 1

## 2014-03-29 MED ORDER — VANCOMYCIN 50 MG/ML ORAL SOLUTION
125.0000 mg | Freq: Four times a day (QID) | ORAL | Status: DC
  Administered 2014-03-29 – 2014-03-30 (×6): 125 mg via ORAL
  Filled 2014-03-29 (×13): qty 2.5

## 2014-03-29 MED ORDER — CIPROFLOXACIN IN D5W 400 MG/200ML IV SOLN
400.0000 mg | Freq: Two times a day (BID) | INTRAVENOUS | Status: DC
  Administered 2014-03-29 – 2014-04-04 (×12): 400 mg via INTRAVENOUS
  Filled 2014-03-29 (×12): qty 200

## 2014-03-29 MED ORDER — POTASSIUM CHLORIDE IN NACL 40-0.9 MEQ/L-% IV SOLN
INTRAVENOUS | Status: DC
Start: 2014-03-29 — End: 2014-04-03
  Administered 2014-03-29 – 2014-04-01 (×5): 75 mL/h via INTRAVENOUS
  Administered 2014-04-03: 50 mL/h via INTRAVENOUS

## 2014-03-29 NOTE — Consult Note (Signed)
Reason for Consult: Perforated diverticulitis Referring Physician: Dr. Lorriane Shire  Danielle [name] is an 80 y.o. female.  HPI: Patient is an 80 year old white female who presented emergency room with a several week history of worsening lower abdominal pain. CT scan of the abdomen was performed which revealed severe colitis secondary to diverticulitis with contained air around the colon. She did not have frank pneumoperitoneum. No abscess was seen. She has also tested positive for C. difficile colitis.  She has been on eliquis for her atrial fibrillation. She last had a colonoscopy in 2012. Currently, she states her abdominal pain is about the same. She does feel weak.  Past Medical History  Diagnosis Date  . Osteoarthritis   . Macular degeneration   . Hypertension   . Thyroid disease   . Thyroid cancer   . GI bleed   . Atrial fibrillation   . C. difficile colitis   . Diverticulitis   . Shingles   . Lupus     Past Surgical History  Procedure Laterality Date  . Thyroidectomy    . Colonoscopy  03/17/2011    Procedure: COLONOSCOPY;  Surgeon: Rogene Houston, MD;  Location: [facility];  Service: Endoscopy;  Laterality: N/A;  . Cyst removal neck      Family History  Problem Relation Age of Onset  . Tuberculosis Mother   . Thrombosis Father   . Deep vein thrombosis Sister   . Deep vein thrombosis Father     Social History:  reports that she has never smoked. She has never used smokeless tobacco. She reports that she does not drink alcohol or use illicit drugs.  Allergies:  Allergies  Allergen Reactions  . Thyroid Hormones Hives and Rash    proparathyroid thyroid medication    Medications: I have reviewed the patient's current medications.  Results for orders placed or performed during the hospital encounter of 03/31/2014 (from the past 48 hour(s))  Urinalysis, Routine w reflex microscopic     Status: Abnormal   Collection Time: 03/24/2014  4:12 PM  Result Value Ref Range    Color, Urine YELLOW YELLOW   APPearance CLEAR CLEAR   Specific Gravity, Urine 1.020 1.005 - 1.030   pH 5.5 5.0 - 8.0   Glucose, UA NEGATIVE NEGATIVE mg/dL   Hgb urine dipstick TRACE (A) NEGATIVE   Bilirubin Urine NEGATIVE NEGATIVE   Ketones, ur 15 (A) NEGATIVE mg/dL   Protein, ur NEGATIVE NEGATIVE mg/dL   Urobilinogen, UA 0.2 0.0 - 1.0 mg/dL   Nitrite NEGATIVE NEGATIVE   Leukocytes, UA NEGATIVE NEGATIVE  Urine microscopic-add on     Status: None   Collection Time: 03/27/2014  4:12 PM  Result Value Ref Range   WBC, UA 0-2 <3 WBC/hpf   RBC / HPF 0-2 <3 RBC/hpf  CBC with Differential     Status: Abnormal   Collection Time: 04/04/2014  4:33 PM  Result Value Ref Range   WBC 10.7 (H) 4.0 - 10.5 K/uL   RBC 4.09 3.87 - 5.11 MIL/uL   Hemoglobin 10.8 (L) 12.0 - 15.0 g/dL   HCT 34.0 (L) 36.0 - 46.0 %   MCV 83.1 78.0 - 100.0 fL   MCH 26.4 26.0 - 34.0 pg   MCHC 31.8 30.0 - 36.0 g/dL   RDW 15.9 (H) 11.5 - 15.5 %   Platelets 642 (H) 150 - 400 K/uL   Neutrophils Relative % 77 43 - 77 %   Neutro Abs 8.3 (H) 1.7 - 7.7 K/uL  Lymphocytes Relative 11 (L) 12 - 46 %   Lymphs Abs 1.2 0.7 - 4.0 K/uL   Monocytes Relative 11 3 - 12 %   Monocytes Absolute 1.2 (H) 0.1 - 1.0 K/uL   Eosinophils Relative 1 0 - 5 %   Eosinophils Absolute 0.1 0.0 - 0.7 K/uL   Basophils Relative 0 0 - 1 %   Basophils Absolute 0.0 0.0 - 0.1 K/uL  Comprehensive metabolic panel     Status: Abnormal   Collection Time: 03/20/2014  4:33 PM  Result Value Ref Range   Sodium 139 137 - 147 mEq/L   Potassium 3.5 (L) 3.7 - 5.3 mEq/L   Chloride 99 96 - 112 mEq/L   CO2 26 19 - 32 mEq/L   Glucose, Bld 107 (H) 70 - 99 mg/dL   BUN 15 6 - 23 mg/dL   Creatinine, Ser 0.91 0.50 - 1.10 mg/dL   Calcium 9.1 8.4 - 10.5 mg/dL   Total Protein 8.1 6.0 - 8.3 g/dL   Albumin 3.2 (L) 3.5 - 5.2 g/dL   AST 12 0 - 37 U/L   ALT 8 0 - 35 U/L   Alkaline Phosphatase 116 39 - 117 U/L   Total Bilirubin 0.6 0.3 - 1.2 mg/dL   GFR calc non Af Amer 58 (L) >90  mL/min   GFR calc Af Amer 67 (L) >90 mL/min    Comment: (NOTE) The eGFR has been calculated using the CKD EPI equation. This calculation has not been validated in all clinical situations. eGFR's persistently <90 mL/min signify possible Chronic Kidney Disease.    Anion gap 14 5 - 15  Lipase, blood     Status: None   Collection Time: 03/29/2014  4:33 PM  Result Value Ref Range   Lipase 26 11 - 59 U/L  APTT     Status: Abnormal   Collection Time: 03/08/2014  7:00 PM  Result Value Ref Range   aPTT 54 (H) 24 - 37 seconds    Comment:        IF BASELINE aPTT IS ELEVATED, SUGGEST PATIENT RISK ASSESSMENT BE USED TO DETERMINE APPROPRIATE ANTICOAGULANT THERAPY.   Protime-INR     Status: Abnormal   Collection Time: 03/29/2014  7:47 PM  Result Value Ref Range   Prothrombin Time 18.0 (H) 11.6 - 15.2 seconds   INR 1.48 0.00 - 1.49  Heparin level (unfractionated)     Status: Abnormal   Collection Time: 04/01/2014  8:30 PM  Result Value Ref Range   Heparin Unfractionated >2.20 (H) 0.30 - 0.70 IU/mL    Comment: RESULTS CONFIRMED BY MANUAL DILUTION        IF HEPARIN RESULTS ARE BELOW EXPECTED VALUES, AND PATIENT DOSAGE HAS BEEN CONFIRMED, SUGGEST FOLLOW UP TESTING OF ANTITHROMBIN III LEVELS. CORRECTED ON 11/21 AT 2141: PREVIOUSLY REPORTED AS >2.20        IF HEPARIN RESULTS ARE BELOW EXPECTED VALUES, AND PATIENT DOSAGE HAS BEEN CONFIRMED, SUGGEST FOLLOW UP TESTING OF ANTITHROMBIN III LEVELS., CORRECTED ON 11/21 AT 2137: PREVIOUSLY REPORTED AS  1.83        IF HEPARIN RESULTS ARE BELOW EXPECTED VALUES, AND PATIENT DOSAGE HAS BEEN CONFIRMED, SUGGEST FOLLOW UP TESTING OF ANTITHROMBIN III LEVELS. RESULTS CONFIRMED BY MANUAL DILUTION   Clostridium Difficile by PCR     Status: Abnormal   Collection Time: 03/12/2014 10:22 PM  Result Value Ref Range   C difficile by pcr POSITIVE (A) NEGATIVE    Comment: CRITICAL RESULT CALLED TO, READ BACK BY  AND VERIFIED WITH:  HAMILTON,S @ 1779 ON 03/15/2014 BY WOODIE,J    Comprehensive metabolic panel     Status: Abnormal   Collection Time: 03/29/14  6:44 AM  Result Value Ref Range   Sodium 140 137 - 147 mEq/L   Potassium 3.2 (L) 3.7 - 5.3 mEq/L   Chloride 101 96 - 112 mEq/L   CO2 23 19 - 32 mEq/L   Glucose, Bld 134 (H) 70 - 99 mg/dL   BUN 12 6 - 23 mg/dL   Creatinine, Ser 0.99 0.50 - 1.10 mg/dL   Calcium 8.1 (L) 8.4 - 10.5 mg/dL   Total Protein 6.6 6.0 - 8.3 g/dL   Albumin 2.6 (L) 3.5 - 5.2 g/dL   AST 11 0 - 37 U/L   ALT 6 0 - 35 U/L   Alkaline Phosphatase 93 39 - 117 U/L   Total Bilirubin 0.9 0.3 - 1.2 mg/dL   GFR calc non Af Amer 52 (L) >90 mL/min   GFR calc Af Amer 60 (L) >90 mL/min    Comment: (NOTE) The eGFR has been calculated using the CKD EPI equation. This calculation has not been validated in all clinical situations. eGFR's persistently <90 mL/min signify possible Chronic Kidney Disease.    Anion gap 16 (H) 5 - 15  CBC     Status: Abnormal   Collection Time: 03/29/14  6:44 AM  Result Value Ref Range   WBC 20.9 (H) 4.0 - 10.5 K/uL   RBC 3.58 (L) 3.87 - 5.11 MIL/uL   Hemoglobin 9.4 (L) 12.0 - 15.0 g/dL   HCT 29.3 (L) 36.0 - 46.0 %   MCV 81.8 78.0 - 100.0 fL   MCH 26.3 26.0 - 34.0 pg   MCHC 32.1 30.0 - 36.0 g/dL   RDW 15.9 (H) 11.5 - 15.5 %   Platelets 495 (H) 150 - 400 K/uL  APTT     Status: Abnormal   Collection Time: 03/29/14  6:44 AM  Result Value Ref Range   aPTT 58 (H) 24 - 37 seconds    Comment:        IF BASELINE aPTT IS ELEVATED, SUGGEST PATIENT RISK ASSESSMENT BE USED TO DETERMINE APPROPRIATE ANTICOAGULANT THERAPY.   Protime-INR     Status: Abnormal   Collection Time: 03/29/14  6:44 AM  Result Value Ref Range   Prothrombin Time 21.6 (H) 11.6 - 15.2 seconds   INR 1.86 (H) 0.00 - 1.49  Heparin level (unfractionated)     Status: Abnormal   Collection Time: 03/29/14  6:47 AM  Result Value Ref Range   Heparin Unfractionated >2.20 (H) 0.30 - 0.70 IU/mL    Comment: RESULTS CONFIRMED BY MANUAL DILUTION        IF  HEPARIN RESULTS ARE BELOW EXPECTED VALUES, AND PATIENT DOSAGE HAS BEEN CONFIRMED, SUGGEST FOLLOW UP TESTING OF ANTITHROMBIN III LEVELS.     Ct Abdomen Pelvis W Contrast  03/11/2014   CLINICAL DATA:  80 year old female with left-sided abdominal and pelvic and nausea. History of lupus and thyroid cancer.  EXAM: CT ABDOMEN AND PELVIS WITH CONTRAST  TECHNIQUE: Multidetector CT imaging of the abdomen and pelvis was performed using the standard protocol following bolus administration of intravenous contrast.  CONTRAST:  189m OMNIPAQUE IOHEXOL 300 MG/ML  SOLN  COMPARISON:  09/13/2013 and prior CTs dating back to 06/17/2013  FINDINGS: Cardiomegaly again identified.  Mildly enlarged lower thoracic periaortic lymph nodes are again noted with the index node measuring 11 mm in short axis (image 2).  There is moderate to extensive inflammation within the left and posterior pelvis with scattered foci of free air probably related to diverticulitis. Circumferential wall thickening of the sigmoid colon is noted which may be reactive.  Inflammation abuts anterior upper sacrum without evidence of osteomyelitis. This inflammation and gas also a lies along the medial aspect of the left psoas muscle.  No discrete focal abscess or small bowel obstruction identified.  There is new moderate left hydroureteronephrosis caused by the left pelvic inflammation.  The liver, pancreas, adrenal glands, right kidney and gallbladder are unremarkable.  Splenic scarring is now noted.  The bladder and uterus are unremarkable.  Diverticulosis of the remaining colon identified.  A stable small paraumbilical hernia containing fat again noted.  A stable rim calcified lesion within the lower left pelvis is noted.  No acute or suspicious bony abnormalities are identified.  IMPRESSION: Moderate to extensive inflammation and scattered free air within the left and posterior pelvis likely related to rupture diverticulitis. No evidence of discrete focal  abscess or bowel obstruction.  New moderate left hydroureteronephrosis caused by left pelvic inflammation.  Cardiomegaly, splenic scarring and small stable periumbilical hernia containing fat.   Electronically Signed   By: Hassan Rowan M.D.   On: 03/11/2014 18:07    ROS: See chart Blood pressure 118/60, pulse 100, temperature 99.4 F (37.4 C), temperature source Oral, resp. rate 14, height 5' 7" (1.702 m), weight 61.689 kg (136 lb), SpO2 99 %. Physical Exam: Pleasant white female in no acute distress. Abdomen is slightly distended but soft. She does have localized tenderness in the left lower quadrant. No rigidity is noted. An umbilical hernia is present.  Assessment/Plan: Impression: Sigmoid diverticulitis with localized perforation, C. difficile colitis, chronic atrial fibrillation with anticoagulation, grade 1 diastolic dysfunction Plan: No need for acute surgical intervention at this time. She has been heparinized. No intra-abdominal abscess is noted. Patient is a high risk surgical candidate. Ideally, would like to treat this medically. Would suggest repeat echo to assess cardiac function should she require surgery. Would continue bowel rest with ice chips only. Agree with antibiotic regimen.  Enna Warwick A 03/29/2014, 10:11 AM

## 2014-03-29 NOTE — Progress Notes (Signed)
Subjective: This patient is an 80 year old female who was admitted yesterday with abdominal pain and vomiting. She underwent a CT scan which revealed diverticulitis with perforation but no sign of abscess. There was inflammation in the left lower quadrant and left hydroureteronephrosis. She was started on antibiotic therapy. Surgery has been consulted. She continues to have pain this morning and continues to have vomiting. She has also had a stool return as positive for Clostridium difficile. She describes a prolonged hospitalization in the past with Clostridium difficile. She denies recent antibiotic use since February.  Objective: Vital signs in last 24 hours: Filed Vitals:   03/18/2014 2022 03/30/2014 2033 03/21/2014 2056 03/29/14 0549  BP: 124/67  155/87 118/60  Pulse: 98  102 100  Temp:   98.8 F (37.1 C) 99.4 F (37.4 C)  TempSrc:   Oral Oral  Resp: 24  16 14   Height:  5\' 7"  (1.702 m) 5\' 7"  (1.702 m)   Weight:   136 lb (61.689 kg)   SpO2: 95%  99% 99%   Weight change:   Intake/Output Summary (Last 24 hours) at 03/29/14 0939 Last data filed at 03/17/2014 1613  Gross per 24 hour  Intake      0 ml  Output     13 ml  Net    -13 ml    Physical Exam: Alert. No scleral icterus. Lungs clear. Heart irregularly irregular. Abdomen is soft but tender especially in the left lower quadrant. There is no palpable mass. Bowel sounds are present. She has had a diarrhea type stool this morning. Extremities reveal no edema.  Lab Results:    Results for orders placed or performed during the hospital encounter of 03/15/2014 (from the past 24 hour(s))  Urinalysis, Routine w reflex microscopic     Status: Abnormal   Collection Time: 03/19/2014  4:12 PM  Result Value Ref Range   Color, Urine YELLOW YELLOW   APPearance CLEAR CLEAR   Specific Gravity, Urine 1.020 1.005 - 1.030   pH 5.5 5.0 - 8.0   Glucose, UA NEGATIVE NEGATIVE mg/dL   Hgb urine dipstick TRACE (A) NEGATIVE   Bilirubin Urine NEGATIVE  NEGATIVE   Ketones, ur 15 (A) NEGATIVE mg/dL   Protein, ur NEGATIVE NEGATIVE mg/dL   Urobilinogen, UA 0.2 0.0 - 1.0 mg/dL   Nitrite NEGATIVE NEGATIVE   Leukocytes, UA NEGATIVE NEGATIVE  Urine microscopic-add on     Status: None   Collection Time: 03/09/2014  4:12 PM  Result Value Ref Range   WBC, UA 0-2 <3 WBC/hpf   RBC / HPF 0-2 <3 RBC/hpf  CBC with Differential     Status: Abnormal   Collection Time: 03/11/2014  4:33 PM  Result Value Ref Range   WBC 10.7 (H) 4.0 - 10.5 K/uL   RBC 4.09 3.87 - 5.11 MIL/uL   Hemoglobin 10.8 (L) 12.0 - 15.0 g/dL   HCT 34.0 (L) 36.0 - 46.0 %   MCV 83.1 78.0 - 100.0 fL   MCH 26.4 26.0 - 34.0 pg   MCHC 31.8 30.0 - 36.0 g/dL   RDW 15.9 (H) 11.5 - 15.5 %   Platelets 642 (H) 150 - 400 K/uL   Neutrophils Relative % 77 43 - 77 %   Neutro Abs 8.3 (H) 1.7 - 7.7 K/uL   Lymphocytes Relative 11 (L) 12 - 46 %   Lymphs Abs 1.2 0.7 - 4.0 K/uL   Monocytes Relative 11 3 - 12 %   Monocytes Absolute 1.2 (H) 0.1 - 1.0  K/uL   Eosinophils Relative 1 0 - 5 %   Eosinophils Absolute 0.1 0.0 - 0.7 K/uL   Basophils Relative 0 0 - 1 %   Basophils Absolute 0.0 0.0 - 0.1 K/uL  Comprehensive metabolic panel     Status: Abnormal   Collection Time: 03/30/2014  4:33 PM  Result Value Ref Range   Sodium 139 137 - 147 mEq/L   Potassium 3.5 (L) 3.7 - 5.3 mEq/L   Chloride 99 96 - 112 mEq/L   CO2 26 19 - 32 mEq/L   Glucose, Bld 107 (H) 70 - 99 mg/dL   BUN 15 6 - 23 mg/dL   Creatinine, Ser 0.91 0.50 - 1.10 mg/dL   Calcium 9.1 8.4 - 10.5 mg/dL   Total Protein 8.1 6.0 - 8.3 g/dL   Albumin 3.2 (L) 3.5 - 5.2 g/dL   AST 12 0 - 37 U/L   ALT 8 0 - 35 U/L   Alkaline Phosphatase 116 39 - 117 U/L   Total Bilirubin 0.6 0.3 - 1.2 mg/dL   GFR calc non Af Amer 58 (L) >90 mL/min   GFR calc Af Amer 67 (L) >90 mL/min   Anion gap 14 5 - 15  Lipase, blood     Status: None   Collection Time: 03/14/2014  4:33 PM  Result Value Ref Range   Lipase 26 11 - 59 U/L  APTT     Status: Abnormal    Collection Time: 03/08/2014  7:00 PM  Result Value Ref Range   aPTT 54 (H) 24 - 37 seconds  Protime-INR     Status: Abnormal   Collection Time: 03/27/2014  7:47 PM  Result Value Ref Range   Prothrombin Time 18.0 (H) 11.6 - 15.2 seconds   INR 1.48 0.00 - 1.49  Heparin level (unfractionated)     Status: Abnormal   Collection Time: 03/20/2014  8:30 PM  Result Value Ref Range   Heparin Unfractionated >2.20 (H) 0.30 - 0.70 IU/mL  Clostridium Difficile by PCR     Status: Abnormal   Collection Time: 03/14/2014 10:22 PM  Result Value Ref Range   C difficile by pcr POSITIVE (A) NEGATIVE  Comprehensive metabolic panel     Status: Abnormal   Collection Time: 03/29/14  6:44 AM  Result Value Ref Range   Sodium 140 137 - 147 mEq/L   Potassium 3.2 (L) 3.7 - 5.3 mEq/L   Chloride 101 96 - 112 mEq/L   CO2 23 19 - 32 mEq/L   Glucose, Bld 134 (H) 70 - 99 mg/dL   BUN 12 6 - 23 mg/dL   Creatinine, Ser 0.99 0.50 - 1.10 mg/dL   Calcium 8.1 (L) 8.4 - 10.5 mg/dL   Total Protein 6.6 6.0 - 8.3 g/dL   Albumin 2.6 (L) 3.5 - 5.2 g/dL   AST 11 0 - 37 U/L   ALT 6 0 - 35 U/L   Alkaline Phosphatase 93 39 - 117 U/L   Total Bilirubin 0.9 0.3 - 1.2 mg/dL   GFR calc non Af Amer 52 (L) >90 mL/min   GFR calc Af Amer 60 (L) >90 mL/min   Anion gap 16 (H) 5 - 15  CBC     Status: Abnormal   Collection Time: 03/29/14  6:44 AM  Result Value Ref Range   WBC 20.9 (H) 4.0 - 10.5 K/uL   RBC 3.58 (L) 3.87 - 5.11 MIL/uL   Hemoglobin 9.4 (L) 12.0 - 15.0 g/dL   HCT 29.3 (L)  36.0 - 46.0 %   MCV 81.8 78.0 - 100.0 fL   MCH 26.3 26.0 - 34.0 pg   MCHC 32.1 30.0 - 36.0 g/dL   RDW 15.9 (H) 11.5 - 15.5 %   Platelets 495 (H) 150 - 400 K/uL  APTT     Status: Abnormal   Collection Time: 03/29/14  6:44 AM  Result Value Ref Range   aPTT 58 (H) 24 - 37 seconds  Protime-INR     Status: Abnormal   Collection Time: 03/29/14  6:44 AM  Result Value Ref Range   Prothrombin Time 21.6 (H) 11.6 - 15.2 seconds   INR 1.86 (H) 0.00 - 1.49  Heparin  level (unfractionated)     Status: Abnormal   Collection Time: 03/29/14  6:47 AM  Result Value Ref Range   Heparin Unfractionated >2.20 (H) 0.30 - 0.70 IU/mL     ABGS No results for input(s): PHART, PO2ART, TCO2, HCO3 in the last 72 hours.  Invalid input(s): PCO2 CULTURES Recent Results (from the past 240 hour(s))  Clostridium Difficile by PCR     Status: Abnormal   Collection Time: 03/29/2014 10:22 PM  Result Value Ref Range Status   C difficile by pcr POSITIVE (A) NEGATIVE Final    Comment: CRITICAL RESULT CALLED TO, READ BACK BY AND VERIFIED WITH:  HAMILTON,S @ 8127 ON 03/22/2014 BY WOODIE,J    Studies/Results: Ct Abdomen Pelvis W Contrast  03/31/2014   CLINICAL DATA:  80 year old female with left-sided abdominal and pelvic and nausea. History of lupus and thyroid cancer.  EXAM: CT ABDOMEN AND PELVIS WITH CONTRAST  TECHNIQUE: Multidetector CT imaging of the abdomen and pelvis was performed using the standard protocol following bolus administration of intravenous contrast.  CONTRAST:  168mL OMNIPAQUE IOHEXOL 300 MG/ML  SOLN  COMPARISON:  09/13/2013 and prior CTs dating back to 06/17/2013  FINDINGS: Cardiomegaly again identified.  Mildly enlarged lower thoracic periaortic lymph nodes are again noted with the index node measuring 11 mm in short axis (image 2).  There is moderate to extensive inflammation within the left and posterior pelvis with scattered foci of free air probably related to diverticulitis. Circumferential wall thickening of the sigmoid colon is noted which may be reactive.  Inflammation abuts anterior upper sacrum without evidence of osteomyelitis. This inflammation and gas also a lies along the medial aspect of the left psoas muscle.  No discrete focal abscess or small bowel obstruction identified.  There is new moderate left hydroureteronephrosis caused by the left pelvic inflammation.  The liver, pancreas, adrenal glands, right kidney and gallbladder are unremarkable.   Splenic scarring is now noted.  The bladder and uterus are unremarkable.  Diverticulosis of the remaining colon identified.  A stable small paraumbilical hernia containing fat again noted.  A stable rim calcified lesion within the lower left pelvis is noted.  No acute or suspicious bony abnormalities are identified.  IMPRESSION: Moderate to extensive inflammation and scattered free air within the left and posterior pelvis likely related to rupture diverticulitis. No evidence of discrete focal abscess or bowel obstruction.  New moderate left hydroureteronephrosis caused by left pelvic inflammation.  Cardiomegaly, splenic scarring and small stable periumbilical hernia containing fat.   Electronically Signed   By: Hassan Rowan M.D.   On: 03/19/2014 18:07   Micro Results: Recent Results (from the past 240 hour(s))  Clostridium Difficile by PCR     Status: Abnormal   Collection Time: 04/01/2014 10:22 PM  Result Value Ref Range Status   C  difficile by pcr POSITIVE (A) NEGATIVE Final    Comment: CRITICAL RESULT CALLED TO, READ BACK BY AND VERIFIED WITH:  HAMILTON,S @ 2620 ON 03/21/2014 BY WOODIE,J    Studies/Results: Ct Abdomen Pelvis W Contrast  03/30/2014   CLINICAL DATA:  80 year old female with left-sided abdominal and pelvic and nausea. History of lupus and thyroid cancer.  EXAM: CT ABDOMEN AND PELVIS WITH CONTRAST  TECHNIQUE: Multidetector CT imaging of the abdomen and pelvis was performed using the standard protocol following bolus administration of intravenous contrast.  CONTRAST:  159mL OMNIPAQUE IOHEXOL 300 MG/ML  SOLN  COMPARISON:  09/13/2013 and prior CTs dating back to 06/17/2013  FINDINGS: Cardiomegaly again identified.  Mildly enlarged lower thoracic periaortic lymph nodes are again noted with the index node measuring 11 mm in short axis (image 2).  There is moderate to extensive inflammation within the left and posterior pelvis with scattered foci of free air probably related to diverticulitis.  Circumferential wall thickening of the sigmoid colon is noted which may be reactive.  Inflammation abuts anterior upper sacrum without evidence of osteomyelitis. This inflammation and gas also a lies along the medial aspect of the left psoas muscle.  No discrete focal abscess or small bowel obstruction identified.  There is new moderate left hydroureteronephrosis caused by the left pelvic inflammation.  The liver, pancreas, adrenal glands, right kidney and gallbladder are unremarkable.  Splenic scarring is now noted.  The bladder and uterus are unremarkable.  Diverticulosis of the remaining colon identified.  A stable small paraumbilical hernia containing fat again noted.  A stable rim calcified lesion within the lower left pelvis is noted.  No acute or suspicious bony abnormalities are identified.  IMPRESSION: Moderate to extensive inflammation and scattered free air within the left and posterior pelvis likely related to rupture diverticulitis. No evidence of discrete focal abscess or bowel obstruction.  New moderate left hydroureteronephrosis caused by left pelvic inflammation.  Cardiomegaly, splenic scarring and small stable periumbilical hernia containing fat.   Electronically Signed   By: Hassan Rowan M.D.   On: 03/17/2014 18:07   Medications:  I have reviewed the patient's current medications Scheduled Meds: . ciprofloxacin  400 mg Intravenous Q12H  . levothyroxine  50 mcg Intravenous Once per day on Sun Tue Wed Fri Sat  . metronidazole  500 mg Intravenous Q8H  . vancomycin  125 mg Oral 4 times per day   Continuous Infusions: . 0.9 % NaCl with KCl 40 mEq / L    . heparin 950 Units/hr (03/29/14 0920)   PRN Meds:.hydrALAZINE, metoprolol, morphine injection, ondansetron **OR** ondansetron (ZOFRAN) IV, promethazine   Assessment/Plan: #1. Diverticulitis with perforation. Surgery consult today. Continue Cipro and Flagyl. Her white count has increased to 20.9. #2. Clostridium difficile colitis. Start  oral vancomycin. #3. Hypokalemia. 3.2. Add intravenous potassium to her IV fluids. #4. Chronic atrial fibrillation. Heparin has been started and Eliquis has been held. Her INR is 1.8. #5. Left hydroureteronephrosis. She may require urology consultation. Active Problems:   Hypertension   Thyroid disease   Diverticulitis of colon   Atrial fibrillation   Weakness generalized   Protein-calorie malnutrition, severe   CHF (congestive heart failure)   Diverticulosis   Gastroenteritis   Diverticulitis   Diverticulitis of colon with perforation     LOS: 1 day   Katana Berthold 03/29/2014, 9:39 AM

## 2014-03-29 NOTE — Progress Notes (Signed)
ANTICOAGULATION CONSULT NOTE - Initial Consult  Pharmacy Consult for Heparin Indication: atrial fibrillation, bridge therapy.  Allergies  Allergen Reactions  . Thyroid Hormones Hives and Rash    proparathyroid thyroid medication    Patient Measurements: Height: 5\' 7"  (170.2 cm) Weight: 136 lb (61.689 kg) IBW/kg (Calculated) : 61.6 Heparin Dosing Weight: 63 kg  Vital Signs: Temp: 99.4 F (37.4 C) (11/22 0549) Temp Source: Oral (11/22 0549) BP: 118/60 mmHg (11/22 0549) Pulse Rate: 100 (11/22 0549)  Labs:  Recent Labs  04/06/2014 1633 03/31/2014 1900 03/23/2014 1947 03/18/2014 2030 03/29/14 0644 03/29/14 0647  HGB 10.8*  --   --   --  9.4*  --   HCT 34.0*  --   --   --  29.3*  --   PLT 642*  --   --   --  495*  --   APTT  --  54*  --   --  58*  --   LABPROT  --   --  18.0*  --  21.6*  --   INR  --   --  1.48  --  1.86*  --   HEPARINUNFRC  --   --   --  >2.20*  --  >2.20*  CREATININE 0.91  --   --   --  0.99  --     Estimated Creatinine Clearance: 43.3 mL/min (by C-G formula based on Cr of 0.99).   Medical History: Past Medical History  Diagnosis Date  . Osteoarthritis   . Macular degeneration   . Hypertension   . Thyroid disease   . Thyroid cancer   . GI bleed   . Atrial fibrillation   . C. difficile colitis   . Diverticulitis   . Shingles   . Lupus     Medications:  Prescriptions prior to admission  Medication Sig Dispense Refill Last Dose  . amLODipine (NORVASC) 5 MG tablet Take 5 mg by mouth daily.   03/26/2014  . apixaban (ELIQUIS) 5 MG TABS tablet Take 1 tablet (5 mg total) by mouth 2 (two) times daily. 60 tablet 3 03/16/2014 at 11:30  . capsaicin (ZOSTRIX) 0.025 % cream Apply topically 2 (two) times daily. (Patient taking differently: Apply 1 application topically 2 (two) times daily as needed. ) 60 g 0 Past Month  . HYDROcodone-acetaminophen (NORCO/VICODIN) 5-325 MG per tablet Take 1 tablet by mouth every 5 (five) hours as needed (pain).   03/29/2014   . levothyroxine (SYNTHROID, LEVOTHROID) 100 MCG tablet Take 100 mcg by mouth daily. Do not take synthroid on Mon and Thursdays   03/26/2014  . lisinopril (PRINIVIL,ZESTRIL) 10 MG tablet Take 1 tablet (10 mg total) by mouth daily. 30 tablet 3 03/19/2014  . metoprolol succinate (TOPROL-XL) 25 MG 24 hr tablet Take 12.5 mg by mouth daily.   03/15/2014 at 1130  . ondansetron (ZOFRAN) 4 MG tablet Take 4 mg by mouth every 8 (eight) hours as needed for nausea or vomiting.   04/02/2014  . pregabalin (LYRICA) 50 MG capsule Take 50 mg by mouth 3 (three) times daily as needed (Pain).    03/27/2014  . Probiotic Product (ALIGN PO) Take 1 tablet by mouth daily.   Past Week  . ranitidine (ZANTAC) 150 MG capsule Take 150 mg by mouth 2 (two) times daily.   03/30/2014  . Red Yeast Rice Extract (RED YEAST RICE PO) Take 1 capsule by mouth daily.   04/01/2014    Assessment: Okay for Protocol, admitted for Diverticulitis of colon  with perforation.  Apixaban @ home for Afib.  PTT below goal.  Anit-Xa level remains elevated as expected due to recent Apixaban doses.   Goal of Therapy:  Heparin level 0.3-0.7 units/ml aPTT 66-102 seconds Monitor platelets by anticoagulation protocol: Yes   Plan:  Increase Heparin to 950 units/hr Repeat PTT in 6-8 hours. Monitor PTTs until anti-Xa level @ baseline. Daily PTT, CBC and anti-Xa level.  Biagio Quint R 03/29/2014,8:28 AM

## 2014-03-30 ENCOUNTER — Ambulatory Visit (HOSPITAL_COMMUNITY): Payer: Medicare Other | Admitting: Physical Therapy

## 2014-03-30 DIAGNOSIS — A047 Enterocolitis due to Clostridium difficile: Secondary | ICD-10-CM

## 2014-03-30 DIAGNOSIS — K5720 Diverticulitis of large intestine with perforation and abscess without bleeding: Secondary | ICD-10-CM

## 2014-03-30 LAB — APTT
APTT: 50 s — AB (ref 24–37)
aPTT: 76 seconds — ABNORMAL HIGH (ref 24–37)

## 2014-03-30 LAB — BASIC METABOLIC PANEL
Anion gap: 16 — ABNORMAL HIGH (ref 5–15)
BUN: 10 mg/dL (ref 6–23)
CO2: 22 mEq/L (ref 19–32)
Calcium: 8.1 mg/dL — ABNORMAL LOW (ref 8.4–10.5)
Chloride: 103 mEq/L (ref 96–112)
Creatinine, Ser: 0.86 mg/dL (ref 0.50–1.10)
GFR calc Af Amer: 71 mL/min — ABNORMAL LOW (ref >90)
GFR, EST NON AFRICAN AMERICAN: 62 mL/min — AB (ref >90)
Glucose, Bld: 88 mg/dL (ref 70–99)
Potassium: 3 mEq/L — ABNORMAL LOW (ref 3.7–5.3)
Sodium: 141 mEq/L (ref 137–147)

## 2014-03-30 LAB — CBC
HCT: 29.8 % — ABNORMAL LOW (ref 36.0–46.0)
Hemoglobin: 9.5 g/dL — ABNORMAL LOW (ref 12.0–15.0)
MCH: 26.3 pg (ref 26.0–34.0)
MCHC: 31.9 g/dL (ref 30.0–36.0)
MCV: 82.5 fL (ref 78.0–100.0)
PLATELETS: 513 10*3/uL — AB (ref 150–400)
RBC: 3.61 MIL/uL — ABNORMAL LOW (ref 3.87–5.11)
RDW: 16.1 % — AB (ref 11.5–15.5)
WBC: 10.1 10*3/uL (ref 4.0–10.5)

## 2014-03-30 LAB — GI PATHOGEN PANEL BY PCR, STOOL
C difficile toxin A/B: POSITIVE
CAMPYLOBACTER BY PCR: NEGATIVE
Cryptosporidium by PCR: NEGATIVE
E COLI (STEC): NEGATIVE
E coli (ETEC) LT/ST: NEGATIVE
E coli 0157 by PCR: NEGATIVE
G lamblia by PCR: NEGATIVE
NOROVIRUS G1/G2: NEGATIVE
ROTAVIRUS A BY PCR: NEGATIVE
SALMONELLA BY PCR: NEGATIVE
SHIGELLA BY PCR: NEGATIVE

## 2014-03-30 LAB — HEPARIN LEVEL (UNFRACTIONATED): HEPARIN UNFRACTIONATED: 1.54 [IU]/mL — AB (ref 0.30–0.70)

## 2014-03-30 MED ORDER — VANCOMYCIN 50 MG/ML ORAL SOLUTION
250.0000 mg | Freq: Four times a day (QID) | ORAL | Status: DC
  Administered 2014-03-31 – 2014-04-05 (×17): 250 mg via ORAL
  Filled 2014-03-30 (×31): qty 5

## 2014-03-30 MED ORDER — SACCHAROMYCES BOULARDII 250 MG PO CAPS
250.0000 mg | ORAL_CAPSULE | Freq: Two times a day (BID) | ORAL | Status: DC
  Administered 2014-03-31 – 2014-04-04 (×8): 250 mg via ORAL
  Filled 2014-03-30 (×8): qty 1

## 2014-03-30 NOTE — Progress Notes (Signed)
ANTICOAGULATION CONSULT NOTE - follow up  Pharmacy Consult for Heparin Indication: atrial fibrillation, bridge therapy.  Allergies  Allergen Reactions  . Thyroid Hormones Hives and Rash    proparathyroid thyroid medication   Patient Measurements: Height: 5\' 7"  (170.2 cm) Weight: 136 lb (61.689 kg) IBW/kg (Calculated) : 61.6 Heparin Dosing Weight: 63 kg  Vital Signs: Temp: 98.9 F (37.2 C) (11/23 0431) Temp Source: Oral (11/23 0431) BP: 119/58 mmHg (11/23 0431) Pulse Rate: 105 (11/23 0431)  Labs:  Recent Labs  04/04/2014 1633  04/04/2014 1947 03/14/2014 2030 03/29/14 0644 03/29/14 0647 03/29/14 1554 03/30/14 0520  HGB 10.8*  --   --   --  9.4*  --   --  9.5*  HCT 34.0*  --   --   --  29.3*  --   --  29.8*  PLT 642*  --   --   --  495*  --   --  513*  APTT  --   < >  --   --  58*  --  69* 50*  LABPROT  --   --  18.0*  --  21.6*  --   --   --   INR  --   --  1.48  --  1.86*  --   --   --   HEPARINUNFRC  --   --   --  >2.20*  --  >2.20*  --  1.54*  CREATININE 0.91  --   --   --  0.99  --   --  0.86  < > = values in this interval not displayed.  Estimated Creatinine Clearance: 49.9 mL/min (by C-G formula based on Cr of 0.86).  Medical History: Past Medical History  Diagnosis Date  . Osteoarthritis   . Macular degeneration   . Hypertension   . Thyroid disease   . Thyroid cancer   . GI bleed   . Atrial fibrillation   . C. difficile colitis   . Diverticulitis   . Shingles   . Lupus    Medications:  Prescriptions prior to admission  Medication Sig Dispense Refill Last Dose  . amLODipine (NORVASC) 5 MG tablet Take 5 mg by mouth daily.   03/26/2014  . apixaban (ELIQUIS) 5 MG TABS tablet Take 1 tablet (5 mg total) by mouth 2 (two) times daily. 60 tablet 3 03/27/2014 at 11:30  . capsaicin (ZOSTRIX) 0.025 % cream Apply topically 2 (two) times daily. (Patient taking differently: Apply 1 application topically 2 (two) times daily as needed. ) 60 g 0 Past Month  .  HYDROcodone-acetaminophen (NORCO/VICODIN) 5-325 MG per tablet Take 1 tablet by mouth every 5 (five) hours as needed (pain).   03/24/2014  . levothyroxine (SYNTHROID, LEVOTHROID) 100 MCG tablet Take 100 mcg by mouth daily. Do not take synthroid on Mon and Thursdays   03/08/2014  . lisinopril (PRINIVIL,ZESTRIL) 10 MG tablet Take 1 tablet (10 mg total) by mouth daily. 30 tablet 3 04/02/2014  . metoprolol succinate (TOPROL-XL) 25 MG 24 hr tablet Take 12.5 mg by mouth daily.   03/29/2014 at 1130  . ondansetron (ZOFRAN) 4 MG tablet Take 4 mg by mouth every 8 (eight) hours as needed for nausea or vomiting.   03/19/2014  . pregabalin (LYRICA) 50 MG capsule Take 50 mg by mouth 3 (three) times daily as needed (Pain).    03/27/2014  . Probiotic Product (ALIGN PO) Take 1 tablet by mouth daily.   Past Week  . ranitidine (ZANTAC) 150 MG  capsule Take 150 mg by mouth 2 (two) times daily.   03/12/2014  . Red Yeast Rice Extract (RED YEAST RICE PO) Take 1 capsule by mouth daily.   03/30/2014   Assessment: Okay for Protocol, admitted for Diverticulitis of colon with perforation.  Apixaban @ home for Afib.  PTT below goal.  Anit-Xa level remains elevated as expected due to recent Apixaban doses.   Goal of Therapy:  Heparin level 0.3-0.7 units/ml aPTT 66-102 seconds Monitor platelets by anticoagulation protocol: Yes   Plan:  Increase Heparin to  1100 units/hr Repeat PTT in 6 hours. Monitor PTTs until anti-Xa level @ baseline. Daily PTT, CBC and anti-Xa level.  [place] Crane, Lalia Loudon A 03/30/2014,11:28 AM

## 2014-03-30 NOTE — Consult Note (Signed)
Referring Provider: Lucia Gaskins, MD Primary Care Physician:  [name], MD Primary Gastroenterologist:  Dr. Laural Golden  Reason for Consultation:    Sigmoid diverticulitis and C. difficile colitis.  HPI:   Patient old Caucasian female with multiple medical problems who was admitted to Dr. Lorriane Shire service 2 days ago via emergency room where she presented with more or less sudden onset of left lower quadrant abdominal pain. Pain started while she was eating at a [employer]. She experienced some nausea but no vomiting. Evaluation in emergency room revealed her to have sigmoid diverticulitis with extraluminal gas consistent with walled off perforation. Patient was begun on IV ciprofloxacin and metronidazole. Patient was hospitalized for therapy. She was seen in consultation by Dr. Arnoldo Morale who felt there was no indication for surgery. Patient has history of C. difficile colitis and therefore C. difficile by PCR was obtained was positive. She was therefore begun on vancomycin 125 mg by mouth every 6 hours. Patient remains with diarrhea. She is having less left-sided abdominal pain. She denies melena or rectal bleeding. She has poor appetite. She does not recall recent antibiotic use. Review of records revealed that she was on antibiotics back in May 2015 for urinary tract infection. Patient states she has lost 30 pounds this year. She feels depressed. She lives at home with her husband Laverna Peace. They have one daughter she lives out of state. They have a granddaughter who lives in [place].    Past Medical History  Diagnosis Date  . Osteoarthritis   . Macular degeneration   . Hypertension   . Thyroid disease   . Thyroid cancer   . GI bleed   . Atrial fibrillation   . C. difficile colitis   . Diverticulitis   . Shingles   . Lupus     Past Surgical History  Procedure Laterality Date  . Thyroidectomy    . Colonoscopy  03/17/2011    Procedure: COLONOSCOPY;  Surgeon: Rogene Houston, MD;  Location: [facility];  Service: Endoscopy;  Laterality: N/A;  . Cyst removal neck      Prior to Admission medications   Medication Sig Start Date End Date Taking? Authorizing Provider  amLODipine (NORVASC) 5 MG tablet Take 5 mg by mouth daily.   Yes Historical Provider, MD  apixaban (ELIQUIS) 5 MG TABS tablet Take 1 tablet (5 mg total) by mouth 2 (two) times daily. 09/16/13  Yes Nimish Luther Parody, MD  capsaicin (ZOSTRIX) 0.025 % cream Apply topically 2 (two) times daily. Patient taking differently: Apply 1 application topically 2 (two) times daily as needed.  09/16/13  Yes Nimish Luther Parody, MD  HYDROcodone-acetaminophen (NORCO/VICODIN) 5-325 MG per tablet Take 1 tablet by mouth every 5 (five) hours as needed (pain).   Yes Historical Provider, MD  levothyroxine (SYNTHROID, LEVOTHROID) 100 MCG tablet Take 100 mcg by mouth daily. Do not take synthroid on Mon and Thursdays   Yes Historical Provider, MD  lisinopril (PRINIVIL,ZESTRIL) 10 MG tablet Take 1 tablet (10 mg total) by mouth daily. 09/16/13  Yes Nimish Luther Parody, MD  metoprolol succinate (TOPROL-XL) 25 MG 24 hr tablet Take 12.5 mg by mouth daily.   Yes Historical Provider, MD  ondansetron (ZOFRAN) 4 MG tablet Take 4 mg by mouth every 8 (eight) hours as needed for nausea or vomiting.   Yes Historical Provider, MD  pregabalin (LYRICA) 50 MG capsule Take 50 mg by mouth 3 (three) times daily as needed (Pain).    Yes Historical Provider, MD  Probiotic Product (ALIGN PO) Take 1 tablet by mouth daily.   Yes Historical Provider, MD  ranitidine (ZANTAC) 150 MG capsule Take 150 mg by mouth 2 (two) times daily.   Yes Historical Provider, MD  Red Yeast Rice Extract (RED YEAST RICE PO) Take 1 capsule by mouth daily.   Yes Historical Provider, MD    Current Facility-Administered Medications  Medication Dose Route Frequency Provider Last Rate Last Dose  . 0.9 % NaCl with KCl 40 mEq / L  infusion   Intravenous Continuous Asencion Noble, MD 75 mL/hr  at 03/30/14 2116 75 mL/hr at 03/30/14 2116  . ciprofloxacin (CIPRO) IVPB 400 mg  400 mg Intravenous Q12H Asencion Noble, MD   400 mg at 03/30/14 2220  . heparin ADULT infusion 100 units/mL (25000 units/250 mL)  1,100 Units/hr Intravenous Continuous [name], MD 11 mL/hr at 03/30/14 1226 1,100 Units/hr at 03/30/14 1226  . hydrALAZINE (APRESOLINE) injection 5 mg  5 mg Intravenous Q6H PRN Hosie Poisson, MD      . levothyroxine (SYNTHROID, LEVOTHROID) injection 50 mcg  50 mcg Intravenous Once per day on Sun Tue Wed Fri Sat Hosie Poisson, MD   50 mcg at 03/29/14 1031  . metoprolol (LOPRESSOR) injection 5 mg  5 mg Intravenous Q8H PRN Hosie Poisson, MD      . metroNIDAZOLE (FLAGYL) IVPB 500 mg  500 mg Intravenous Q8H Dianne Dun, NP   500 mg at 03/30/14 2116  . morphine 2 MG/ML injection 1-2 mg  1-2 mg Intravenous Q3H PRN Hosie Poisson, MD   2 mg at 03/30/14 2211  . ondansetron (ZOFRAN) tablet 4 mg  4 mg Oral Q6H PRN Hosie Poisson, MD       Or  . ondansetron (ZOFRAN) injection 4 mg  4 mg Intravenous Q6H PRN Hosie Poisson, MD   4 mg at 03/30/14 1903  . promethazine (PHENERGAN) injection 12.5 mg  12.5 mg Intravenous Q4H PRN Asencion Noble, MD   12.5 mg at 03/30/14 2211  . saccharomyces boulardii (FLORASTOR) capsule 250 mg  250 mg Oral BID Rogene Houston, MD      . Derrill Memo ON 03/31/2014] vancomycin (VANCOCIN) 50 mg/mL oral solution 250 mg  250 mg Oral 4 times per day Rogene Houston, MD        Allergies as of 03/21/2014 - Review Complete 03/12/2014  Allergen Reaction Noted  . Thyroid hormones Hives and Rash 03/16/2011    Family History  Problem Relation Age of Onset  . Tuberculosis Mother   . Thrombosis Father   . Deep vein thrombosis Sister   . Deep vein thrombosis Father     History   Social History  . Marital Status: Married    Spouse Name: N/A    Number of Children: 2  . Years of Education: Post HS   Occupational History  . 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 Smoking status: Never Smoker   . Smokeless tobacco: Never Used  . Alcohol Use: No  . Drug Use: No  . Sexual Activity: No   Other Topics Concern  . Not on file   Social History Narrative   Lives with her husband, active.    Review of Systems: See HPI, otherwise normal ROS  Physical Exam: Temp:  [98.3 F (36.8 C)-98.9 F (37.2 C)] 98.3 F (36.8 C) (11/23 1422) Pulse Rate:  [105-111] 111 (11/23 1422) Resp:  [18-20]  18 (11/23 1422) BP: (108-119)/(58-64) 108/64 mmHg (11/23 1422) SpO2:  [97 %-100 %] 100 % (11/23 1422) Last BM Date: 03/30/14  Patient is alert and in no acute distress. Conjunctiva is pale. Sclerae nonicteric. Oropharyngeal mucosa is normal. No neck masses or thyromegaly noted. Cardiac exam with irregular rhythm normal S1 and S2. No murmur or gallop noted. Lungs are clear to auscultation. Abdomen is symmetrical with hyperactive bowel sounds. On palpation abdomen is soft with fullness in left lower quadrant with mild to moderate tenderness. Some guarding noted laterally. No organomegaly. No LE edema noted.  Lab Results:  Recent Labs  03/13/2014 1633 03/29/14 0644 03/30/14 0520  WBC 10.7* 20.9* 10.1  HGB 10.8* 9.4* 9.5*  HCT 34.0* 29.3* 29.8*  PLT 642* 495* 513*   BMET  Recent Labs  04/06/2014 1633 03/29/14 0644 03/30/14 0520  NA 139 140 141  K 3.5* 3.2* 3.0*  CL 99 101 103  CO2 26 23 22   GLUCOSE 107* 134* 88  BUN 15 12 10   CREATININE 0.91 0.99 0.86  CALCIUM 9.1 8.1* 8.1*   LFT  Recent Labs  03/29/14 0644  PROT 6.6  ALBUMIN 2.6*  AST 11  ALT 6  ALKPHOS 93  BILITOT 0.9   PT/INR  Recent Labs  03/16/2014 1947 03/29/14 0644  LABPROT 18.0* 21.6*  INR 1.48 1.86*    Assessment;  #1. Patient is 80 year old Caucasian female who was admitted over the weekend with lower abdominal pain and diarrhea and diagnosed with diverticulitis with contained or walled off perforation and begun on  ciprofloxacin and metronidazole intravenously and stool by PCR came back positive and she is now on oral vancomycin. Patient has history of C. difficile colitis which also followed out with diverticulitis back in February this year. She has not been on antibiotics recently. Since she has developed C. difficile colitis within a day off antibiotic therapy colonic flora has not fully recovered from her prior bout. She will need antibiotics for complicated diverticulitis for at least 2 weeks and will need by mouth vancomycin for at least 4 weeks. #2. Anemia felt to be secondary to acute illness. No evidence of overt GI bleed. She has history of colonic diverticular bleed over 3 years ago.   Recommendations;  Increase vancomycin dose to 250 mg by mouth every 6 hours. Florastor 1 capsule by mouth twice a day.    LOS: 2 days   Delita Chiquito U  03/30/2014, 10:25 PM

## 2014-03-30 NOTE — Care Management Note (Addendum)
    Page 1 of 1   04/03/2014     11:09:39 AM CARE MANAGEMENT NOTE 04/03/2014  Patient:  Danielle, [name]   Account Number:  [id number]  Date Initiated:  03/30/2014  Documentation initiated by:  Jolene Provost  Subjective/Objective Assessment:   pt is from home, lives with husband and has no HH services, DME's or med needs prior to admission. Pt has used [facility] in past and would like touse them agian in needed.     Action/Plan:   Pt plans to discharge home later in the week. Will continue to follow for CM needs.   Anticipated DC Date:  04/02/2014   Anticipated DC Plan:  [place]  CM consult      Choice offered to / List presented to:             Status of service:  In process, will continue to follow Medicare Important Message given?  YES (If response is "NO", the following Medicare IM given date fields will be blank) Date Medicare IM given:  04/01/2014 Medicare IM given by:  Jolene Provost Date Additional Medicare IM given:  04/03/2014 Additional Medicare IM given by:  JESSICA CHILDRESS  Discharge Disposition:  HOME/SELF CARE  Per UR Regulation:    If discussed at Long Length of Stay Meetings, dates discussed:    Comments:  04/03/2014 [place], RN, MSN, PCCN Pt transfered to ICU over holiday. Will continue to follow.  04/01/2014 Combine, RN, MSN, PCCN RN instructed to contact [facility] over holiday if pt discharged and needs HH. No HH ancipated at this time though. No CM needs identified at this time.  03/30/2014 Hughes, RN, MSN, [facility]

## 2014-03-30 NOTE — Plan of Care (Signed)
Problem: Phase I Progression Outcomes Goal: Pain controlled with appropriate interventions Outcome: Completed/Met Date Met:  03/30/14     

## 2014-03-30 NOTE — Progress Notes (Signed)
Subjective: Patient states she does feel better, though she complains mostly about the diarrhea.  Objective: Vital signs in last 24 hours: Temp:  [98.8 F (37.1 C)-98.9 F (37.2 C)] 98.9 F (37.2 C) (11/23 0431) Pulse Rate:  [94-105] 105 (11/23 0431) Resp:  [20] 20 (11/23 0431) BP: (119-124)/(58-79) 119/58 mmHg (11/23 0431) SpO2:  [94 %-97 %] 97 % (11/23 0431) Last BM Date: 03/29/14  Intake/Output from previous day: 11/22 0701 - 11/23 0700 In: 2391 [I.V.:1591; IV Piggyback:800] Out: -  Intake/Output this shift: Total I/O In: -  Out: 200 [Urine:200]  General appearance: alert and cooperative GI: Soft, slightly distended, no rigidity noted. Localized tenderness to the left lower quadrant is noted.  Lab Results:   Recent Labs  03/29/14 0644 03/30/14 0520  WBC 20.9* 10.1  HGB 9.4* 9.5*  HCT 29.3* 29.8*  PLT 495* 513*   BMET  Recent Labs  03/29/14 0644 03/30/14 0520  NA 140 141  K 3.2* 3.0*  CL 101 103  CO2 23 22  GLUCOSE 134* 88  BUN 12 10  CREATININE 0.99 0.86  CALCIUM 8.1* 8.1*   PT/INR  Recent Labs  03/22/2014 1947 03/29/14 0644  LABPROT 18.0* 21.6*  INR 1.48 1.86*    Studies/Results: Ct Abdomen Pelvis W Contrast  04/02/2014   CLINICAL DATA:  80 year old female with left-sided abdominal and pelvic and nausea. History of lupus and thyroid cancer.  EXAM: CT ABDOMEN AND PELVIS WITH CONTRAST  TECHNIQUE: Multidetector CT imaging of the abdomen and pelvis was performed using the standard protocol following bolus administration of intravenous contrast.  CONTRAST:  127mL OMNIPAQUE IOHEXOL 300 MG/ML  SOLN  COMPARISON:  09/13/2013 and prior CTs dating back to 06/17/2013  FINDINGS: Cardiomegaly again identified.  Mildly enlarged lower thoracic periaortic lymph nodes are again noted with the index node measuring 11 mm in short axis (image 2).  There is moderate to extensive inflammation within the left and posterior pelvis with scattered foci of free air  probably related to diverticulitis. Circumferential wall thickening of the sigmoid colon is noted which may be reactive.  Inflammation abuts anterior upper sacrum without evidence of osteomyelitis. This inflammation and gas also a lies along the medial aspect of the left psoas muscle.  No discrete focal abscess or small bowel obstruction identified.  There is new moderate left hydroureteronephrosis caused by the left pelvic inflammation.  The liver, pancreas, adrenal glands, right kidney and gallbladder are unremarkable.  Splenic scarring is now noted.  The bladder and uterus are unremarkable.  Diverticulosis of the remaining colon identified.  A stable small paraumbilical hernia containing fat again noted.  A stable rim calcified lesion within the lower left pelvis is noted.  No acute or suspicious bony abnormalities are identified.  IMPRESSION: Moderate to extensive inflammation and scattered free air within the left and posterior pelvis likely related to rupture diverticulitis. No evidence of discrete focal abscess or bowel obstruction.  New moderate left hydroureteronephrosis caused by left pelvic inflammation.  Cardiomegaly, splenic scarring and small stable periumbilical hernia containing fat.   Electronically Signed   By: Hassan Rowan M.D.   On: 03/08/2014 18:07    Anti-infectives: Anti-infectives    Start     Dose/Rate Route Frequency Ordered Stop   03/29/14 1200  vancomycin (VANCOCIN) 50 mg/mL oral solution 125 mg     125 mg Oral 4 times per day 03/29/14 0935     03/29/14 1000  ciprofloxacin (CIPRO) IVPB 400 mg     400 mg200 mL/hr  over 60 Minutes Intravenous Every 12 hours 03/29/14 0937     03/29/14 0500  metroNIDAZOLE (FLAGYL) IVPB 500 mg     500 mg100 mL/hr over 60 Minutes Intravenous Every 8 hours 04/04/2014 2337     03/16/2014 2100  metroNIDAZOLE (FLAGYL) IVPB 500 mg     500 mg100 mL/hr over 60 Minutes Intravenous  Once 03/15/2014 2030 03/24/2014 2216   03/15/2014 2000  piperacillin-tazobactam (ZOSYN)  IVPB 3.375 g  Status:  Discontinued     3.375 g12.5 mL/hr over 240 Minutes Intravenous Every 8 hours 03/13/2014 1933 03/29/14 0937   03/20/2014 1900  ciprofloxacin (CIPRO) IVPB 400 mg     400 mg200 mL/hr over 60 Minutes Intravenous  Once 04/04/2014 1849 03/27/2014 2020   03/29/2014 1900  metroNIDAZOLE (FLAGYL) IVPB 500 mg  Status:  Discontinued     500 mg100 mL/hr over 60 Minutes Intravenous  Once 03/23/2014 1849 04/01/2014 2030      Assessment/Plan: Impression: Sigmoid diverticulitis with contained perforation, C. difficile colitis. Leukocytosis has returned to normal. Clinically, she is slightly improved. No need for acute surgical intervention.  Plan: Continue current treatment regimen.  LOS: 2 days    Danielle [name] A 03/30/2014

## 2014-03-30 NOTE — Progress Notes (Signed)
Patient admitted with sigmoid diverticulitis and C. difficile colitis which is recurrent seen by surgery on multiple antibiotics CT scan consistent with microscopic perforation some concomitant moderate left hydronephrosis or inflammation with associated hypokalemia H and started on clear liquids today Danielle [name] LOV:[id number] DOB: 1930-12-05 DOA: 03/18/2014 PCP: [name], MD             Physical Exam: Blood pressure 119/58, pulse 105, temperature 98.9 F (37.2 C), temperature source Oral, resp. rate 20, height 5\' 7"  (1.702 m), weight 136 lb (61.689 kg), SpO2 97 %. neck no JVD no carotid bruits no thyromegaly lungs clear to A&P no rales wheeze or rhonchi  heart irregular regular no S3 not auscultated no heaves chills or rubs. Abdomen has some mild left lower quadrant discomfort on palpation no guarding no rebound bowel sounds normoactive and no borborygmi   Investigations:  Recent Results (from the past 240 hour(s))  Clostridium Difficile by PCR     Status: Abnormal   Collection Time: 03/16/2014 10:22 PM  Result Value Ref Range Status   C difficile by pcr POSITIVE (A) NEGATIVE Final    Comment: CRITICAL RESULT CALLED TO, READ BACK BY AND VERIFIED WITH:  HAMILTON,S @ 8841 ON 03/30/2014 BY WOODIE,J      Basic Metabolic Panel:  Recent Labs  03/29/14 0644 03/30/14 0520  NA 140 141  K 3.2* 3.0*  CL 101 103  CO2 23 22  GLUCOSE 134* 88  BUN 12 10  CREATININE 0.99 0.86  CALCIUM 8.1* 8.1*   Liver Function Tests:  Recent Labs  04/06/2014 1633 03/29/14 0644  AST 12 11  ALT 8 6  ALKPHOS 116 93  BILITOT 0.6 0.9  PROT 8.1 6.6  ALBUMIN 3.2* 2.6*     CBC:  Recent Labs  04/06/2014 1633 03/29/14 0644 03/30/14 0520  WBC 10.7* 20.9* 10.1  NEUTROABS 8.3*  --   --   HGB 10.8* 9.4* 9.5*  HCT 34.0* 29.3* 29.8*  MCV 83.1 81.8 82.5  PLT 642* 495* 513*    Ct Abdomen Pelvis W Contrast  03/13/2014   CLINICAL DATA:  80 year old female with left-sided abdominal  and pelvic and nausea. History of lupus and thyroid cancer.  EXAM: CT ABDOMEN AND PELVIS WITH CONTRAST  TECHNIQUE: Multidetector CT imaging of the abdomen and pelvis was performed using the standard protocol following bolus administration of intravenous contrast.  CONTRAST:  172mL OMNIPAQUE IOHEXOL 300 MG/ML  SOLN  COMPARISON:  09/13/2013 and prior CTs dating back to 06/17/2013  FINDINGS: Cardiomegaly again identified.  Mildly enlarged lower thoracic periaortic lymph nodes are again noted with the index node measuring 11 mm in short axis (image 2).  There is moderate to extensive inflammation within the left and posterior pelvis with scattered foci of free air probably related to diverticulitis. Circumferential wall thickening of the sigmoid colon is noted which may be reactive.  Inflammation abuts anterior upper sacrum without evidence of osteomyelitis. This inflammation and gas also a lies along the medial aspect of the left psoas muscle.  No discrete focal abscess or small bowel obstruction identified.  There is new moderate left hydroureteronephrosis caused by the left pelvic inflammation.  The liver, pancreas, adrenal glands, right kidney and gallbladder are unremarkable.  Splenic scarring is now noted.  The bladder and uterus are unremarkable.  Diverticulosis of the remaining colon identified.  A stable small paraumbilical hernia containing fat again noted.  A stable rim calcified lesion within the lower left pelvis is noted.  No acute  or suspicious bony abnormalities are identified.  IMPRESSION: Moderate to extensive inflammation and scattered free air within the left and posterior pelvis likely related to rupture diverticulitis. No evidence of discrete focal abscess or bowel obstruction.  New moderate left hydroureteronephrosis caused by left pelvic inflammation.  Cardiomegaly, splenic scarring and small stable periumbilical hernia containing fat.   Electronically Signed   By: Hassan Rowan M.D.   On: 04/02/2014  18:07      Medication  Impression: Hypokalemia Active Problems:   Hypertension   Thyroid disease   Diverticulitis of colon   Atrial fibrillation   Weakness generalized   Protein-calorie malnutrition, severe   CHF (congestive heart failure)   Diverticulosis   Gastroenteritis   Diverticulitis   Diverticulitis of colon with perforation     Plan: GI consult for antibiotic regimen and duration for eradication of C. difficile as well as chronic management of recurrent colitis. Monitor potassium daily advance to clear liquids  Consultants: GI   Procedures   Antibiotics: Oral vancomycin IV Flagyl IV Cipro IV Zosyn                  Code Status: Full   Family Communication:  Spoke with patient at length  Disposition Plan gastroenterology consultation  Time spent: 30 minutes   LOS: 2 days   Loreen Bankson M   03/30/2014, 1:50 PM

## 2014-03-30 NOTE — Care Management Utilization Note (Signed)
UR review complete.  

## 2014-03-30 NOTE — Plan of Care (Signed)
Problem: Consults Goal: General Medical Patient Education See Patient Education Module for specific education.  Outcome: Completed/Met Date Met:  03/30/14

## 2014-03-30 NOTE — Plan of Care (Signed)
Problem: Phase I Progression Outcomes Goal: OOB as tolerated unless otherwise ordered Outcome: Completed/Met Date Met:  03/30/14     

## 2014-03-30 NOTE — Plan of Care (Signed)
Problem: Phase I Progression Outcomes Goal: Voiding-avoid urinary catheter unless indicated Outcome: Completed/Met Date Met:  03/30/14

## 2014-03-30 NOTE — Progress Notes (Signed)
INITIAL NUTRITION ASSESSMENT  DOCUMENTATION CODES Per approved criteria  -Severe malnutrition in the context of chronic illness   INTERVENTION:  When pt is cleared for diet advancement: ProStat 30 ml TID (each 30 ml provides 100 kcal, 15 gr protein)   NUTRITION DIAGNOSIS: Inadequate oral intake related to altered GI function as evidenced by chronic unplanned weight loss 6% in 6 months and 13% in 9 months.  Goal: Prevent additional weight loss and improve nutrition status by consistently meeting >90% of estimated needs.  Monitor:  Protein-energy intake (meals and supplements), reveiw available labs and weight changes  Reason for Assessment: Malnutrition Screen   80 y.o. female  ASSESSMENT: Pt assessed by RD 09/15/13 during her admission for splenic infarct. At that time she c/o having  c. Diff since February and had shingles. She was eating a meal  with her husband at [place] on Friday which she describes as a few bites of green beans, potatoes and roast when she began to feel sick. Pt  says she has not eaten since. She presents : sigmoid diverticulitis with contained perforation and no acute surgery indicated per MD. Nutritionally pt has additional weight loss of 8#, 6% wt loss in 6 months since her last admission in May of this year. She has continued to drink ensure at least 1 x per day up until this acute episode.   Height: Ht Readings from Last 1 Encounters:  03/08/2014 5\' 7"  (1.702 m)    Weight: Wt Readings from Last 1 Encounters:  03/25/2014 136 lb (61.689 kg)    Ideal Body Weight: 135#  % Ideal Body Weight: 101%  Wt Readings from Last 10 Encounters:  04/04/2014 136 lb (61.689 kg)  02/25/14 138 lb 11.2 oz (62.914 kg)  09/13/13 144 lb 6.4 oz (65.5 kg)  09/10/13 146 lb (66.225 kg)  08/27/13 142 lb (64.411 kg)  07/17/13 137 lb (62.143 kg)  07/16/13 142 lb 14.4 oz (64.819 kg)  07/04/13 158 lb 1.1 oz (71.7 kg)  06/26/13 157 lb 6.5 oz (71.4 kg)  03/28/11 169 lb 6.4  oz (76.839 kg)    Usual Body Weight: 145# (six months ago) and 155-158# nine months ago (13% decr)  % Usual Body Weight: 94%  BMI:  Body mass index is 21.3 kg/(m^2). normal range  Estimated Nutritional Needs: Kcal: 1860-2100 (to prevent further weight loss) Protein: 80-90 gr Fluid: 1.8 liters daily  Skin: intact  Diet Order:   NPO for bowel rest  EDUCATION NEEDS: -No education needs identified at this time   Intake/Output Summary (Last 24 hours) at 03/30/14 0946 Last data filed at 03/30/14 0816  Gross per 24 hour  Intake 2390.96 ml  Output    200 ml  Net 2190.96 ml    Last BM: 11/22 diarrhea  Labs:   Recent Labs Lab 03/13/2014 1633 03/29/14 0644 03/30/14 0520  NA 139 140 141  K 3.5* 3.2* 3.0*  CL 99 101 103  CO2 26 23 22   BUN 15 12 10   CREATININE 0.91 0.99 0.86  CALCIUM 9.1 8.1* 8.1*  GLUCOSE 107* 134* 88    CBG (last 3)  No results for input(s): GLUCAP in the last 72 hours.  Scheduled Meds: . ciprofloxacin  400 mg Intravenous Q12H  . levothyroxine  50 mcg Intravenous Once per day on Sun Tue Wed Fri Sat  . metronidazole  500 mg Intravenous Q8H  . vancomycin  125 mg Oral 4 times per day    Continuous Infusions: . 0.9 % NaCl  with KCl 40 mEq / L 75 mL/hr at 03/30/14 0700  . heparin 950 Units/hr (03/30/14 0700)    Past Medical History  Diagnosis Date  . Osteoarthritis   . Macular degeneration   . Hypertension   . Thyroid disease   . Thyroid cancer   . GI bleed   . Atrial fibrillation   . C. difficile colitis   . Diverticulitis   . Shingles   . Lupus     Past Surgical History  Procedure Laterality Date  . Thyroidectomy    . Colonoscopy  03/17/2011    Procedure: COLONOSCOPY;  Surgeon: Rogene Houston, MD;  Location: [facility];  Service: Endoscopy;  Laterality: N/A;  . Cyst removal neck     Colman Cater MS,RD,CSG,LDN Office: [phone] Pager: [phone]

## 2014-03-31 LAB — BASIC METABOLIC PANEL
ANION GAP: 14 (ref 5–15)
BUN: 5 mg/dL — AB (ref 6–23)
CO2: 22 mEq/L (ref 19–32)
Calcium: 7.8 mg/dL — ABNORMAL LOW (ref 8.4–10.5)
Chloride: 104 mEq/L (ref 96–112)
Creatinine, Ser: 0.82 mg/dL (ref 0.50–1.10)
GFR, EST AFRICAN AMERICAN: 76 mL/min — AB (ref >90)
GFR, EST NON AFRICAN AMERICAN: 65 mL/min — AB (ref >90)
Glucose, Bld: 90 mg/dL (ref 70–99)
POTASSIUM: 3.1 meq/L — AB (ref 3.7–5.3)
Sodium: 140 mEq/L (ref 137–147)

## 2014-03-31 LAB — CBC
HCT: 28.3 % — ABNORMAL LOW (ref 36.0–46.0)
HEMOGLOBIN: 9.2 g/dL — AB (ref 12.0–15.0)
MCH: 26.7 pg (ref 26.0–34.0)
MCHC: 32.5 g/dL (ref 30.0–36.0)
MCV: 82.3 fL (ref 78.0–100.0)
Platelets: 485 10*3/uL — ABNORMAL HIGH (ref 150–400)
RBC: 3.44 MIL/uL — AB (ref 3.87–5.11)
RDW: 16.1 % — ABNORMAL HIGH (ref 11.5–15.5)
WBC: 7.1 10*3/uL (ref 4.0–10.5)

## 2014-03-31 LAB — HEPARIN LEVEL (UNFRACTIONATED): Heparin Unfractionated: 0.91 IU/mL — ABNORMAL HIGH (ref 0.30–0.70)

## 2014-03-31 LAB — APTT: APTT: 77 s — AB (ref 24–37)

## 2014-03-31 MED ORDER — POTASSIUM CHLORIDE CRYS ER 20 MEQ PO TBCR
20.0000 meq | EXTENDED_RELEASE_TABLET | Freq: Every day | ORAL | Status: AC
  Administered 2014-03-31 – 2014-04-01 (×2): 20 meq via ORAL
  Filled 2014-03-31 (×2): qty 1

## 2014-03-31 NOTE — Progress Notes (Signed)
Patient with C. difficile colitis perforation now sealed off with concomitant UTI currently on Cipro and vancomycin by mouth 250 every 6 hours Danielle [name] LPF:[id number] DOB: 02-21-1931 DOA: 03/26/2014 PCP: Danielle Curet, MD             Physical Exam: Blood pressure 123/72, pulse 111, temperature 99.2 F (37.3 C), temperature source Oral, resp. rate 20, height 5\' 7"  (1.702 m), weight 136 lb (61.689 kg), SpO2 94 %. no JVD no carotid bruits no thyromegaly lungs clear to A&P no rales wheeze or rhonchi appreciable heart irregularly regular no S3 auscultated no heaves thrills or rubs abdomen soft mild left lower quadrant discomfort to palpation bowel sounds normoactive no borborygmi no guarding or rebound   Investigations:  Recent Results (from the past 240 hour(s))  Clostridium Difficile by PCR     Status: Abnormal   Collection Time: 03/08/2014 10:22 PM  Result Value Ref Range Status   C difficile by pcr POSITIVE (A) NEGATIVE Final    Comment: CRITICAL RESULT CALLED TO, READ BACK BY AND VERIFIED WITH:  HAMILTON,S @ 5329 ON 03/16/2014 BY WOODIE,J      Basic Metabolic Panel:  Recent Labs  03/30/14 0520 03/31/14 0518  NA 141 140  K 3.0* 3.1*  CL 103 104  CO2 22 22  GLUCOSE 88 90  BUN 10 5*  CREATININE 0.86 0.82  CALCIUM 8.1* 7.8*   Liver Function Tests:  Recent Labs  04/04/2014 1633 03/29/14 0644  AST 12 11  ALT 8 6  ALKPHOS 116 93  BILITOT 0.6 0.9  PROT 8.1 6.6  ALBUMIN 3.2* 2.6*     CBC:  Recent Labs  03/25/2014 1633  03/30/14 0520 03/31/14 0518  WBC 10.7*  < > 10.1 7.1  NEUTROABS 8.3*  --   --   --   HGB 10.8*  < > 9.5* 9.2*  HCT 34.0*  < > 29.8* 28.3*  MCV 83.1  < > 82.5 82.3  PLT 642*  < > 513* 485*  < > = values in this interval not displayed.  No results found.    Medications:   Impression: Small microscopic perforation of colon no sealed off Active Problems:   Hypertension   Thyroid disease   Diverticulitis of colon   Atrial  fibrillation   Weakness generalized   Protein-calorie malnutrition, severe   CHF (congestive heart failure)   Diverticulosis   Gastroenteritis   Diverticulitis   Diverticulitis of colon with perforation     Plan: Continue current antibiotics monitor clinical course advance diet as tolerated KCl 20 mEq by mouth twice a day   Consultants: Gastroenterology and surgery   Procedures   Antibiotics: By mouth vancomycin 250 every 6 hours IV Cipro twice a day                  Code Status:   Family Communication:   Disposition Plan monitor CBC the med daily and clinical course  Time spent: 30 minutes   LOS: 3 days   Danielle [name] M   03/31/2014, 2:12 PM

## 2014-03-31 NOTE — Progress Notes (Signed)
Patient had a dark brown/black loose stool with a small amount of frank blood noted. Patient has no signs of distress at this time. Will continue to monitor.

## 2014-03-31 NOTE — Progress Notes (Signed)
ANTICOAGULATION CONSULT NOTE - follow up  Pharmacy Consult for Heparin Indication: atrial fibrillation, bridge therapy.  Allergies  Allergen Reactions  . Thyroid Hormones Hives and Rash    proparathyroid thyroid medication   Patient Measurements: Height: 5\' 7"  (170.2 cm) Weight: 136 lb (61.689 kg) IBW/kg (Calculated) : 61.6 Heparin Dosing Weight: 63 kg  Vital Signs: Temp: 99.2 F (37.3 C) (11/24 0557) Temp Source: Oral (11/24 0557) BP: 123/72 mmHg (11/24 0557) Pulse Rate: 111 (11/24 0557)  Labs:  Recent Labs  03/23/2014 1947  03/29/14 0644 03/29/14 0647  03/30/14 0520 03/30/14 1744 03/31/14 0518  HGB  --   --  9.4*  --   --  9.5*  --  9.2*  HCT  --   --  29.3*  --   --  29.8*  --  28.3*  PLT  --   --  495*  --   --  513*  --  485*  APTT  --   --  58*  --   < > 50* 76* 77*  LABPROT 18.0*  --  21.6*  --   --   --   --   --   INR 1.48  --  1.86*  --   --   --   --   --   HEPARINUNFRC  --   < >  --  >2.20*  --  1.54*  --  0.91*  CREATININE  --   --  0.99  --   --  0.86  --  0.82  < > = values in this interval not displayed.  Estimated Creatinine Clearance: 52.3 mL/min (by C-G formula based on Cr of 0.82).  Medical History: Past Medical History  Diagnosis Date  . Osteoarthritis   . Macular degeneration   . Hypertension   . Thyroid disease   . Thyroid cancer   . GI bleed   . Atrial fibrillation   . C. difficile colitis   . Diverticulitis   . Shingles   . Lupus    Medications:  Prescriptions prior to admission  Medication Sig Dispense Refill Last Dose  . amLODipine (NORVASC) 5 MG tablet Take 5 mg by mouth daily.   03/23/2014  . apixaban (ELIQUIS) 5 MG TABS tablet Take 1 tablet (5 mg total) by mouth 2 (two) times daily. 60 tablet 3 03/09/2014 at 11:30  . capsaicin (ZOSTRIX) 0.025 % cream Apply topically 2 (two) times daily. (Patient taking differently: Apply 1 application topically 2 (two) times daily as needed. ) 60 g 0 Past Month  . HYDROcodone-acetaminophen  (NORCO/VICODIN) 5-325 MG per tablet Take 1 tablet by mouth every 5 (five) hours as needed (pain).   03/29/2014  . levothyroxine (SYNTHROID, LEVOTHROID) 100 MCG tablet Take 100 mcg by mouth daily. Do not take synthroid on Mon and Thursdays   03/18/2014  . lisinopril (PRINIVIL,ZESTRIL) 10 MG tablet Take 1 tablet (10 mg total) by mouth daily. 30 tablet 3 03/27/2014  . metoprolol succinate (TOPROL-XL) 25 MG 24 hr tablet Take 12.5 mg by mouth daily.   03/20/2014 at 1130  . ondansetron (ZOFRAN) 4 MG tablet Take 4 mg by mouth every 8 (eight) hours as needed for nausea or vomiting.   03/31/2014  . pregabalin (LYRICA) 50 MG capsule Take 50 mg by mouth 3 (three) times daily as needed (Pain).    03/27/2014  . Probiotic Product (ALIGN PO) Take 1 tablet by mouth daily.   Past Week  . ranitidine (ZANTAC) 150 MG capsule Take 150  mg by mouth 2 (two) times daily.   03/26/2014  . Red Yeast Rice Extract (RED YEAST RICE PO) Take 1 capsule by mouth daily.   03/31/2014   Assessment: Okay for Protocol, admitted for Diverticulitis of colon with perforation.  Pt was on Apixaban @ home for Afib.  Therefore using aPTT to monitor Heparin therapy instead of heparin level.  APTT therapeutic x 2 consecutive checks.  Anit-Xa level remains elevated as expected due to recent Apixaban doses.  Nursing reports a small amount of blood in stool.  GI following.   Goal of Therapy:  Heparin level 0.3-0.7 units/ml aPTT 66-102 seconds Monitor platelets by anticoagulation protocol: Yes   Plan:  Continue Heparin at 1100 units/hr Daily PTT, CBC and anti-Xa level.  [place] Crane, Gloriann Riede A 03/31/2014,7:36 AM

## 2014-03-31 NOTE — Progress Notes (Signed)
  Subjective: States her abdominal pain is somewhat eased. Still having diarrhea. Does have some nausea.  Objective: Vital signs in last 24 hours: Temp:  [98.3 F (36.8 C)-99.5 F (37.5 C)] 99.2 F (37.3 C) (11/24 0557) Pulse Rate:  [103-111] 111 (11/24 0557) Resp:  [18-20] 20 (11/24 0557) BP: (108-132)/(64-72) 123/72 mmHg (11/24 0557) SpO2:  [94 %-100 %] 94 % (11/24 0557) Last BM Date: 03/30/14  Intake/Output from previous day: 11/23 0701 - 11/24 0700 In: 2849.8 [P.O.:600; I.V.:1949.8; IV Piggyback:300] Out: 600 [Urine:550; Stool:50] Intake/Output this shift:    General appearance: alert, cooperative and no distress GI: Soft, mildly distended. No specific tenderness noted. No rigidity noted.  Lab Results:   Recent Labs  03/30/14 0520 03/31/14 0518  WBC 10.1 7.1  HGB 9.5* 9.2*  HCT 29.8* 28.3*  PLT 513* 485*   BMET  Recent Labs  03/30/14 0520 03/31/14 0518  NA 141 140  K 3.0* 3.1*  CL 103 104  CO2 22 22  GLUCOSE 88 90  BUN 10 5*  CREATININE 0.86 0.82  CALCIUM 8.1* 7.8*   PT/INR  Recent Labs  03/09/2014 1947 03/29/14 0644  LABPROT 18.0* 21.6*  INR 1.48 1.86*    Studies/Results: No results found.  Anti-infectives: Anti-infectives    Start     Dose/Rate Route Frequency Ordered Stop   03/31/14 0000  vancomycin (VANCOCIN) 50 mg/mL oral solution 250 mg     250 mg Oral 4 times per day 03/30/14 2225     03/29/14 1200  vancomycin (VANCOCIN) 50 mg/mL oral solution 125 mg  Status:  Discontinued     125 mg Oral 4 times per day 03/29/14 0935 03/30/14 2225   03/29/14 1000  ciprofloxacin (CIPRO) IVPB 400 mg     400 mg200 mL/hr over 60 Minutes Intravenous Every 12 hours 03/29/14 0937     03/29/14 0500  metroNIDAZOLE (FLAGYL) IVPB 500 mg     500 mg100 mL/hr over 60 Minutes Intravenous Every 8 hours 03/14/2014 2337     03/26/2014 2100  metroNIDAZOLE (FLAGYL) IVPB 500 mg     500 mg100 mL/hr over 60 Minutes Intravenous  Once 03/24/2014 2030 04/01/2014 2216   04/04/2014  2000  piperacillin-tazobactam (ZOSYN) IVPB 3.375 g  Status:  Discontinued     3.375 g12.5 mL/hr over 240 Minutes Intravenous Every 8 hours 04/04/2014 1933 03/29/14 0937   03/12/2014 1900  ciprofloxacin (CIPRO) IVPB 400 mg     400 mg200 mL/hr over 60 Minutes Intravenous  Once 03/14/2014 1849 03/31/2014 2020   03/21/2014 1900  metroNIDAZOLE (FLAGYL) IVPB 500 mg  Status:  Discontinued     500 mg100 mL/hr over 60 Minutes Intravenous  Once 03/13/2014 1849 03/14/2014 2030      Assessment/Plan: Impression: Colitis with contained perforation, C. difficile colitis, clinically improving. Plan: No need for acute surgical intervention. Will follow peripherally with you.  LOS: 3 days    Danielle [name] A 03/31/2014

## 2014-03-31 NOTE — Progress Notes (Signed)
  Subjective: Patient continues complain of nausea but denies vomiting. She states abdominal pain has to creased    Objective: Blood pressure 123/72, pulse 111, temperature 99.2 F (37.3 C), temperature source Oral, resp. rate 20, height 5\' 7"  (1.702 m), weight 136 lb (61.689 kg), SpO2 94 %. Patient is alert and in no acute distress. She appears to be chronically ill. Conjunctiva is pink. Sclera is nonicteric Oropharyngeal mucosa is normal. No neck masses or thyromegaly noted. Cardiac exam with regular rhythm normal S1 and S2. No murmur or gallop noted. Lungs are clear to auscultation. Abdomen;  No LE edema or clubbing noted.  Labs/studies Results:   Recent Labs  03/29/14 0644 03/30/14 0520 03/31/14 0518  WBC 20.9* 10.1 7.1  HGB 9.4* 9.5* 9.2*  HCT 29.3* 29.8* 28.3*  PLT 495* 513* 485*    BMET   Recent Labs  03/29/14 0644 03/30/14 0520 03/31/14 0518  NA 140 141 140  K 3.2* 3.0* 3.1*  CL 101 103 104  CO2 23 22 22   GLUCOSE 134* 88 90  BUN 12 10 5*  CREATININE 0.99 0.86 0.82  CALCIUM 8.1* 8.1* 7.8*      Assessment:  #1. Sigmoid diverticulitis complicated by walled off perforation without abscess formation. Gradual symptomatic and objective improvement with IV ciprofloxacin and metronidazole. #2. C. difficile colitis. She is on by mouth vancomycin and saccharomyces boulardii. This condition is possibly a source of most of her constitutional symptoms. She will need therapy for at least 4 weeks in order to decrease relapse rate. #3. Anemia. No evidence of overt GI bleed. #4. Umblical hernia. It is not reducible but she does not havre pain.  Recommendations;  Advance diet to full liquids. CBC and metabolic 7 in a.m.

## 2014-04-01 ENCOUNTER — Ambulatory Visit (HOSPITAL_COMMUNITY): Payer: Medicare Other | Admitting: Physical Therapy

## 2014-04-01 DIAGNOSIS — A047 Enterocolitis due to Clostridium difficile: Secondary | ICD-10-CM

## 2014-04-01 LAB — BASIC METABOLIC PANEL
ANION GAP: 16 — AB (ref 5–15)
BUN: 4 mg/dL — ABNORMAL LOW (ref 6–23)
CO2: 20 meq/L (ref 19–32)
Calcium: 7.8 mg/dL — ABNORMAL LOW (ref 8.4–10.5)
Chloride: 103 mEq/L (ref 96–112)
Creatinine, Ser: 0.85 mg/dL (ref 0.50–1.10)
GFR calc Af Amer: 72 mL/min — ABNORMAL LOW (ref >90)
GFR calc non Af Amer: 63 mL/min — ABNORMAL LOW (ref >90)
Glucose, Bld: 96 mg/dL (ref 70–99)
Potassium: 3.3 mEq/L — ABNORMAL LOW (ref 3.7–5.3)
SODIUM: 139 meq/L (ref 137–147)

## 2014-04-01 LAB — CBC
HCT: 28.6 % — ABNORMAL LOW (ref 36.0–46.0)
HEMOGLOBIN: 9.1 g/dL — AB (ref 12.0–15.0)
MCH: 26.3 pg (ref 26.0–34.0)
MCHC: 31.8 g/dL (ref 30.0–36.0)
MCV: 82.7 fL (ref 78.0–100.0)
Platelets: 513 10*3/uL — ABNORMAL HIGH (ref 150–400)
RBC: 3.46 MIL/uL — ABNORMAL LOW (ref 3.87–5.11)
RDW: 16.1 % — ABNORMAL HIGH (ref 11.5–15.5)
WBC: 9.8 10*3/uL (ref 4.0–10.5)

## 2014-04-01 LAB — RETICULOCYTES
RBC.: 3.64 MIL/uL — AB (ref 3.87–5.11)
RETIC COUNT ABSOLUTE: 36.4 10*3/uL (ref 19.0–186.0)
Retic Ct Pct: 1 % (ref 0.4–3.1)

## 2014-04-01 LAB — CLOSTRIDIUM DIFFICILE BY PCR: Toxigenic C. Difficile by PCR: POSITIVE — AB

## 2014-04-01 LAB — HEPARIN LEVEL (UNFRACTIONATED): HEPARIN UNFRACTIONATED: 0.5 [IU]/mL (ref 0.30–0.70)

## 2014-04-01 LAB — APTT: aPTT: 78 seconds — ABNORMAL HIGH (ref 24–37)

## 2014-04-01 MED ORDER — MENTHOL 3 MG MT LOZG
1.0000 | LOZENGE | OROMUCOSAL | Status: DC | PRN
  Filled 2014-04-01 (×2): qty 9

## 2014-04-01 MED ORDER — LORAZEPAM 0.5 MG PO TABS
0.5000 mg | ORAL_TABLET | Freq: Every day | ORAL | Status: DC
  Administered 2014-04-01: 0.5 mg via ORAL
  Filled 2014-04-01: qty 1

## 2014-04-01 MED ORDER — POTASSIUM CHLORIDE CRYS ER 20 MEQ PO TBCR
40.0000 meq | EXTENDED_RELEASE_TABLET | Freq: Every day | ORAL | Status: AC
  Administered 2014-04-02: 40 meq via ORAL
  Filled 2014-04-01: qty 2

## 2014-04-01 NOTE — Progress Notes (Signed)
CRITICAL VALUE ALERT  Critical value received:  C. Diff positive  Date of notification:  04/01/14   Time of notification:  5681  Critical value read back:Yes.    Nurse who received alert:  Di Kindle  MD notified (1st page):  [place]  Time of first page:  1708  MD notified (2nd page):  Time of second page:  Responding MD:  Cindie Laroche  Time MD responded:  1710

## 2014-04-01 NOTE — Progress Notes (Signed)
[facility] for Heparin Indication: atrial fibrillation, bridge therapy.  Allergies  Allergen Reactions  . Thyroid Hormones Hives and Rash    proparathyroid thyroid medication   Patient Measurements: Height: 5\' 7"  (170.2 cm) Weight: 136 lb (61.689 kg) IBW/kg (Calculated) : 61.6  Vital Signs: Temp: 98.1 F (36.7 C) (11/25 0626) Temp Source: Oral (11/25 0626) BP: 121/83 mmHg (11/25 0626) Pulse Rate: 90 (11/25 0626)  Labs:  Recent Labs  03/30/14 0520 03/30/14 1744 03/31/14 0518 04/01/14 0522  HGB 9.5*  --  9.2* 9.1*  HCT 29.8*  --  28.3* 28.6*  PLT 513*  --  485* 513*  APTT 50* 76* 77* 78*  HEPARINUNFRC 1.54*  --  0.91* 0.50  CREATININE 0.86  --  0.82 0.85    Estimated Creatinine Clearance: 50.5 mL/min (by C-G formula based on Cr of 0.85).  Medical History: Past Medical History  Diagnosis Date  . Osteoarthritis   . Macular degeneration   . Hypertension   . Thyroid disease   . Thyroid cancer   . GI bleed   . Atrial fibrillation   . C. difficile colitis   . Diverticulitis   . Shingles   . Lupus    Medications:  Prescriptions prior to admission  Medication Sig Dispense Refill Last Dose  . amLODipine (NORVASC) 5 MG tablet Take 5 mg by mouth daily.   03/26/2014  . apixaban (ELIQUIS) 5 MG TABS tablet Take 1 tablet (5 mg total) by mouth 2 (two) times daily. 60 tablet 3 03/25/2014 at 11:30  . capsaicin (ZOSTRIX) 0.025 % cream Apply topically 2 (two) times daily. (Patient taking differently: Apply 1 application topically 2 (two) times daily as needed. ) 60 g 0 Past Month  . HYDROcodone-acetaminophen (NORCO/VICODIN) 5-325 MG per tablet Take 1 tablet by mouth every 5 (five) hours as needed (pain).   03/25/2014  . levothyroxine (SYNTHROID, LEVOTHROID) 100 MCG tablet Take 100 mcg by mouth daily. Do not take synthroid on Mon and Thursdays   03/21/2014  . lisinopril (PRINIVIL,ZESTRIL) 10 MG tablet Take 1 tablet (10 mg total) by mouth  daily. 30 tablet 3 03/18/2014  . metoprolol succinate (TOPROL-XL) 25 MG 24 hr tablet Take 12.5 mg by mouth daily.   03/11/2014 at 1130  . ondansetron (ZOFRAN) 4 MG tablet Take 4 mg by mouth every 8 (eight) hours as needed for nausea or vomiting.   03/27/2014  . pregabalin (LYRICA) 50 MG capsule Take 50 mg by mouth 3 (three) times daily as needed (Pain).    03/27/2014  . Probiotic Product (ALIGN PO) Take 1 tablet by mouth daily.   Past Week  . ranitidine (ZANTAC) 150 MG capsule Take 150 mg by mouth 2 (two) times daily.   03/18/2014  . Red Yeast Rice Extract (RED YEAST RICE PO) Take 1 capsule by mouth daily.   04/02/2014   Assessment: 40 yoF admitted with diverticulitis with contained perforation.  She was on Apixaban @ home for Afib. She remains NPO due to severe nausea & vomiting so is currently being bridged with IV heparin.  APTT and Heparin level are therapeutic this morning.   No evidence of overt GI bleeding.    Goal of Therapy:  Heparin level 0.3-0.7 Monitor platelets by anticoagulation protocol: Yes   Plan:  Continue Heparin at 1100 units/hr Daily heparin level & CBC while on heparin Discontinue aPTT monitoring  Joscelyne Renville, Lavonia Drafts 04/01/2014,11:49 AM

## 2014-04-01 NOTE — Progress Notes (Signed)
Patient with sigmoid diverticulitis status post perforation now walled off concomitant UTI chronic A. fib C. difficile colitis currently controlled on IV Cipro thank and Flagyl and has chronic complaints of dyspepsia and nausea left lower quadrant discomfort diminishing Danielle [name]:[id number] DOB: [birth year] DOA: 03/16/2014 PCP: [name], MD             Physical Exam: Blood pressure 121/83, pulse 90, temperature 98.1 F (36.7 C), temperature source Oral, resp. rate 20, height 5\' 7"  (1.702 m), weight 136 lb (61.689 kg), SpO2 92 %. no JVD no carotid bruits no thyromegaly lungs clear to A&P no rales wheeze rhonchi heart irregular regular no S3 auscultated no heaves thrills or rubs abdomen soft bowel sounds normoactive no peristaltic rushes no guarding or rebound   Investigations:  Recent Results (from the past 240 hour(s))  Clostridium Difficile by PCR     Status: Abnormal   Collection Time: 03/25/2014 10:22 PM  Result Value Ref Range Status   C difficile by pcr POSITIVE (A) NEGATIVE Final    Comment: CRITICAL RESULT CALLED TO, READ BACK BY AND VERIFIED WITH:  HAMILTON,S @ 6948 ON 03/08/2014 BY WOODIE,J      Basic Metabolic Panel:  Recent Labs  03/31/14 0518 04/01/14 0522  NA 140 139  K 3.1* 3.3*  CL 104 103  CO2 22 20  GLUCOSE 90 96  BUN 5* 4*  CREATININE 0.82 0.85  CALCIUM 7.8* 7.8*   Liver Function Tests: No results for input(s): AST, ALT, ALKPHOS, BILITOT, PROT, ALBUMIN in the last 72 hours.   CBC:  Recent Labs  03/31/14 0518 04/01/14 0522  WBC 7.1 9.8  HGB 9.2* 9.1*  HCT 28.3* 28.6*  MCV 82.3 82.7  PLT 485* 513*    No results found.    Medications:   Impression: UTI Active Problems:   Hypertension   Thyroid disease   Diverticulitis of colon   Atrial fibrillation   Weakness generalized   Protein-calorie malnutrition, severe   CHF (congestive heart failure)   Diverticulosis   Gastroenteritis   Diverticulitis  Diverticulitis of colon with perforation     Plan: KCl 40 mEq by mouth daily monitor the med and CBC daily continue heparin continue Flagyl Cipro and vancomycin by mouth  Consultants: Dr. Arnoldo Morale surgery Dr. Melony Overly gastroneurology   Procedures   Antibiotics: Cipro Flagyl IV and vancomycin 250 every 6 hours when necessary                  Code Status:  Family Communication: Spoke with patient and husband  Disposition Plan monitor CBC B med daily point to clinical course advanced islet diet as tolerated to full liquids anemia profile ordered today  Time spent: 30 minutes   LOS: 4 days   Danielle [name] M   04/01/2014, 1:44 PM

## 2014-04-01 NOTE — Progress Notes (Signed)
Pt is very nauseated and in the bed with dry heaves.  She declines PT eval today, however she does report weakness.  We will plan to see her on Friday.

## 2014-04-01 NOTE — Progress Notes (Signed)
Subjective: Since I last evaluated the patient  Last meal was last Thursday. States she is unable to eat because of the nausea and vomiting.  Vomited green-yellow bile this morning. She says she is able to keep Ensure down as well as water.   She has had one stool today which she says was formed.  She had one stool yesterday.     C/o sorethroat this am    Objective: Vital signs in last 24 hours: Temp:  [98.1 F (36.7 C)-98.5 F (36.9 C)] 98.1 F (36.7 C) (11/25 0626) Pulse Rate:  [90-109] 90 (11/25 0626) Resp:  [18-20] 20 (11/25 0626) BP: (121-132)/(73-92) 121/83 mmHg (11/25 0626) SpO2:  [92 %-96 %] 92 % (11/25 0626) Last BM Date: 03/31/14  Intake/Output from previous day: 11/24 0701 - 11/25 0700 In: 1200 [P.O.:100; IV Piggyback:1100] Out: 200 [Urine:200] Intake/Output this shift:    General appearance: alert Alert. Mucous membranes dry. Lungs clear. HR regular. Abdomen soft. BS+. Tenderness rt and left lower quadrant. Tenderness at umbilicus. No edema to extremities..  Lab Results:  Recent Labs  03/30/14 0520 03/31/14 0518 04/01/14 0522  WBC 10.1 7.1 9.8  HGB 9.5* 9.2* 9.1*  HCT 29.8* 28.3* 28.6*  PLT 513* 485* 513*   BMET  Recent Labs  03/30/14 0520 03/31/14 0518 04/01/14 0522  NA 141 140 139  K 3.0* 3.1* 3.3*  CL 103 104 103  CO2 22 22 20   GLUCOSE 88 90 96  BUN 10 5* 4*  CREATININE 0.86 0.82 0.85  CALCIUM 8.1* 7.8* 7.8*   LFT No results for input(s): PROT, ALBUMIN, AST, ALT, ALKPHOS, BILITOT, BILIDIR, IBILI in the last 72 hours. PT/INR No results for input(s): LABPROT, INR in the last 72 hours. Hepatitis Panel No results for input(s): HEPBSAG, HCVAB, HEPAIGM, HEPBIGM in the last 72 hours. C-Diff No results for input(s): CDIFFTOX in the last 72 hours. No results for input(s): CDIFFPCR in the last 72 hours. Fecal Lactopherrin No results for input(s): FECLLACTOFRN in the last 72 hours.  Studies/Results: No results found.  Medications: I have  reviewed the patient's current medications.  Assessment/Plan  #1. Sigmoid diverticulitis complicated by walled off perforation without abscess formation. Abdominal pain has improves. C/o soreness. Covered with Cipro and Flagyl.  #2. C. difficile colitis. Continue Vancomycin #3. Anemia. No evidence of overt GI bleed. #4. Umblical hernia. Slight tenderness to area.      LOS: 4 days   Ronnette Rump W 04/01/2014, 7:49 AM

## 2014-04-02 ENCOUNTER — Inpatient Hospital Stay (HOSPITAL_COMMUNITY): Payer: Medicare Other

## 2014-04-02 DIAGNOSIS — R0689 Other abnormalities of breathing: Secondary | ICD-10-CM

## 2014-04-02 LAB — BLOOD GAS, ARTERIAL
ACID-BASE DEFICIT: 16.3 mmol/L — AB (ref 0.0–2.0)
ACID-BASE DEFICIT: 21 mmol/L — AB (ref 0.0–2.0)
Acid-base deficit: 22.5 mmol/L — ABNORMAL HIGH (ref 0.0–2.0)
BICARBONATE: 6.9 meq/L — AB (ref 20.0–24.0)
Bicarbonate: 10.9 mEq/L — ABNORMAL LOW (ref 20.0–24.0)
Bicarbonate: 8 mEq/L — ABNORMAL LOW (ref 20.0–24.0)
DRAWN BY: 234301
Drawn by: 234301
Drawn by: 22223
FIO2: 100 %
FIO2: 100 %
MECHVT: 550 mL
O2 CONTENT: 5 L/min
O2 SAT: 87.2 %
O2 SAT: 92.8 %
O2 Saturation: 86.2 %
PATIENT TEMPERATURE: 37
PCO2 ART: 33 mmHg — AB (ref 35.0–45.0)
PCO2 ART: 32.1 mmHg — AB (ref 35.0–45.0)
PEEP: 0 cmH2O
PO2 ART: 110 mmHg — AB (ref 80.0–100.0)
Patient temperature: 37
Patient temperature: 37
RATE: 15 resp/min
RATE: 18 resp/min
TCO2: 10.8 mmol/L (ref 0–100)
TCO2: 8.3 mmol/L (ref 0–100)
TCO2: 7.3 mmol/L (ref 0–100)
VT: 530 mL
pCO2 arterial: 34.6 mmHg — ABNORMAL LOW (ref 35.0–45.0)
pH, Arterial: 7.145 — CL (ref 7.350–7.450)
pH, Arterial: 6.996 — CL (ref 7.350–7.450)
pH, Arterial: 6.963 — CL (ref 7.350–7.450)
pO2, Arterial: 73.7 mmHg — ABNORMAL LOW (ref 80.0–100.0)
pO2, Arterial: 79.1 mmHg — ABNORMAL LOW (ref 80.0–100.0)

## 2014-04-02 LAB — BASIC METABOLIC PANEL
ANION GAP: 17 — AB (ref 5–15)
Anion gap: 27 — ABNORMAL HIGH (ref 5–15)
Anion gap: 30 — ABNORMAL HIGH (ref 5–15)
BUN: 9 mg/dL (ref 6–23)
BUN: 10 mg/dL (ref 6–23)
BUN: 11 mg/dL (ref 6–23)
CO2: 18 mEq/L — ABNORMAL LOW (ref 19–32)
CO2: 9 mEq/L — CL (ref 19–32)
CO2: 6 meq/L — AB (ref 19–32)
Calcium: 8.2 mg/dL — ABNORMAL LOW (ref 8.4–10.5)
Calcium: 7.3 mg/dL — ABNORMAL LOW (ref 8.4–10.5)
Calcium: 7.1 mg/dL — ABNORMAL LOW (ref 8.4–10.5)
Chloride: 104 mEq/L (ref 96–112)
Chloride: 105 mEq/L (ref 96–112)
Chloride: 102 mEq/L (ref 96–112)
Creatinine, Ser: 1.09 mg/dL (ref 0.50–1.10)
Creatinine, Ser: 1.3 mg/dL — ABNORMAL HIGH (ref 0.50–1.10)
Creatinine, Ser: 1.5 mg/dL — ABNORMAL HIGH (ref 0.50–1.10)
GFR calc Af Amer: 54 mL/min — ABNORMAL LOW (ref >90)
GFR calc Af Amer: 43 mL/min — ABNORMAL LOW (ref >90)
GFR calc Af Amer: 36 mL/min — ABNORMAL LOW (ref >90)
GFR calc non Af Amer: 46 mL/min — ABNORMAL LOW (ref >90)
GFR, EST NON AFRICAN AMERICAN: 37 mL/min — AB (ref >90)
GFR, EST NON AFRICAN AMERICAN: 31 mL/min — AB (ref >90)
GLUCOSE: 112 mg/dL — AB (ref 70–99)
GLUCOSE: 67 mg/dL — AB (ref 70–99)
GLUCOSE: 77 mg/dL (ref 70–99)
POTASSIUM: 5.5 meq/L — AB (ref 3.7–5.3)
POTASSIUM: 5.9 meq/L — AB (ref 3.7–5.3)
Potassium: 4 mEq/L (ref 3.7–5.3)
SODIUM: 139 meq/L (ref 137–147)
Sodium: 141 mEq/L (ref 137–147)
Sodium: 138 mEq/L (ref 137–147)

## 2014-04-02 LAB — CBC
HEMATOCRIT: 30.7 % — AB (ref 36.0–46.0)
Hemoglobin: 9.6 g/dL — ABNORMAL LOW (ref 12.0–15.0)
MCH: 25.8 pg — ABNORMAL LOW (ref 26.0–34.0)
MCHC: 31.3 g/dL (ref 30.0–36.0)
MCV: 82.5 fL (ref 78.0–100.0)
Platelets: 512 10*3/uL — ABNORMAL HIGH (ref 150–400)
RBC: 3.72 MIL/uL — ABNORMAL LOW (ref 3.87–5.11)
RDW: 16.3 % — AB (ref 11.5–15.5)
WBC: 10.5 10*3/uL (ref 4.0–10.5)

## 2014-04-02 LAB — IRON AND TIBC
IRON: 14 ug/dL — AB (ref 42–135)
Saturation Ratios: 9 % — ABNORMAL LOW (ref 20–55)
TIBC: 160 ug/dL — ABNORMAL LOW (ref 250–470)
UIBC: 146 ug/dL (ref 125–400)

## 2014-04-02 LAB — FOLATE: Folate: >20 ng/mL

## 2014-04-02 LAB — LACTIC ACID, PLASMA: Lactic Acid, Venous: 14.3 mmol/L — ABNORMAL HIGH (ref 0.5–2.2)

## 2014-04-02 LAB — HEPARIN LEVEL (UNFRACTIONATED): HEPARIN UNFRACTIONATED: 0.33 [IU]/mL (ref 0.30–0.70)

## 2014-04-02 LAB — D-DIMER, QUANTITATIVE (NOT AT ARMC): D DIMER QUANT: 3.27 ug{FEU}/mL — AB (ref 0.00–0.48)

## 2014-04-02 LAB — TROPONIN I: Troponin I: 0.6 ng/mL (ref <0.30)

## 2014-04-02 LAB — PHOSPHORUS: PHOSPHORUS: 5 mg/dL — AB (ref 2.3–4.6)

## 2014-04-02 LAB — PRO B NATRIURETIC PEPTIDE: PRO B NATRI PEPTIDE: 25794 pg/mL — AB (ref 0–450)

## 2014-04-02 LAB — FERRITIN: FERRITIN: 240 ng/mL (ref 10–291)

## 2014-04-02 LAB — VITAMIN B12: Vitamin B-12: 1112 pg/mL — ABNORMAL HIGH (ref 211–911)

## 2014-04-02 LAB — PROCALCITONIN: Procalcitonin: 1.22 ng/mL

## 2014-04-02 LAB — MAGNESIUM: Magnesium: 1.6 mg/dL (ref 1.5–2.5)

## 2014-04-02 MED ORDER — FENTANYL CITRATE 0.05 MG/ML IJ SOLN
50.0000 ug | INTRAMUSCULAR | Status: DC | PRN
  Administered 2014-04-02 – 2014-04-04 (×7): 50 ug via INTRAVENOUS
  Filled 2014-04-02 (×2): qty 2

## 2014-04-02 MED ORDER — FUROSEMIDE 10 MG/ML IJ SOLN
20.0000 mg | Freq: Once | INTRAMUSCULAR | Status: AC
  Administered 2014-04-02: 20 mg via INTRAVENOUS
  Filled 2014-04-02: qty 2

## 2014-04-02 MED ORDER — CITALOPRAM HYDROBROMIDE 20 MG PO TABS
20.0000 mg | ORAL_TABLET | Freq: Every day | ORAL | Status: DC
  Administered 2014-04-02 – 2014-04-04 (×2): 20 mg via ORAL
  Filled 2014-04-02 (×2): qty 1

## 2014-04-02 MED ORDER — NOREPINEPHRINE BITARTRATE 1 MG/ML IV SOLN
0.0000 ug/min | INTRAVENOUS | Status: DC
  Administered 2014-04-02: 3 ug/min via INTRAVENOUS
  Administered 2014-04-02 – 2014-04-03 (×2): 40 ug/min via INTRAVENOUS
  Administered 2014-04-04: 2 ug/min via INTRAVENOUS
  Administered 2014-04-05: 40 ug/min via INTRAVENOUS
  Filled 2014-04-02: qty 16

## 2014-04-02 MED ORDER — ALBUTEROL SULFATE (2.5 MG/3ML) 0.083% IN NEBU
2.5000 mg | INHALATION_SOLUTION | Freq: Four times a day (QID) | RESPIRATORY_TRACT | Status: DC | PRN
  Administered 2014-04-02: 2.5 mg via RESPIRATORY_TRACT
  Filled 2014-04-02 (×2): qty 3

## 2014-04-02 MED ORDER — LORAZEPAM 2 MG/ML IJ SOLN
INTRAMUSCULAR | Status: AC
  Filled 2014-04-02: qty 1

## 2014-04-02 MED ORDER — FENTANYL CITRATE 0.05 MG/ML IJ SOLN
50.0000 ug | INTRAMUSCULAR | Status: AC | PRN
  Administered 2014-04-02 – 2014-04-03 (×3): 50 ug via INTRAVENOUS
  Filled 2014-04-02 (×3): qty 2

## 2014-04-02 MED ORDER — CETYLPYRIDINIUM CHLORIDE 0.05 % MT LIQD
7.0000 mL | Freq: Four times a day (QID) | OROMUCOSAL | Status: DC
  Administered 2014-04-03 – 2014-04-04 (×7): 7 mL via OROMUCOSAL

## 2014-04-02 MED ORDER — DOPAMINE-DEXTROSE 3.2-5 MG/ML-% IV SOLN
0.0000 ug/kg/min | INTRAVENOUS | Status: DC
  Administered 2014-04-02 – 2014-04-03 (×2): 20 ug/kg/min via INTRAVENOUS
  Filled 2014-04-02 (×2): qty 250

## 2014-04-02 MED ORDER — SODIUM BICARBONATE 8.4 % IV SOLN
INTRAVENOUS | Status: AC
  Filled 2014-04-02: qty 150

## 2014-04-02 MED ORDER — DOPAMINE-DEXTROSE 3.2-5 MG/ML-% IV SOLN
INTRAVENOUS | Status: AC
  Filled 2014-04-02: qty 250

## 2014-04-02 MED ORDER — SODIUM CHLORIDE 0.9 % IJ SOLN: 10.0000 mL | INTRAMUSCULAR | Status: DC | PRN

## 2014-04-02 MED ORDER — FUROSEMIDE 10 MG/ML IJ SOLN
40.0000 mg | Freq: Once | INTRAMUSCULAR | Status: AC
  Administered 2014-04-02: 40 mg via INTRAVENOUS
  Filled 2014-04-02: qty 4

## 2014-04-02 MED ORDER — SODIUM BICARBONATE 8.4 % IV SOLN
INTRAVENOUS | Status: DC
  Administered 2014-04-02 – 2014-04-03 (×3): via INTRAVENOUS
  Filled 2014-04-02 (×12): qty 150

## 2014-04-02 MED ORDER — METOPROLOL TARTRATE 1 MG/ML IV SOLN: 5.0000 mg | INTRAVENOUS | Status: DC | PRN

## 2014-04-02 MED ORDER — METOPROLOL TARTRATE 25 MG PO TABS
25.0000 mg | ORAL_TABLET | Freq: Two times a day (BID) | ORAL | Status: DC
  Administered 2014-04-02: 25 mg via ORAL
  Filled 2014-04-02: qty 1

## 2014-04-02 MED ORDER — LORAZEPAM 0.5 MG PO TABS
1.0000 mg | ORAL_TABLET | Freq: Every day | ORAL | Status: DC
  Administered 2014-04-03 – 2014-04-04 (×2): 1 mg via ORAL
  Filled 2014-04-02 (×2): qty 2

## 2014-04-02 MED ORDER — NOREPINEPHRINE BITARTRATE 1 MG/ML IV SOLN
INTRAVENOUS | Status: AC
  Filled 2014-04-02: qty 16

## 2014-04-02 MED ORDER — CHLORHEXIDINE GLUCONATE 0.12 % MT SOLN
15.0000 mL | Freq: Two times a day (BID) | OROMUCOSAL | Status: DC
  Administered 2014-04-02 – 2014-04-05 (×7): 15 mL via OROMUCOSAL
  Filled 2014-04-02 (×7): qty 15

## 2014-04-02 MED ORDER — SODIUM BICARBONATE 8.4 % IV SOLN: 25.0000 meq | Freq: Once | INTRAVENOUS | Status: AC

## 2014-04-02 MED ORDER — LORAZEPAM 2 MG/ML IJ SOLN
1.0000 mg | INTRAMUSCULAR | Status: DC | PRN
  Administered 2014-04-02: 1 mg via INTRAVENOUS

## 2014-04-02 MED ORDER — FERROUS SULFATE 325 (65 FE) MG PO TABS
325.0000 mg | ORAL_TABLET | Freq: Every day | ORAL | Status: DC
  Administered 2014-04-02: 325 mg via ORAL
  Filled 2014-04-02: qty 1

## 2014-04-02 MED ORDER — FUROSEMIDE 10 MG/ML IJ SOLN
40.0000 mg | INTRAMUSCULAR | Status: AC
  Administered 2014-04-02: 40 mg via INTRAVENOUS
  Filled 2014-04-02: qty 4

## 2014-04-02 MED ORDER — SODIUM CHLORIDE 0.9 % IJ SOLN
10.0000 mL | Freq: Two times a day (BID) | INTRAMUSCULAR | Status: DC
  Administered 2014-04-03: 10 mL
  Administered 2014-04-03: 40 mL
  Administered 2014-04-04: 10 mL
  Administered 2014-04-04: 40 mL

## 2014-04-02 NOTE — Progress Notes (Signed)
Patient transfer to ICU via bed. Dr. Cindie Laroche notified of patient stop breathing and CPR initiated. Dr. Cindie Laroche on the telephone while CPR was in progress. Dr. Cindie Laroche spoke with Dr. Wendee Beavers via telephone

## 2014-04-02 NOTE — Progress Notes (Addendum)
Patient remains anxious and restless. Foley catheter inserted scant amount urine noted. Ativan given IV by ICU nurse for restlessness patient try ito get out of bed.

## 2014-04-02 NOTE — Progress Notes (Signed)
Patient c/o feeling short of breath. O2 saturation obtain 94% on room air. Dr. Laural Golden in see patient. Ordered Lasix 20mg  IV. O2 2l/min applied via nasal cannula resp 20. Patient sitting on side of bed. Husband at bedside.

## 2014-04-02 NOTE — Progress Notes (Signed)
Notified Respiratory for breathing treatment to be given unable to given treatment due to heart rate of 155. O2 increase by Respiratory due to lower O2 sat. Notified Dr. Cindie Laroche regarding heart rate and patient states she feel short of breath. New order for EKG and lopressor 25mg  po.

## 2014-04-02 NOTE — Progress Notes (Addendum)
Patient skin color pale patient stop breathing no pulse. CPR started and code called. Dr.Vega and ED MD responded to code.

## 2014-04-02 NOTE — Progress Notes (Signed)
[facility] for Heparin Indication: atrial fibrillation, bridge therapy.  Allergies  Allergen Reactions  . Thyroid Hormones Hives and Rash    proparathyroid thyroid medication   Patient Measurements: Height: 5\' 7"  (170.2 cm) Weight: 136 lb (61.689 kg) IBW/kg (Calculated) : 61.6  Vital Signs: Temp: 98 F (36.7 C) (11/26 0553) Temp Source: Oral (11/26 0553) BP: 121/80 mmHg (11/26 0917) Pulse Rate: 153 (11/26 0917)  Labs:  Recent Labs  03/30/14 1744  03/31/14 0518 04/01/14 0522 04/02/14 0543  HGB  --   < > 9.2* 9.1* 9.6*  HCT  --   --  28.3* 28.6* 30.7*  PLT  --   --  485* 513* 512*  APTT 76*  --  77* 78*  --   HEPARINUNFRC  --   --  0.91* 0.50 0.33  CREATININE  --   --  0.82 0.85 1.09  < > = values in this interval not displayed.  Estimated Creatinine Clearance: 39.4 mL/min (by C-G formula based on Cr of 1.09).  Medical History: Past Medical History  Diagnosis Date  . Osteoarthritis   . Macular degeneration   . Hypertension   . Thyroid disease   . Thyroid cancer   . GI bleed   . Atrial fibrillation   . C. difficile colitis   . Diverticulitis   . Shingles   . Lupus    Medications:  Prescriptions prior to admission  Medication Sig Dispense Refill Last Dose  . amLODipine (NORVASC) 5 MG tablet Take 5 mg by mouth daily.   04/04/2014  . apixaban (ELIQUIS) 5 MG TABS tablet Take 1 tablet (5 mg total) by mouth 2 (two) times daily. 60 tablet 3 03/31/2014 at 11:30  . capsaicin (ZOSTRIX) 0.025 % cream Apply topically 2 (two) times daily. (Patient taking differently: Apply 1 application topically 2 (two) times daily as needed. ) 60 g 0 Past Month  . HYDROcodone-acetaminophen (NORCO/VICODIN) 5-325 MG per tablet Take 1 tablet by mouth every 5 (five) hours as needed (pain).   03/11/2014  . levothyroxine (SYNTHROID, LEVOTHROID) 100 MCG tablet Take 100 mcg by mouth daily. Do not take synthroid on Mon and Thursdays   03/27/2014  .  lisinopril (PRINIVIL,ZESTRIL) 10 MG tablet Take 1 tablet (10 mg total) by mouth daily. 30 tablet 3 03/30/2014  . metoprolol succinate (TOPROL-XL) 25 MG 24 hr tablet Take 12.5 mg by mouth daily.   03/14/2014 at 1130  . ondansetron (ZOFRAN) 4 MG tablet Take 4 mg by mouth every 8 (eight) hours as needed for nausea or vomiting.   03/27/2014  . pregabalin (LYRICA) 50 MG capsule Take 50 mg by mouth 3 (three) times daily as needed (Pain).    03/27/2014  . Probiotic Product (ALIGN PO) Take 1 tablet by mouth daily.   Past Week  . ranitidine (ZANTAC) 150 MG capsule Take 150 mg by mouth 2 (two) times daily.   03/21/2014  . Red Yeast Rice Extract (RED YEAST RICE PO) Take 1 capsule by mouth daily.   03/22/2014   Assessment: 70 yoF admitted with diverticulitis with contained perforation.  She was on Apixaban @ home for Afib. She remains NPO due to severe nausea & vomiting so is currently being bridged with IV heparin.  Heparin level  therapeutic this morning.   No evidence of overt GI bleeding.    Goal of Therapy:  Heparin level 0.3-0.7 Monitor platelets by anticoagulation protocol: Yes   Plan:  Continue Heparin at 1100 units/hr Daily heparin level &  CBC while on heparin   Abner Greenspan, Nikitas Davtyan Bennett 04/02/2014,10:04 AM

## 2014-04-02 NOTE — Progress Notes (Signed)
Notified Dr. Cindie Laroche of EKG results new orders for lopressor IV , Chest x-ray, and telemetry. Telemetry unit applied. Lopressor given IV. Portable chest x-ray done.Danielle [name]

## 2014-04-02 NOTE — Progress Notes (Signed)
Patient appears anxious complaints of some dyspnea is chronic anxiety states she didn't sleep all no GI complaints states she would like to eat Danielle [name] RDE:[id number] DOB: [birth year] DOA: 03/30/2014 PCP: [name], MD             Physical Exam: Blood pressure 122/74, pulse 155, temperature 98 F (36.7 C), temperature source Oral, resp. rate 20, height 5\' 7"  (1.702 m), weight 136 lb (61.689 kg), SpO2 92 %. no JVD no carotid bruit no thyromegaly lungs clear to A&P no rales or rhonchi appreciable heart irregularly regular no S3 no heave or rubs abdomen soft nontender bowel sounds normoactive no peristaltic rushes   Investigations:  Recent Results (from the past 240 hour(s))  Clostridium Difficile by PCR     Status: Abnormal   Collection Time: 04/06/2014 10:22 PM  Result Value Ref Range Status   C difficile by pcr POSITIVE (A) NEGATIVE Final    Comment: CRITICAL RESULT CALLED TO, READ BACK BY AND VERIFIED WITH:  HAMILTON,S @ 6314 ON 03/27/2014 BY WOODIE,J   Clostridium Difficile by PCR     Status: Abnormal   Collection Time: 04/01/14  1:00 PM  Result Value Ref Range Status   C difficile by pcr POSITIVE (A) NEGATIVE Final    Comment: CRITICAL RESULT CALLED TO, READ BACK BY AND VERIFIED WITH: SEIGLA,J. AT 1703 ON 04/01/2014 BY BAUGHAM,M.      Basic Metabolic Panel:  Recent Labs  04/01/14 0522 04/02/14 0543  NA 139 139  K 3.3* 4.0  CL 103 104  CO2 20 18*  GLUCOSE 96 112*  BUN 4* 9  CREATININE 0.85 1.09  CALCIUM 7.8* 8.2*   Liver Function Tests: No results for input(s): AST, ALT, ALKPHOS, BILITOT, PROT, ALBUMIN in the last 72 hours.   CBC:  Recent Labs  04/01/14 0522 04/02/14 0543  WBC 9.8 10.5  HGB 9.1* 9.6*  HCT 28.6* 30.7*  MCV 82.7 82.5  PLT 513* 512*    No results found.    Medications:   Impression:  Active Problems:   Hypertension   Thyroid disease   Diverticulitis of colon   Atrial fibrillation   Weakness generalized  Protein-calorie malnutrition, severe   CHF (congestive heart failure)   Diverticulosis   Gastroenteritis   Diverticulitis   Diverticulitis of colon with perforation     Plan: Advance diet dysphagia 3 , add Celexa 20 by mouth daily for anxiety and albuterol nebulizer every 6 hours when necessary  Consultants: GI and surgery   Procedures   Antibiotics: Cipro and Flagyl and by mouth vancomycin                  Code Status:   Family Communication:    Disposition Plan add Celexa 20 by mouth daily for anxiety add albuterol nebulizer every 6 hours when necessary advance diet dysphagia 3  Time spent: 30 minutes   LOS: 5 days   Jerusalem Brownstein M   04/02/2014, 7:54 AM

## 2014-04-02 NOTE — Progress Notes (Signed)
  Subjective:  Patient complains of shortness of breath. This symptom started during the night. She is able to breathe better when she is sitting upright. She denies cough or shortness of breath. She also denies wheezing. She is not having any more abdominal pain. She had 3 stools in the last 24 hours. Stool less watery.   Objective: Blood pressure 122/74, pulse 155, temperature 98 F (36.7 C), temperature source Oral, resp. rate 20, height 5\' 7"  (1.702 m), weight 136 lb (61.689 kg), SpO2 92 %. Patient is sitting at edge of the bed and does not appear to be in acute distress. JVD is elevated. Cardiac exam with right rhythm normal S1 and S2. No murmur or gallop noted. Auscultation of lungs revealed diminished intensity of breath sounds bilaterally. Abdomen. Umbilical hernia feels softer today it is mildly tender. Bowel sounds are normal. No LE edema or clubbing noted.  Labs/studies Results:   Recent Labs  03/31/14 0518 04/01/14 0522 04/02/14 0543  WBC 7.1 9.8 10.5  HGB 9.2* 9.1* 9.6*  HCT 28.3* 28.6* 30.7*  PLT 485* 513* 512*    BMET   Recent Labs  03/31/14 0518 04/01/14 0522 04/02/14 0543  NA 140 139 139  K 3.1* 3.3* 4.0  CL 104 103 104  CO2 22 20 18*  GLUCOSE 90 96 112*  BUN 5* 4* 9  CREATININE 0.82 0.85 1.09  CALCIUM 7.8* 7.8* 8.2*     Assessment:  #1. Sigmoid diverticulitis. Significant symptomatic improvement in the last 48 hours. #2. C. difficile colitis. Stool consistency is improving and she only had 3 bowel movements in last 24 hours. #3.  Anemia. No evidence of overt GI bleed #4.  Shortness of breath most likely due to fluid overload. She may have bilateral pleural effusions based on exam.  Recommendations;  Furosemide 20 mg IV 1. Advance diet to heart healthy diet. May be able to switch her to oral Cipro and metronidazole in the next 24-48 hours.

## 2014-04-02 NOTE — Progress Notes (Signed)
Attempted to place PICC line by request of Dr. Cindie Laroche and was unable to get PICC to thread.  BP 90/34 and was unable to get vein to open enough for insertion.

## 2014-04-02 NOTE — Progress Notes (Signed)
eLink Physician-Brief Progress Note Patient Name: Danielle [name] DOB: 11/19/1930 MRN: [id number]   Date of Service  04/02/2014  HPI/Events of Note  80 yo woman, s/p arrest from presumed cardiogenic pulmonary edema. Now intubated in ICU with profound metabolic acidosis, presumed lactic acidosis, acute renal failure and anuria.   eICU Interventions  - will add Na Bicarb gtt - adjust vent to maximize Ve - prognosis here is very poor, doubt she is a candidate for CVVHD or would be stable for transport     Intervention Category Major Interventions: Acid-Base disturbance - evaluation and management;Respiratory failure - evaluation and management  Shykeria Sakamoto S. 04/02/2014, 4:02 PM

## 2014-04-02 NOTE — Progress Notes (Addendum)
Notified Dr. Cindie Laroche patient not improving order given to give dose lopressor and transfer patient to ICU. Lopressor not given BP 82/64 pulse 112.

## 2014-04-02 NOTE — Procedures (Signed)
Central Venous Catheter Insertion Procedure Note IMBERLY TROXLER [id number] Mar 21, 1931  Procedure: Insertion of Central Venous Catheter Indications: Assessment of intravascular volume, Drug and/or fluid administration and Frequent blood sampling  Procedure Details Consent: Risks of procedure as well as the alternatives and risks of each were explained to the (patient/caregiver).  Consent for procedure obtained. Time Out: Verified patient identification, verified procedure, site/side was marked, verified correct patient position, special equipment/implants available, medications/allergies/relevent history reviewed, required imaging and test results available.  Performed  Maximum sterile technique was used including antiseptics, cap, gloves, gown, hand hygiene, mask and sheet. Skin prep: Chlorhexidine; local anesthetic administered A antimicrobial bonded/coated triple lumen catheter was placed in the right femoral vein due to multiple attempts and heparin drip, no other available access using the Seldinger technique.  Evaluation Blood flow good Complications: No apparent complications Patient did tolerate procedure well.  Shivan Hodes A 04/02/2014, 6:25 PM

## 2014-04-02 NOTE — Progress Notes (Signed)
Patient had asystolic event, he was in ventricular fibrillation no pulse for 4-7 minutes 10 PM this appointment 10 AM this morning patient had a new with a rapid ventricular response prior to the treated with IV Lopressor 5 mg slow push currently in ICU with significant hypotension an uric renal failure on maximal dose Levothroid dialed up over 2 hours after administration of 2 L of IV fluids there is no urine output with Lasix 40 mg IV 2 interesting her initial troponins are -11 to severe metabolic acidosis with pH of 6.9 CO2 of 900% FiO2 consideration given to pulmonary embolism as well as myocardial infarction initially patient has not been stable enough for CT angiogram d-dimer was elevated at 3.6 however she has been fully anticoagulated since admission for chronic atrial fibrillation her BNP is 25,000 which may be acute on chronic heart systolic failure her ejection fraction was noted to be 40-45% with mild mitral regurgitation in February 2015 patient currently intubated and sedated consideration given to dialysis however do not feel she is stable hemodynamically stable enough for transfer or for CT angiogram downstairs at present we'll reevaluate this if patient's survives. A long discussion with the family about options and feel it is imperative to stabilize her if possible prior to risk of transfer with minimal benefit from dialysis as her pressure is too low for dialysis will continue serial cardiac enzymes serial Bement CBC in addition to IV sodium bicarbonate infusing at present patient understands she has a less than 50% chance of survival till a.[name]. and they seem to accept her remaining here at [facility]. Danielle [name] YJW:[id number] DOB: 1930-05-11 DOA: 04/04/2014 PCP: Danielle Curet, MD             Physical Exam: Blood pressure 98/58, pulse 104, temperature 98 F (36.7 C), temperature source Oral, resp. rate 20, height 5\' 7"  (1.702 [name]), weight 136 lb (61.689 kg), SpO2 90 %.  patient appears mottled showed diminished breath sounds in bases minimal crackles at base good air entry bilaterally scattered rhonchi bilaterally heart is irregularly regular no S3 auscultated but he feels rubs abdomen soft nontender bowel sounds normal active   Investigations:  Recent Results (from the past 240 hour(s))  Clostridium Difficile by PCR     Status: Abnormal   Collection Time: 03/14/2014 10:22 PM  Result Value Ref Range Status   C difficile by pcr POSITIVE (A) NEGATIVE Final    Comment: CRITICAL RESULT CALLED TO, READ BACK BY AND VERIFIED WITH:  Danielle [name],S @ 0370 ON 04/01/2014 BY WOODIE,J   Clostridium Difficile by PCR     Status: Abnormal   Collection Time: 04/01/14  1:00 PM  Result Value Ref Range Status   C difficile by pcr POSITIVE (A) NEGATIVE Final    Comment: CRITICAL RESULT CALLED TO, READ BACK BY AND VERIFIED WITH: SEIGLA,J. AT 1703 ON 04/01/2014 BY BAUGHAM,[name].      Basic Metabolic Panel:  Recent Labs  04/02/14 0543 04/02/14 1145 04/02/14 1617  NA 139  --  141  K 4.0  --  5.5*  CL 104  --  105  CO2 18*  --  9*  GLUCOSE 112*  --  67*  BUN 9  --  10  CREATININE 1.09  --  1.30*  CALCIUM 8.2*  --  7.3*  MG  --  1.6  --   PHOS  --  5.0*  --    Liver Function Tests: No results for input(s): AST, ALT, ALKPHOS, BILITOT, PROT, ALBUMIN in  the last 72 hours.   CBC:  Recent Labs  04/01/14 0522 04/02/14 0543  WBC 9.8 10.5  HGB 9.1* 9.6*  HCT 28.6* 30.7*  MCV 82.7 82.5  PLT 513* 512*    Dg Chest 1 View  04/02/2014   CLINICAL DATA:  Acute worsening shortness of breath  EXAM: CHEST - 1 VIEW  COMPARISON:  03/26/2014, 02/25/2014  FINDINGS: Cardiomegaly with worsening vascular and interstitial prominence compatible with edema. Effusions are evident for with small effusions bilaterally. Pattern compatible with CHF. There is associated bibasilar atelectasis/ collapse. No pneumothorax. Trachea is midline. Atherosclerosis of the aorta.  IMPRESSION: Cardiomegaly  with new developing interstitial edema and small effusions compatible with CHF  Bibasilar atelectasis.   Electronically Signed   By: Daryll Brod [name].D.   On: 04/02/2014 11:02   Dg Chest 1v Repeat Same Day  04/02/2014   CLINICAL DATA:  Intubation.  Post intubation radiographs.  EXAM: CHEST - 1 VIEW SAME DAY  COMPARISON:  04/02/2014 at 1000 hr.  FINDINGS: Support apparatus: Interval intubation with the endotracheal tube tip 64 mm from the carina. Monitoring leads project over the chest. Defibrillator pads overlie the chest.  Cardiomediastinal Silhouette: Cardiomegaly. The cardiopericardial silhouette is better seen than on the prior exam due to improved aeration.  Lungs: Improved lung volumes with worse moderate to severe CHF with bilateral pulmonary edema. No pneumothorax.  Effusions:  Probable small LEFT  Other:  None.  IMPRESSION: 1. Interval intubation. 2. Improved lung volumes with moderate to severe CHF which appears worse in the interval since the prior exam. Allowing for changes in pulmonary volumes, of the amount of pulmonary edema is greater.   Electronically Signed   By: Dereck Ligas [name].D.   On: 04/02/2014 12:44      Medications:   Impression: Cardiogenic shock due to asystolic event complicated with ventricular fibrillation resuscitation and cardioversion currently intubated with a renal respiratory failure with concomitant and uric renal failure presumably due to acute tubular necrosis from poor renal perfusion during resuscitative attempt chronic systolic heart failure with ejection fraction of 40-45% with mild MR prognosis is dismal Active Problems:   Hypertension   Thyroid disease   Diverticulitis of colon   Atrial fibrillation   Weakness generalized   Protein-calorie malnutrition, severe   CHF (congestive heart failure)   Diverticulosis   Gastroenteritis   Diverticulitis   Diverticulitis of colon with perforation     Plan: After discussion with family I do not feel she is  stable for transfer hemodynamically continue to monitor serial cardiac enzymes and renal function and renal output I&O's continue to push IV Lasix and continue IV bicarbonate monitoring acidosis resumed due to poor tissue perfusion   Consultants: [facility] consult nephrology, pulmonology as well as cardiology and surgery coming in for central line is PICC line was attempted without success due to low diastolic pressure of 44   Procedures Central line   Antibiotics: IV Flagyl IV Cipro by mouth vancomycin                   Code Status: No CPR and no cardioversion   Family Communication:  Long talk with her husband and niece and other family members present about all of the above Disposition Plan nephrology and pulmonology and cardiology consult in a.[name]. 2-D echo in a.[name]. serial enzymes and serial blood gases and Bement as well as CBC  Time spent: 60 minutes   LOS: 5 days   Danielle [name]   04/02/2014, 5:33  PM

## 2014-04-02 NOTE — Progress Notes (Signed)
Rapid response called patient states she feels like she is about to pass out.

## 2014-04-02 NOTE — Progress Notes (Deleted)
Informed Dr. Cindie Laroche rapid response called patient not responding to treatment. Respiration 30 patient still states she is short of breath. Lasix 40mg  given new order for D-Dimer. Patient remain anxious and restless. Foley catheter inserted scant amount urine noted. BP 84/62. Order given to transfer to ICU.

## 2014-04-02 NOTE — Progress Notes (Signed)
Patient keeps removing oxygen tubing. OOB to bedside commode will not use bedpan. Assist patient back to bed spouse at bedside. Patient anxious.

## 2014-04-02 NOTE — Progress Notes (Signed)
eLink Physician-Brief Progress Note Patient Name: Danielle [name] DOB: [birth year] MRN: [id number]   Date of Service  04/02/2014  HPI/Events of Note  Notified by RN Sherrie that pt's husband has indicated to her that he and family would not want pt to receive CPR in event of a decompensation. This builds on discussions with family had by Dr Emilee Hero and Cindie Laroche. I agree that is she were to code that she would not survive CPR. I will place NCB orders in the chart and we will continue to treat her with current interventions.   eICU Interventions       Intervention Category Major Interventions: Other:  BYRUM,ROBERT S. 04/02/2014, 11:31 PM

## 2014-04-03 ENCOUNTER — Inpatient Hospital Stay (HOSPITAL_COMMUNITY): Payer: Medicare Other

## 2014-04-03 ENCOUNTER — Other Ambulatory Visit (HOSPITAL_COMMUNITY): Payer: Self-pay | Admitting: Cardiology

## 2014-04-03 ENCOUNTER — Encounter (HOSPITAL_COMMUNITY): Payer: Self-pay | Admitting: Cardiology

## 2014-04-03 DIAGNOSIS — I480 Paroxysmal atrial fibrillation: Secondary | ICD-10-CM

## 2014-04-03 DIAGNOSIS — R6521 Severe sepsis with septic shock: Secondary | ICD-10-CM

## 2014-04-03 DIAGNOSIS — I248 Other forms of acute ischemic heart disease: Secondary | ICD-10-CM

## 2014-04-03 DIAGNOSIS — I059 Rheumatic mitral valve disease, unspecified: Secondary | ICD-10-CM

## 2014-04-03 DIAGNOSIS — I469 Cardiac arrest, cause unspecified: Secondary | ICD-10-CM

## 2014-04-03 DIAGNOSIS — A419 Sepsis, unspecified organism: Secondary | ICD-10-CM

## 2014-04-03 LAB — BLOOD GAS, ARTERIAL
Acid-base deficit: 17.7 mmol/L — ABNORMAL HIGH (ref 0.0–2.0)
BICARBONATE: 9.2 meq/L — AB (ref 20.0–24.0)
Drawn by: 22223
FIO2: 100 %
O2 Saturation: 94 %
PCO2 ART: 26.1 mmHg — AB (ref 35.0–45.0)
PEEP: 3 cmH2O
PH ART: 7.172 — AB (ref 7.350–7.450)
Patient temperature: 37
RATE: 24 resp/min
TCO2: 9 mmol/L (ref 0–100)
VT: 550 mL
pO2, Arterial: 89.4 mmHg (ref 80.0–100.0)

## 2014-04-03 LAB — BASIC METABOLIC PANEL
BUN: 12 mg/dL (ref 6–23)
CO2: 9 meq/L — AB (ref 19–32)
CREATININE: 1.72 mg/dL — AB (ref 0.50–1.10)
Calcium: 6.9 mg/dL — ABNORMAL LOW (ref 8.4–10.5)
Chloride: 99 mEq/L (ref 96–112)
GFR calc Af Amer: 31 mL/min — ABNORMAL LOW (ref >90)
GFR calc non Af Amer: 27 mL/min — ABNORMAL LOW (ref >90)
GLUCOSE: 193 mg/dL — AB (ref 70–99)
Potassium: 5.2 mEq/L (ref 3.7–5.3)
Sodium: 140 mEq/L (ref 137–147)

## 2014-04-03 LAB — CBC
HEMATOCRIT: 34.9 % — AB (ref 36.0–46.0)
Hemoglobin: 10.4 g/dL — ABNORMAL LOW (ref 12.0–15.0)
MCH: 26.3 pg (ref 26.0–34.0)
MCHC: 29.8 g/dL — ABNORMAL LOW (ref 30.0–36.0)
MCV: 88.1 fL (ref 78.0–100.0)
Platelets: 140 10*3/uL — ABNORMAL LOW (ref 150–400)
RBC: 3.96 MIL/uL (ref 3.87–5.11)
RDW: 17 % — ABNORMAL HIGH (ref 11.5–15.5)
WBC: 47.8 10*3/uL — ABNORMAL HIGH (ref 4.0–10.5)

## 2014-04-03 LAB — CBC WITH DIFFERENTIAL/PLATELET
BASOS ABS: 0 10*3/uL (ref 0.0–0.1)
BASOS PCT: 0 % (ref 0–1)
EOS ABS: 0 10*3/uL (ref 0.0–0.7)
EOS PCT: 0 % (ref 0–5)
HCT: 32.5 % — ABNORMAL LOW (ref 36.0–46.0)
Hemoglobin: 10.3 g/dL — ABNORMAL LOW (ref 12.0–15.0)
LYMPHS ABS: 0.6 10*3/uL — AB (ref 0.7–4.0)
Lymphocytes Relative: 1 % — ABNORMAL LOW (ref 12–46)
MCH: 26.9 pg (ref 26.0–34.0)
MCHC: 31.7 g/dL (ref 30.0–36.0)
MCV: 84.9 fL (ref 78.0–100.0)
Monocytes Absolute: 1 10*3/uL (ref 0.1–1.0)
Monocytes Relative: 2 % — ABNORMAL LOW (ref 3–12)
NEUTROS PCT: 96 % — AB (ref 43–77)
Neutro Abs: 42.2 10*3/uL — ABNORMAL HIGH (ref 1.7–7.7)
PLATELETS: 119 10*3/uL — AB (ref 150–400)
RBC: 3.83 MIL/uL — AB (ref 3.87–5.11)
RDW: 17 % — ABNORMAL HIGH (ref 11.5–15.5)
WBC: 43.9 10*3/uL — ABNORMAL HIGH (ref 4.0–10.5)

## 2014-04-03 LAB — PROCALCITONIN: Procalcitonin: 7.08 ng/mL

## 2014-04-03 LAB — HEPARIN LEVEL (UNFRACTIONATED)
HEPARIN UNFRACTIONATED: 0.19 [IU]/mL — AB (ref 0.30–0.70)
Heparin Unfractionated: <0.1 IU/mL — ABNORMAL LOW (ref 0.30–0.70)
Heparin Unfractionated: 0.18 IU/mL — ABNORMAL LOW (ref 0.30–0.70)

## 2014-04-03 LAB — TROPONIN I: Troponin I: 0.72 ng/mL (ref <0.30)

## 2014-04-03 MED ORDER — FENTANYL CITRATE 0.05 MG/ML IJ SOLN
INTRAMUSCULAR | Status: AC
  Filled 2014-04-03: qty 50

## 2014-04-03 MED ORDER — NOREPINEPHRINE BITARTRATE 1 MG/ML IV SOLN
INTRAVENOUS | Status: AC
Start: 2014-04-03 — End: 2014-04-03
  Filled 2014-04-03: qty 16

## 2014-04-03 MED ORDER — SODIUM CHLORIDE 4 MEQ/ML IV SOLN
INTRAVENOUS | Status: DC
  Administered 2014-04-03 – 2014-04-05 (×5): via INTRAVENOUS
  Filled 2014-04-03 (×10): qty 9.7

## 2014-04-03 MED ORDER — HYDROCORTISONE NA SUCCINATE PF 100 MG IJ SOLR
100.0000 mg | Freq: Four times a day (QID) | INTRAMUSCULAR | Status: DC
  Administered 2014-04-03 – 2014-04-05 (×9): 100 mg via INTRAVENOUS
  Filled 2014-04-03 (×9): qty 2

## 2014-04-03 MED ORDER — FENTANYL CITRATE 0.05 MG/ML IJ SOLN
50.0000 ug | Freq: Once | INTRAMUSCULAR | Status: AC
  Administered 2014-04-03: 50 ug via INTRAVENOUS
  Filled 2014-04-03: qty 2

## 2014-04-03 MED ORDER — MIDAZOLAM HCL 2 MG/2ML IJ SOLN: 1.0000 mg | INTRAMUSCULAR | Status: DC | PRN

## 2014-04-03 MED ORDER — PANTOPRAZOLE SODIUM 40 MG IV SOLR
40.0000 mg | INTRAVENOUS | Status: DC
  Administered 2014-04-03 – 2014-04-04 (×2): 40 mg via INTRAVENOUS
  Filled 2014-04-03 (×2): qty 40

## 2014-04-03 MED ORDER — MIDAZOLAM HCL 2 MG/2ML IJ SOLN
1.0000 mg | INTRAMUSCULAR | Status: DC | PRN
Start: 2014-04-03 — End: 2014-04-05
  Administered 2014-04-04: 1 mg via INTRAVENOUS
  Filled 2014-04-03: qty 2

## 2014-04-03 MED ORDER — VANCOMYCIN HCL IN DEXTROSE 1-5 GM/200ML-% IV SOLN
1000.0000 mg | Freq: Once | INTRAVENOUS | Status: AC
  Administered 2014-04-03: 1000 mg via INTRAVENOUS
  Filled 2014-04-03: qty 200

## 2014-04-03 MED ORDER — HEPARIN BOLUS VIA INFUSION
2000.0000 [IU] | Freq: Once | INTRAVENOUS | Status: AC
  Administered 2014-04-03: 2000 [IU] via INTRAVENOUS
  Filled 2014-04-03: qty 2000

## 2014-04-03 MED ORDER — DOPAMINE-DEXTROSE 3.2-5 MG/ML-% IV SOLN
0.0000 ug/kg/min | INTRAVENOUS | Status: DC
  Administered 2014-04-03: 20 ug/kg/min via INTRAVENOUS
  Filled 2014-04-03 (×2): qty 250

## 2014-04-03 MED ORDER — FENTANYL BOLUS VIA INFUSION
25.0000 ug | INTRAVENOUS | Status: DC | PRN
  Administered 2014-04-03 (×3): 25 ug via INTRAVENOUS
  Filled 2014-04-03: qty 25

## 2014-04-03 MED ORDER — SODIUM CHLORIDE 0.9 % IJ SOLN
10.0000 mL | Freq: Two times a day (BID) | INTRAMUSCULAR | Status: DC
  Administered 2014-04-03: 20 mL
  Administered 2014-04-04: 10 mL

## 2014-04-03 MED ORDER — SODIUM CHLORIDE 0.9 % IV SOLN
25.0000 ug/h | INTRAVENOUS | Status: DC
  Administered 2014-04-03: 25 ug/h via INTRAVENOUS
  Administered 2014-04-03 – 2014-04-04 (×2): 100 ug/h via INTRAVENOUS
  Administered 2014-04-05: 125 ug/h via INTRAVENOUS
  Filled 2014-04-03 (×3): qty 50

## 2014-04-03 MED ORDER — VANCOMYCIN HCL IN DEXTROSE 750-5 MG/150ML-% IV SOLN
750.0000 mg | INTRAVENOUS | Status: DC
  Filled 2014-04-03 (×2): qty 150

## 2014-04-03 MED ORDER — SODIUM CHLORIDE 0.9 % IJ SOLN: 10.0000 mL | INTRAMUSCULAR | Status: DC | PRN

## 2014-04-03 MED ORDER — FUROSEMIDE 10 MG/ML IJ SOLN
80.0000 mg | Freq: Two times a day (BID) | INTRAMUSCULAR | Status: DC
  Administered 2014-04-03 – 2014-04-04 (×2): 80 mg via INTRAVENOUS
  Filled 2014-04-03 (×2): qty 8

## 2014-04-03 NOTE — Progress Notes (Signed)
Events of past 24 hours noted. Patient remains intubated and on pressor therapy. Nephrology, pulmonary and cardiology input noted.  Vital signs in last 24 hours: Temp:  [96.4 F (35.8 C)-96.8 F (36 C)] 96.6 F (35.9 C) (11/27 0400) Pulse Rate:  [29-108] 99 (11/27 1015) Resp:  [15-32] 24 (11/27 1015) BP: (69-143)/(28-128) 120/60 mmHg (11/27 1015) SpO2:  [56 %-100 %] 94 % (11/27 1015) FiO2 (%):  [100 %] 100 % (11/27 0700) Weight:  [151 lb 14.4 oz (68.9 kg)] 151 lb 14.4 oz (68.9 kg) (11/27 0500) Last BM Date: 04/03/14 General:   Intubated/obtunded. Husband at the bedside. Abdomen:  Minimally distended. Obese. Bowel sounds are absent. The abdomen is soft but there is diffuse voluntary guarding and some rebound tenderness. Easily reducible umbilical hernia present. Extremities:  Without clubbing or edema.    Intake/Output from previous day: 11/26 0701 - 11/27 0700 In: 7012.7 [I.V.:6012.7; IV Piggyback:1000] Out: 350 [Urine:350] Intake/Output this shift: Total I/O In: 301.1 [I.V.:301.1] Out: 50 [Urine:50]  Lab Results:  Recent Labs  04/01/14 0522 04/02/14 0543 04/03/14 0429  WBC 9.8 10.5 47.8*  HGB 9.1* 9.6* 10.4*  HCT 28.6* 30.7* 34.9*  PLT 513* 512* 140*   BMET  Recent Labs  04/02/14 1617 04/02/14 2059 04/03/14 0429  NA 141 138 140  K 5.5* 5.9* 5.2  CL 105 102 99  CO2 9* 6* 9*  GLUCOSE 67* 77 193*  BUN 10 11 12   CREATININE 1.30* 1.50* 1.72*  CALCIUM 7.3* 7.1* 6.9*    Impression/ Recommendations:   Unfortunate 80 year old lady admitted to hospital critically ill with diverticulitis with perforation / sepsis and superimposed Clostridium difficile infection. Course further complicated with cardiopulmonary arrest, renal failure and acidosis. I have discussed the case with Dr. Arnoldo Morale. She is certainly not a surgical candidate. We mutually agreed as far as intra-abdominal process is concerned, she is on an aggressive antibiotic regimen as well as receiving other  supportive measures. No need for repeat cross sectional imaging as it will not change management at this time. Discussed at length with patient's husband. Agree with continuing supportive measures. Overall prognosis at this time appears poor.

## 2014-04-03 NOTE — Care Management Utilization Note (Signed)
UR review complete.  

## 2014-04-03 NOTE — Progress Notes (Addendum)
CRITICAL VALUE ALERT  Critical value received:  Trop 0.60  Date of notification: 04/02/2014  Time of notification:  2030  Critical value read back:Yes.    Nurse who received alert:  S. Tera Mater, RN  MD notified (1st page): Dr Everette Rank  Time of first page:  2031  MD notified (2nd page):  Time of second page:  Responding MD:  Dr Everette Rank  Time MD responded: 2035  Given critical lab results and updated on patient condition; 12 lead EKG orders received at this time, on the way into the hospital to assess the patient.

## 2014-04-03 NOTE — Progress Notes (Signed)
Spoke with patient's husband Danielle [name] about patient's code status. According to the husband after the conversation he and the family had with Dr Everette Rank he no longer wished for the patient to have CPR or defibrillation. He does want to continue the current treatment th patient is receiving for intubation and medications. Mr Lacasse stated that if it is time for her (Danielle [name]) to go then to let her go without trying to put her through any more treatments. Dr Everette Rank and Dr Lamonte Sakai both were notified of the family wishes.

## 2014-04-03 NOTE — Progress Notes (Signed)
eLink Physician-Brief Progress Note Patient Name: MARVEL SAPP DOB: 09-29-1930 MRN: [id number]   Date of Service  04/03/2014  HPI/Events of Note    eICU Interventions  Sedation started per protocol     Intervention Category Major Interventions: Delirium, psychosis, severe agitation - evaluation and management Intermediate Interventions: Abdominal pain - evaluation and management  Mekai Wilkinson S. 04/03/2014, 2:09 AM

## 2014-04-03 NOTE — Progress Notes (Signed)
Dr Everette Rank in the room talking with the family about the critical condition that the patient is currently in, consultation was completed with E-Link physician on possible transfer, decisions made that patient is not currently stable enough for transfer and that patient is getting the critical care that she needs. No new orders received at this time. Family remains in the room at the bedside. Will continue to monitor and document any changes in patient condition.

## 2014-04-03 NOTE — Progress Notes (Signed)
eventfull night, characterized with asystolic and ventricular fibrillation cardiac arrest no pulse for 6-8 minutes continued hypotension requiring maximal pressor agents. Presumed acute tubular necrosis due to hypoperfusion from cardiac event anuric renal failure now producing 350 mL of urine total ejection fraction by echo to 15 was 4045% chronic atrial fibrillation on anticoagulation status post colitis with C. difficile recurrently intravascular volume overload by elevated BNP and radiographically he shouldn't alert this morning intubated on by mouth vancomycin for C. difficile on IV Cipro and Flagyl for colitis with sealed perforation Danielle [name] IHK:[id number] DOB: [birth year] DOA: 03/17/2014 PCP: Danielle Curet, MD             Physical Exam: Blood pressure 100/59, pulse 104, temperature 96.6 F (35.9 C), temperature source Axillary, resp. rate 22, height 5\' 7"  (1.702 [name]), weight 151 lb 14.4 oz (68.9 kg), SpO2 94 %. alert this morning on Levothroid 40 mics lungs show diminished breath sounds in the bases minimal bibasilar crepitation adequate air entry bilaterally scattered rhonchi bilaterally heart irregular regular no S3 auscultated abdomen soft nontender bowel sounds normoactive peristaltic rushes   Investigations:  Recent Results (from the past 240 hour(s))  Clostridium Difficile by PCR     Status: Abnormal   Collection Time: 04/04/2014 10:22 PM  Result Value Ref Range Status   C difficile by pcr POSITIVE (A) NEGATIVE Final    Comment: CRITICAL RESULT CALLED TO, READ BACK BY AND VERIFIED WITH:  Danielle [name] @ 2329 ON 04/03/2014 BY Danielle [name]   Clostridium Difficile by PCR     Status: Abnormal   Collection Time: 04/01/14  1:00 PM  Result Value Ref Range Status   C difficile by pcr POSITIVE (A) NEGATIVE Final    Comment: CRITICAL RESULT CALLED TO, READ BACK BY AND VERIFIED WITH: SEIGLA,J. AT 1703 ON 04/01/2014 BY BAUGHAM,[name].      Basic Metabolic Panel:  Recent Labs  04/02/14 1145  04/02/14 2059 04/03/14 0429  NA  --   < > 138 140  K  --   < > 5.9* 5.2  CL  --   < > 102 99  CO2  --   < > 6* 9*  GLUCOSE  --   < > 77 193*  BUN  --   < > 11 12  CREATININE  --   < > 1.50* 1.72*  CALCIUM  --   < > 7.1* 6.9*  MG 1.6  --   --   --   PHOS 5.0*  --   --   --   < > = values in this interval not displayed. Liver Function Tests: No results for input(s): AST, ALT, ALKPHOS, BILITOT, PROT, ALBUMIN in the last 72 hours.   CBC:  Recent Labs  04/02/14 0543 04/03/14 0429  WBC 10.5 47.8*  HGB 9.6* 10.4*  HCT 30.7* 34.9*  MCV 82.5 88.1  PLT 512* 140*    Dg Chest 1 View  04/02/2014   CLINICAL DATA:  Acute worsening shortness of breath  EXAM: CHEST - 1 VIEW  COMPARISON:  04/04/2014, 02/25/2014  FINDINGS: Cardiomegaly with worsening vascular and interstitial prominence compatible with edema. Effusions are evident for with small effusions bilaterally. Pattern compatible with CHF. There is associated bibasilar atelectasis/ collapse. No pneumothorax. Trachea is midline. Atherosclerosis of the aorta.  IMPRESSION: Cardiomegaly with new developing interstitial edema and small effusions compatible with CHF  Bibasilar atelectasis.   Electronically Signed   By: Daryll Brod [name].D.   On: 04/02/2014 11:02   Dg Chest  1v Repeat Same Day  04/02/2014   CLINICAL DATA:  Intubation.  Post intubation radiographs.  EXAM: CHEST - 1 VIEW SAME DAY  COMPARISON:  04/02/2014 at 1000 hr.  FINDINGS: Support apparatus: Interval intubation with the endotracheal tube tip 64 mm from the carina. Monitoring leads project over the chest. Defibrillator pads overlie the chest.  Cardiomediastinal Silhouette: Cardiomegaly. The cardiopericardial silhouette is better seen than on the prior exam due to improved aeration.  Lungs: Improved lung volumes with worse moderate to severe CHF with bilateral pulmonary edema. No pneumothorax.  Effusions:  Probable small LEFT  Other:  None.  IMPRESSION: 1. Interval  intubation. 2. Improved lung volumes with moderate to severe CHF which appears worse in the interval since the prior exam. Allowing for changes in pulmonary volumes, of the amount of pulmonary edema is greater.   Electronically Signed   By: Dereck Ligas [name].D.   On: 04/02/2014 12:44   Dg Chest Port 1 View  04/03/2014   CLINICAL DATA:  Intubated.  Central line placement.  EXAM: PORTABLE CHEST - 1 VIEW  COMPARISON:  Yesterday.  FINDINGS: The endotracheal tube is in satisfactory position. No interval central venous catheter is seen. No pneumothorax. Stable enlarged cardiac silhouette and diffuse prominence of the pulmonary vasculature and interstitial markings. No significant change bilateral airspace opacity. Small right pleural effusion. Unremarkable bones.  IMPRESSION: 1. No central venous catheter is seen. 2. Stable changes of congestive heart failure.   Electronically Signed   By: Enrique Sack [name].D.   On: 04/03/2014 07:24      Medications: Consideration given to septicemia however pro calcitonin within normal limits. Rule out myocardial infarction as cause of asystole Impression: An uric renal failure resolving with 350 mL of urine output Active Problems:   Hypertension   Thyroid disease   Diverticulitis of colon   Atrial fibrillation   Weakness generalized   Protein-calorie malnutrition, severe   CHF (congestive heart failure)   Diverticulosis   Gastroenteritis   Diverticulitis   Diverticulitis of colon with perforation     Plan: Pulmonology, nephrology and cardiology consult this morning 2-D echo to assess ventricular function currently in any new wall motion abnormalities serial cardiac enzymes serial renal functions continue bicarbonate infusion for acidosis  Consultants: Nephrology cardiology pulmonology   Procedures Central line placed last evening   Antibiotics: IV Cipro and by mouth vancomycin IV Flagyl                  Code Status: No cardioversion or chest  compression as discussed with family  Family Communication:  Spoke with husband at bedside this morning  Disposition Plan wean levo fed if pressures permit  Time spent: 45 minutes   LOS: 6 days   Danielle [name]   04/03/2014, 7:45 AM

## 2014-04-03 NOTE — Consult Note (Signed)
Reason for Consult: Acute kidney injury and metabolic acidosis  Referring Physician: Dr. [name] is an 79 y.o. female.  HPI: She is a patient was history of diabetes, hypertension, arterial fibrillation presently came to the hospital because of nausea, vomiting and abdominal pain for about 3 to 4 days' duration. Presently patient with hypotension, status post cardiac arrest and intubated. According to her chart patient does not seem to have any problem with his kidneys until this admission. Most of the information is gathered from her chart. Since patient is intubated and unable to get additional history. Past Medical History  Diagnosis Date  . Osteoarthritis   . Macular degeneration   . Essential hypertension   . Hypothyroidism   . Thyroid cancer   . GI bleed   . Atrial fibrillation   . C. difficile colitis   . Diverticulitis   . Shingles   . Lupus   . LBBB (left bundle branch block)   . Secondary cardiomyopathy     LVEF 40-45% February 2015    Past Surgical History  Procedure Laterality Date  . Thyroidectomy    . Colonoscopy  03/17/2011    Procedure: COLONOSCOPY;  Surgeon: Danielle Houston, MD;  Location: [facility];  Service: Endoscopy;  Laterality: N/A;  . Cyst removal neck      Family History  Problem Relation Age of Onset  . Tuberculosis Mother   . Thrombosis Father   . Deep vein thrombosis Sister   . Deep vein thrombosis Father     Social History:  reports that she has never smoked. She has never used smokeless tobacco. She reports that she does not drink alcohol or use illicit drugs.  Allergies:  Allergies  Allergen Reactions  . Thyroid Hormones Hives and Rash    proparathyroid thyroid medication    Medications: I have reviewed the patient's current medications.  Results for orders placed or performed during the hospital encounter of 04/01/2014 (from the past 48 hour(s))  Clostridium Difficile by PCR     Status: Abnormal   Collection  Time: 04/01/14  1:00 PM  Result Value Ref Range   C difficile by pcr POSITIVE (A) NEGATIVE    Comment: CRITICAL RESULT CALLED TO, READ BACK BY AND VERIFIED WITH: SEIGLA,J. AT 1703 ON 04/01/2014 BY BAUGHAM,M.   Vitamin B12     Status: Abnormal   Collection Time: 04/01/14  1:51 PM  Result Value Ref Range   Vitamin B-12 1112 (H) 211 - 911 pg/mL    Comment: Performed at [facility]  Folate     Status: None   Collection Time: 04/01/14  1:51 PM  Result Value Ref Range   Folate >20.0 ng/mL    Comment: (NOTE) Reference Ranges        Deficient:       0.4 - 3.3 ng/mL        Indeterminate:   3.4 - 5.4 ng/mL        Normal:              > 5.4 ng/mL Performed at [facility]   Iron and TIBC     Status: Abnormal   Collection Time: 04/01/14  1:51 PM  Result Value Ref Range   Iron 14 (L) 42 - 135 ug/dL   TIBC 160 (L) 250 - 470 ug/dL   Saturation Ratios 9 (L) 20 - 55 %   UIBC 146 125 - 400 ug/dL    Comment: Performed at  [facility]  Ferritin     Status: None   Collection Time: 04/01/14  1:51 PM  Result Value Ref Range   Ferritin 240 10 - 291 ng/mL    Comment: Performed at [facility]     Status: Abnormal   Collection Time: 04/01/14  1:51 PM  Result Value Ref Range   Retic Ct Pct 1.0 0.4 - 3.1 %   RBC. 3.64 (L) 3.87 - 5.11 MIL/uL   Retic Count, Manual 36.4 19.0 - 186.0 K/uL  CBC     Status: Abnormal   Collection Time: 04/02/14  5:43 AM  Result Value Ref Range   WBC 10.5 4.0 - 10.5 K/uL   RBC 3.72 (L) 3.87 - 5.11 MIL/uL   Hemoglobin 9.6 (L) 12.0 - 15.0 g/dL   HCT 30.7 (L) 36.0 - 46.0 %   MCV 82.5 78.0 - 100.0 fL   MCH 25.8 (L) 26.0 - 34.0 pg   MCHC 31.3 30.0 - 36.0 g/dL   RDW 16.3 (H) 11.5 - 15.5 %   Platelets 512 (H) 150 - 400 K/uL  Heparin level (unfractionated)     Status: None   Collection Time: 04/02/14  5:43 AM  Result Value Ref Range   Heparin Unfractionated 0.33 0.30 - 0.70 IU/mL    Comment:        IF HEPARIN RESULTS ARE  BELOW EXPECTED VALUES, AND PATIENT DOSAGE HAS BEEN CONFIRMED, SUGGEST FOLLOW UP TESTING OF ANTITHROMBIN III LEVELS.   Basic metabolic panel     Status: Abnormal   Collection Time: 04/02/14  5:43 AM  Result Value Ref Range   Sodium 139 137 - 147 mEq/L   Potassium 4.0 3.7 - 5.3 mEq/L    Comment: DELTA CHECK NOTED   Chloride 104 96 - 112 mEq/L   CO2 18 (L) 19 - 32 mEq/L   Glucose, Bld 112 (H) 70 - 99 mg/dL   BUN 9 6 - 23 mg/dL   Creatinine, Ser 1.09 0.50 - 1.10 mg/dL   Calcium 8.2 (L) 8.4 - 10.5 mg/dL   GFR calc non Af Amer 46 (L) >90 mL/min   GFR calc Af Amer 54 (L) >90 mL/min    Comment: (NOTE) The eGFR has been calculated using the CKD EPI equation. This calculation has not been validated in all clinical situations. eGFR's persistently <90 mL/min signify possible Chronic Kidney Disease.    Anion gap 17 (H) 5 - 15  D-dimer, quantitative     Status: Abnormal   Collection Time: 04/02/14 10:58 AM  Result Value Ref Range   D-Dimer, Quant 3.27 (H) 0.00 - 0.48 ug/mL-FEU    Comment:        AT THE INHOUSE ESTABLISHED CUTOFF VALUE OF 0.48 ug/mL FEU, THIS ASSAY HAS BEEN DOCUMENTED IN THE LITERATURE TO HAVE A SENSITIVITY AND NEGATIVE PREDICTIVE VALUE OF AT LEAST 98 TO 99%.  THE TEST RESULT SHOULD BE CORRELATED WITH AN ASSESSMENT OF THE CLINICAL PROBABILITY OF DVT / VTE.   Blood gas, arterial     Status: Abnormal   Collection Time: 04/02/14 11:40 AM  Result Value Ref Range   O2 Content 5.0 L/min   Delivery systems NASAL CANNULA    pH, Arterial 7.145 (LL) 7.350 - 7.450    Comment: CRITICAL RESULT CALLED TO, READ BACK BY AND VERIFIED WITH: GRAY,M.RN 04/02/14 AT 1150 BY BROADNAX,L.RRT    pCO2 arterial 33.0 (L) 35.0 - 45.0 mmHg   pO2, Arterial 73.7 (L) 80.0 - 100.0 mmHg  Bicarbonate 10.9 (L) 20.0 - 24.0 mEq/L   TCO2 10.8 0 - 100 mmol/L   Acid-base deficit 16.3 (H) 0.0 - 2.0 mmol/L   O2 Saturation 87.2 %   Patient temperature 37.0    Collection site LEFT RADIAL    Drawn by  [id number]    Sample type ARTERIAL    Allens test (pass/fail) PASS PASS  Troponin I (q 6hr x 3)     Status: None   Collection Time: 04/02/14 11:45 AM  Result Value Ref Range   Troponin I <0.30 <0.30 ng/mL    Comment:        Due to the release kinetics of cTnI, a negative result within the first hours of the onset of symptoms does not rule out myocardial infarction with certainty. If myocardial infarction is still suspected, repeat the test at appropriate intervals.   Phosphorus     Status: Abnormal   Collection Time: 04/02/14 11:45 AM  Result Value Ref Range   Phosphorus 5.0 (H) 2.3 - 4.6 mg/dL  Magnesium     Status: None   Collection Time: 04/02/14 11:45 AM  Result Value Ref Range   Magnesium 1.6 1.5 - 2.5 mg/dL  Pro b natriuretic peptide     Status: Abnormal   Collection Time: 04/02/14 11:45 AM  Result Value Ref Range   Pro B Natriuretic peptide (BNP) 25794.0 (H) 0 - 450 pg/mL  Blood gas, arterial     Status: Abnormal   Collection Time: 04/02/14  3:10 PM  Result Value Ref Range   FIO2 100.00 %   Delivery systems VENTILATOR    Mode PRESSURE REGULATED VOLUME CONTROL    VT 530 mL   Rate 15 resp/min   Peep/cpap 0 cm H20   pH, Arterial 6.996 (LL) 7.350 - 7.450    Comment: CRITICAL RESULT CALLED TO, READ BACK BY AND VERIFIED WITH: GRAY,M.RN 04/02/14 AT 1528 BY BROADNAX,L.RRT    pCO2 arterial 34.6 (L) 35.0 - 45.0 mmHg   pO2, Arterial 110.0 (H) 80.0 - 100.0 mmHg   Bicarbonate 8.0 (L) 20.0 - 24.0 mEq/L   TCO2 8.3 0 - 100 mmol/L   Acid-base deficit 21.0 (H) 0.0 - 2.0 mmol/L   O2 Saturation 92.8 %   Patient temperature 37.0    Collection site LEFT RADIAL    Drawn by [id number]    Sample type ARTERIAL    Allens test (pass/fail) PASS PASS  Basic metabolic panel     Status: Abnormal   Collection Time: 04/02/14  4:17 PM  Result Value Ref Range   Sodium 141 137 - 147 mEq/L   Potassium 5.5 (H) 3.7 - 5.3 mEq/L    Comment: DELTA CHECK NOTED   Chloride 105 96 - 112 mEq/L   CO2 9 (LL)  19 - 32 mEq/L    Comment: RESULT REPEATED AND VERIFIED CRITICAL RESULT CALLED TO, READ BACK BY AND VERIFIED WITH: HILTON,L ON 04/02/2014 AT 1715 BY ISLEY,B    Glucose, Bld 67 (L) 70 - 99 mg/dL   BUN 10 6 - 23 mg/dL   Creatinine, Ser 1.30 (H) 0.50 - 1.10 mg/dL   Calcium 7.3 (L) 8.4 - 10.5 mg/dL   GFR calc non Af Amer 37 (L) >90 mL/min   GFR calc Af Amer 43 (L) >90 mL/min    Comment: (NOTE) The eGFR has been calculated using the CKD EPI equation. This calculation has not been validated in all clinical situations. eGFR's persistently <90 mL/min signify possible Chronic Kidney Disease.    Anion  gap 27 (H) 5 - 15  Troponin I (q 6hr x 3)     Status: Abnormal   Collection Time: 04/02/14  6:20 PM  Result Value Ref Range   Troponin I 0.60 (HH) <0.30 ng/mL    Comment:        Due to the release kinetics of cTnI, a negative result within the first hours of the onset of symptoms does not rule out myocardial infarction with certainty. If myocardial infarction is still suspected, repeat the test at appropriate intervals. RESULT REPEATED AND VERIFIED CRITICAL RESULT CALLED TO, READ BACK BY AND VERIFIED WITH: HAMMOCK,J AT 1905 ON 04/02/2014 BY ISLEY,B   Lactic acid, plasma     Status: Abnormal   Collection Time: 04/02/14  6:20 PM  Result Value Ref Range   Lactic Acid, Venous 14.3 (H) 0.5 - 2.2 mmol/L  Procalcitonin - Baseline     Status: None   Collection Time: 04/02/14  6:20 PM  Result Value Ref Range   Procalcitonin 1.22 ng/mL    Comment:        Interpretation: PCT > 0.5 ng/mL and <= 2 ng/mL: Systemic infection (sepsis) is possible, but other conditions are known to elevate PCT as well. (NOTE)         ICU PCT Algorithm               Non ICU PCT Algorithm    ----------------------------     ------------------------------         PCT < 0.25 ng/mL                 PCT < 0.1 ng/mL     Stopping of antibiotics            Stopping of antibiotics       strongly encouraged.                strongly encouraged.    ----------------------------     ------------------------------       PCT level decrease by               PCT < 0.25 ng/mL       >= 80% from peak PCT       OR PCT 0.25 - 0.5 ng/mL          Stopping of antibiotics                                             encouraged.     Stopping of antibiotics           encouraged.    ----------------------------     ------------------------------       PCT level decrease by              PCT >= 0.25 ng/mL       < 80% from peak PCT        AND PCT >= 0.5 ng/mL             Continuing antibiotics                                              encouraged.       Continuing antibiotics            encouraged.    ----------------------------     ------------------------------  PCT level increase compared          PCT > 0.5 ng/mL         with peak PCT AND          PCT >= 0.5 ng/mL             Escalation of antibiotics                                          strongly encouraged.      Escalation of antibiotics        strongly encouraged.   Basic metabolic panel     Status: Abnormal   Collection Time: 04/02/14  8:59 PM  Result Value Ref Range   Sodium 138 137 - 147 mEq/L   Potassium 5.9 (H) 3.7 - 5.3 mEq/L   Chloride 102 96 - 112 mEq/L   CO2 6 (LL) 19 - 32 mEq/L    Comment: RESULT REPEATED AND VERIFIED CRITICAL RESULT CALLED TO, READ BACK BY AND VERIFIED WITH: HAMMOCK,S AT 2145 ON 04/02/2014 BY ISLEY,B    Glucose, Bld 77 70 - 99 mg/dL   BUN 11 6 - 23 mg/dL   Creatinine, Ser 9.96 (H) 0.50 - 1.10 mg/dL   Calcium 7.1 (L) 8.4 - 10.5 mg/dL   GFR calc non Af Amer 31 (L) >90 mL/min   GFR calc Af Amer 36 (L) >90 mL/min    Comment: (NOTE) The eGFR has been calculated using the CKD EPI equation. This calculation has not been validated in all clinical situations. eGFR's persistently <90 mL/min signify possible Chronic Kidney Disease.    Anion gap 30 (H) 5 - 15  Blood gas, arterial     Status: Abnormal   Collection Time: 04/02/14   9:56 PM  Result Value Ref Range   FIO2 100.00 %   Delivery systems VENTILATOR    Mode PRESSURE REGULATED VOLUME CONTROL    VT 550 mL   Rate 18 resp/min   pH, Arterial 6.963 (LL) 7.350 - 7.450    Comment: CRITICAL RESULT CALLED TO, READ BACK BY AND VERIFIED WITH: SHERRI HAMMOCK,RN BY K KNICK,RRT RCP ON 04/02/2014 AT 2204    pCO2 arterial 32.1 (L) 35.0 - 45.0 mmHg   pO2, Arterial 79.1 (L) 80.0 - 100.0 mmHg   Bicarbonate 6.9 (L) 20.0 - 24.0 mEq/L   TCO2 7.3 0 - 100 mmol/L   Acid-base deficit 22.5 (H) 0.0 - 2.0 mmol/L   O2 Saturation 86.2 %   Patient temperature 37.0    Collection site LEFT RADIAL    Drawn by [id number]    Sample type ARTERIAL    Allens test (pass/fail) PASS PASS  Troponin I (q 6hr x 3)     Status: Abnormal   Collection Time: 04/03/14 12:36 AM  Result Value Ref Range   Troponin I 0.72 (HH) <0.30 ng/mL    Comment:        Due to the release kinetics of cTnI, a negative result within the first hours of the onset of symptoms does not rule out myocardial infarction with certainty. If myocardial infarction is still suspected, repeat the test at appropriate intervals. CRITICAL VALUE NOTED.  VALUE IS CONSISTENT WITH PREVIOUSLY REPORTED AND CALLED VALUE.   CBC     Status: Abnormal   Collection Time: 04/03/14  4:29 AM  Result Value Ref Range   WBC 47.8 (H) 4.0 - 10.5  K/uL    Comment: RESULT REPEATED AND VERIFIED WHITE COUNT CONFIRMED ON SMEAR    RBC 3.96 3.87 - 5.11 MIL/uL   Hemoglobin 10.4 (L) 12.0 - 15.0 g/dL   HCT 34.9 (L) 36.0 - 46.0 %   MCV 88.1 78.0 - 100.0 fL   MCH 26.3 26.0 - 34.0 pg   MCHC 29.8 (L) 30.0 - 36.0 g/dL   RDW 17.0 (H) 11.5 - 15.5 %   Platelets 140 (L) 150 - 400 K/uL    Comment: RESULT REPEATED AND VERIFIED SPECIMEN CHECKED FOR CLOTS PLATELETS APPEAR ADEQUATE SMEAR STAINED AND AVAILABLE FOR REVIEW DELTA CHECK NOTED LARGE PLATELETS PRESENT   Heparin level (unfractionated)     Status: Abnormal   Collection Time: 04/03/14  4:29 AM  Result  Value Ref Range   Heparin Unfractionated <0.10 (L) 0.30 - 0.70 IU/mL    Comment:        IF HEPARIN RESULTS ARE BELOW EXPECTED VALUES, AND PATIENT DOSAGE HAS BEEN CONFIRMED, SUGGEST FOLLOW UP TESTING OF ANTITHROMBIN III LEVELS.   Basic metabolic panel     Status: Abnormal   Collection Time: 04/03/14  4:29 AM  Result Value Ref Range   Sodium 140 137 - 147 mEq/L   Potassium 5.2 3.7 - 5.3 mEq/L   Chloride 99 96 - 112 mEq/L   CO2 9 (LL) 19 - 32 mEq/L    Comment: CRITICAL RESULT CALLED TO, READ BACK BY AND VERIFIED WITH: LEE,B AT 5:35AM ON 04/03/14 BY FESTERMAN,C    Glucose, Bld 193 (H) 70 - 99 mg/dL   BUN 12 6 - 23 mg/dL   Creatinine, Ser 1.72 (H) 0.50 - 1.10 mg/dL   Calcium 6.9 (L) 8.4 - 10.5 mg/dL   GFR calc non Af Amer 27 (L) >90 mL/min   GFR calc Af Amer 31 (L) >90 mL/min    Comment: (NOTE) The eGFR has been calculated using the CKD EPI equation. This calculation has not been validated in all clinical situations. eGFR's persistently <90 mL/min signify possible Chronic Kidney Disease.   Procalcitonin     Status: None   Collection Time: 04/03/14  4:29 AM  Result Value Ref Range   Procalcitonin 7.08 ng/mL    Comment:        Interpretation: PCT > 2 ng/mL: Systemic infection (sepsis) is likely, unless other causes are known. (NOTE)         ICU PCT Algorithm               Non ICU PCT Algorithm    ----------------------------     ------------------------------         PCT < 0.25 ng/mL                 PCT < 0.1 ng/mL     Stopping of antibiotics            Stopping of antibiotics       strongly encouraged.               strongly encouraged.    ----------------------------     ------------------------------       PCT level decrease by               PCT < 0.25 ng/mL       >= 80% from peak PCT       OR PCT 0.25 - 0.5 ng/mL          Stopping of antibiotics  encouraged.     Stopping of antibiotics           encouraged.     ----------------------------     ------------------------------       PCT level decrease by              PCT >= 0.25 ng/mL       < 80% from peak PCT        AND PCT >= 0.5 ng/mL            Continuing antibiotics                                               encouraged.       Continuing antibiotics            encouraged.    ----------------------------     ------------------------------     PCT level increase compared          PCT > 0.5 ng/mL         with peak PCT AND          PCT >= 0.5 ng/mL             Escalation of antibiotics                                          strongly encouraged.      Escalation of antibiotics        strongly encouraged.   Blood gas, arterial     Status: Abnormal   Collection Time: 04/03/14  5:27 AM  Result Value Ref Range   FIO2 100.00 %   Delivery systems VENTILATOR    Mode PRESSURE REGULATED VOLUME CONTROL    VT 550 mL   Rate 24 resp/min   Peep/cpap 3.0 cm H20   pH, Arterial 7.172 (LL) 7.350 - 7.450    Comment: CRITICAL RESULT CALLED TO, READ BACK BY AND VERIFIED WITH: SHERRI HAMMOCK RN BY K KNICK RRT RCP ON 04/03/2014 AT 0531    pCO2 arterial 26.1 (L) 35.0 - 45.0 mmHg   pO2, Arterial 89.4 80.0 - 100.0 mmHg   Bicarbonate 9.2 (L) 20.0 - 24.0 mEq/L   TCO2 9.0 0 - 100 mmol/L   Acid-base deficit 17.7 (H) 0.0 - 2.0 mmol/L   O2 Saturation 94.0 %   Patient temperature 37.0    Collection site LEFT RADIAL    Drawn by [id number]    Sample type ARTERIAL    Allens test (pass/fail) PASS PASS    Dg Chest 1 View  04/02/2014   CLINICAL DATA:  Acute worsening shortness of breath  EXAM: CHEST - 1 VIEW  COMPARISON:  03/14/2014, 02/25/2014  FINDINGS: Cardiomegaly with worsening vascular and interstitial prominence compatible with edema. Effusions are evident for with small effusions bilaterally. Pattern compatible with CHF. There is associated bibasilar atelectasis/ collapse. No pneumothorax. Trachea is midline. Atherosclerosis of the aorta.  IMPRESSION: Cardiomegaly  with new developing interstitial edema and small effusions compatible with CHF  Bibasilar atelectasis.   Electronically Signed   By: Daryll Brod M.D.   On: 04/02/2014 11:02   Dg Chest 1v Repeat Same Day  04/02/2014   CLINICAL DATA:  Intubation.  Post intubation radiographs.  EXAM: CHEST - 1 VIEW SAME DAY  COMPARISON:  04/02/2014 at 1000 hr.  FINDINGS: Support apparatus: Interval intubation with the endotracheal tube tip 64 mm from the carina. Monitoring leads project over the chest. Defibrillator pads overlie the chest.  Cardiomediastinal Silhouette: Cardiomegaly. The cardiopericardial silhouette is better seen than on the prior exam due to improved aeration.  Lungs: Improved lung volumes with worse moderate to severe CHF with bilateral pulmonary edema. No pneumothorax.  Effusions:  Probable small LEFT  Other:  None.  IMPRESSION: 1. Interval intubation. 2. Improved lung volumes with moderate to severe CHF which appears worse in the interval since the prior exam. Allowing for changes in pulmonary volumes, of the amount of pulmonary edema is greater.   Electronically Signed   By: Dereck Ligas M.D.   On: 04/02/2014 12:44   Dg Chest Port 1 View  04/03/2014   CLINICAL DATA:  Intubated.  Central line placement.  EXAM: PORTABLE CHEST - 1 VIEW  COMPARISON:  Yesterday.  FINDINGS: The endotracheal tube is in satisfactory position. No interval central venous catheter is seen. No pneumothorax. Stable enlarged cardiac silhouette and diffuse prominence of the pulmonary vasculature and interstitial markings. No significant change bilateral airspace opacity. Small right pleural effusion. Unremarkable bones.  IMPRESSION: 1. No central venous catheter is seen. 2. Stable changes of congestive heart failure.   Electronically Signed   By: Enrique Sack M.D.   On: 04/03/2014 07:24    Review of Systems  Unable to perform ROS: intubated  Gastrointestinal: Positive for nausea, vomiting, abdominal pain and diarrhea.   Blood  pressure 131/58, pulse 102, temperature 96.6 F (35.9 C), temperature source Axillary, resp. rate 24, height $RemoveBe'5\' 7"'LGcohmwaw$  (1.702 m), weight 68.9 kg (151 lb 14.4 oz), SpO2 97 %. Physical Exam  Constitutional: No distress.  Patient intubated and sedated  Eyes: No scleral icterus.  Cardiovascular:  Irigular rate and rythm  Respiratory: No respiratory distress. She has wheezes. She has rales.  GI: She exhibits no distension.  Musculoskeletal: She exhibits no edema.    Assessment/Plan: Problem #1 acute kidney injury: Most likely from ATN associated with her hypotension. Since patient has nausea vomiting and also diarrhea renal syndrome also need to be entertained as a possibility. Presently patient is oliguric. Problem #2 hypokalemia: Most likely from GI loss associated with her diarrhea. Problem #3 metabolic acidosis: This also seems to be related to her diarrhea. Her pH is 7.1 with CO2 of 9 and seems to be improving. Problem #4 history of atrial fibrillation Problem #5 hypotension and status post cardiac arrest and presently intubated Problem #6 anemia Problem #7 diverticulitis with colonic perforation. Plan: We'll check urine sodium, potassium and chloride We'll DC IV fluid with potassium We'll start patient on 1/4 saline with sodium bicarbonate of 100 mEq at 125 mL per hour We'll start patient on Lasix 80 mg IV twice a day We'll check her basic metabolic panel in the morning  Jowan Skillin S 04/03/2014, 9:54 AM

## 2014-04-03 NOTE — Progress Notes (Signed)
CRITICAL VALUE ALERT  Critical value received:  ABG results  Date of notification:  04/02/2014  Time of notification:  2202  Critical value read back:Yes.    Nurse who received alert:  Wonda Cheng, RN  MD notified (1st page):  Dr Everette Rank  Time of first page:  2205  MD notified (2nd page):   Time of second page:  Responding MD:  Dr Everette Rank  Time MD responded: 2230  Values of pH 6.963, CO2 32.1, O2 79.1, Bicarb 6.9, Acid-base 22.5  No new orders received from Dr Everette Rank at this time, instructed to updated E-link with panic values; Dr Lamonte Sakai called about critical's orders received for vent changes

## 2014-04-03 NOTE — Progress Notes (Signed)
Pt had a cardiac arrest yesterday and is now intubated.  Due to change in status, PT is no longer appropriate.  We will d/c orders.  Please contact me if there are any concerns about this.  Pager:  [phone]

## 2014-04-03 NOTE — Consult Note (Signed)
Primary cardiologist: Dr. Dorris Carnes Consulting cardiologist: Dr. Satira Sark  Reason for consultation: Cardiac arrest, abnormal troponin I  Clinical Summary Ms. Negrette is an 80 y.o.female admitted to the hospital on November 21 with abdominal pain and nausea. CT of the abdomen and pelvis demonstrates moderate to extensive inflammatory changes and scattered free air within the left and posterior pelvis suspected to be related to a ruptured diverticulum.  She was seen by GI and felt to have a walled off perforation without abscess, treated with antibiotics. She also has associated Clostridium difficile colitis. Clinical course deteriorated and she had a recent cardiac arrest with resuscitation, now on the ventilator and requiring blood pressure support with dopamine and Levophed. She is not able to provide any history.  During this course she has been found to have abnormal troponin I levels, peak so far is 0.72. ECG shows an old left bundle branch block. Follow-up echocardiogram is pending.  She has had cardiac evaluation with Dr. Harrington Challenger, seen earlier in the year with history of paroxysmal atrial fibrillation, and also cardiomyopathy with LVEF 40-45% back in February. She has been managed medically   Allergies  Allergen Reactions  . Thyroid Hormones Hives and Rash    proparathyroid thyroid medication    Medications Scheduled Medications: . antiseptic oral rinse  7 mL Mouth Rinse QID  . chlorhexidine  15 mL Mouth Rinse BID  . ciprofloxacin  400 mg Intravenous Q12H  . citalopram  20 mg Oral Daily  . ferrous sulfate  325 mg Oral Q breakfast  . hydrocortisone sod succinate (SOLU-CORTEF) inj  100 mg Intravenous Q6H  . levothyroxine  50 mcg Intravenous Once per day on Sun Tue Wed Fri Sat  . LORazepam  1 mg Oral QHS  . metoprolol tartrate  25 mg Oral BID  . metronidazole  500 mg Intravenous Q8H  . potassium chloride  40 mEq Oral Daily  . saccharomyces boulardii  250 mg Oral BID    . sodium chloride  10-40 mL Intracatheter Q12H  . sodium chloride  10-40 mL Intracatheter Q12H  . vancomycin  250 mg Oral 4 times per day    Infusions: . 0.9 % NaCl with KCl 40 mEq / L 50 mL/hr at 04/03/14 0600  . DOPamine 20 mcg/kg/min (04/03/14 0800)  . fentaNYL infusion INTRAVENOUS 75 mcg/hr (04/03/14 0800)  . heparin 1,300 Units/hr (04/03/14 0800)  . norepinephrine (LEVOPHED) Adult infusion 27 mcg/min (04/03/14 0901)  .  sodium bicarbonate  infusion 1000 mL 125 mL/hr at 04/03/14 0600    PRN Medications: albuterol, fentaNYL, fentaNYL, LORazepam, menthol-cetylpyridinium, metoprolol, midazolam, midazolam, morphine injection, ondansetron **OR** ondansetron (ZOFRAN) IV, promethazine, sodium chloride, sodium chloride   Past Medical History  Diagnosis Date  . Osteoarthritis   . Macular degeneration   . Essential hypertension   . Hypothyroidism   . Thyroid cancer   . GI bleed   . Atrial fibrillation   . C. difficile colitis   . Diverticulitis   . Shingles   . Lupus   . LBBB (left bundle branch block)   . Secondary cardiomyopathy     LVEF 40-45% February 2015    Past Surgical History  Procedure Laterality Date  . Thyroidectomy    . Colonoscopy  03/17/2011    Procedure: COLONOSCOPY;  Surgeon: Rogene Houston, MD;  Location: [facility];  Service: Endoscopy;  Laterality: N/A;  . Cyst removal neck      Family History  Problem Relation Age of Onset  .  Tuberculosis Mother   . Thrombosis Father   . Deep vein thrombosis Sister   . Deep vein thrombosis Father     Social History Ms. Fusaro reports that she has never smoked. She has never used smokeless tobacco. Ms. Grandberry reports that she does not drink alcohol.  Review of Systems Unable to obtain complete review of systems since patient sedated on the ventilator.  Physical Examination Blood pressure 105/57, pulse 103, temperature 96.6 F (35.9 C), temperature source Axillary, resp. rate 24, height 5\' 7"  (1.702 m),  weight 151 lb 14.4 oz (68.9 kg), SpO2 97 %.  Intake/Output Summary (Last 24 hours) at 04/03/14 0901 Last data filed at 04/03/14 0828  Gross per 24 hour  Intake 7152.8 ml  Output    400 ml  Net 6752.8 ml   Telemetry: Currently sinus rhythm. Artifact noted, not VT.  Elderly woman, sedated on the ventilator. HEENT: Conjunctiva and lids normal, oropharynx unable to be examined with ET tube in place. Neck: Supple, difficult to assess JVP, no carotid bruits, no thyromegaly. Lungs: Clear to auscultation anteriorly, nonlabored breathing at rest. Cardiac:  Distant, regular rate and rhythm, no S3 or significant systolic murmur, no pericardial rub. Abdomen: Soft, very diminished bowel sounds, no guarding or rebound. Extremities: No pitting edema, distal pulses 1-2+. Skin: Warm and dry. Musculoskeletal: No kyphosis. Neuropsychiatric: Patient sedated .   Lab Results  Basic Metabolic Panel:  Recent Labs Lab 04/01/14 0522 04/02/14 0543 04/02/14 1145 04/02/14 1617 04/02/14 2059 04/03/14 0429  NA 139 139  --  141 138 140  K 3.3* 4.0  --  5.5* 5.9* 5.2  CL 103 104  --  105 102 99  CO2 20 18*  --  9* 6* 9*  GLUCOSE 96 112*  --  67* 77 193*  BUN 4* 9  --  10 11 12   CREATININE 0.85 1.09  --  1.30* 1.50* 1.72*  CALCIUM 7.8* 8.2*  --  7.3* 7.1* 6.9*  MG  --   --  1.6  --   --   --   PHOS  --   --  5.0*  --   --   --     Liver Function Tests:  Recent Labs Lab 03/14/2014 1633 03/29/14 0644  AST 12 11  ALT 8 6  ALKPHOS 116 93  BILITOT 0.6 0.9  PROT 8.1 6.6  ALBUMIN 3.2* 2.6*    CBC:  Recent Labs Lab 03/21/2014 1633  03/30/14 0520 03/31/14 0518 04/01/14 0522 04/02/14 0543 04/03/14 0429  WBC 10.7*  < > 10.1 7.1 9.8 10.5 47.8*  NEUTROABS 8.3*  --   --   --   --   --   --   HGB 10.8*  < > 9.5* 9.2* 9.1* 9.6* 10.4*  HCT 34.0*  < > 29.8* 28.3* 28.6* 30.7* 34.9*  MCV 83.1  < > 82.5 82.3 82.7 82.5 88.1  PLT 642*  < > 513* 485* 513* 512* 140*  < > = values in this interval not  displayed.  Cardiac Enzymes:  Recent Labs Lab 04/02/14 1145 04/02/14 1820 04/03/14 0036  TROPONINI <0.30 0.60* 0.72*    ECG Recent tracings show sinus bradycardia with LBBB and prolonged PR interval.   Imaging EXAM: PORTABLE CHEST - 1 VIEW  COMPARISON: Yesterday.  FINDINGS: The endotracheal tube is in satisfactory position. No interval central venous catheter is seen. No pneumothorax. Stable enlarged cardiac silhouette and diffuse prominence of the pulmonary vasculature and interstitial markings. No significant change bilateral  airspace opacity. Small right pleural effusion. Unremarkable bones.  IMPRESSION: 1. No central venous catheter is seen. 2. Stable changes of congestive heart failure.   Impression  1. Status post cardiac arrest in the setting of presumed sepsis with ruptured diverticulum and acidosis. Currently sedated on the ventilator and requiring blood pressure support including dopamine and Levphed. Now has DO NOT RESUSCITATE status.  2. Abnormal troponin I, likely demand ischemia in the setting of above physiologic stress. ECG shows old left bundle branch block.  3. History of PAF, currently in sinus rhythm.  4. Cardiomyopathy, LVEF 40-45% assess February, follow-up echocardiogram pending.   Recommendations  We will follow-up on the echocardiogram pending for reassessment of LVEF, although at this point main issues include supportive measures, broad-spectrum antibiotics, and determining if any further intervention is required as it relates to the patient's abdominal status. She would be high risk for any surgical interventions this point. Overall prognosis is poor.  Satira Sark, M.D., F.A.C.C.

## 2014-04-03 NOTE — Plan of Care (Signed)
Problem: Progression Barriers to Patient Restraint Goal: Returns to Baseline, No longer Needs Restraint Outcome: Progressing Maintain while pulling at lines and tubes need to maintain care

## 2014-04-03 NOTE — Progress Notes (Signed)
[place] for Heparin Indication: atrial fibrillation, bridge therapy.  Allergies  Allergen Reactions  . Thyroid Hormones Hives and Rash    proparathyroid thyroid medication   Patient Measurements: Height: 5\' 7"  (170.2 cm) Weight: 151 lb 14.4 oz (68.9 kg) IBW/kg (Calculated) : 61.6  Vital Signs: Temp: 96.6 F (35.9 C) (11/27 0400) Temp Source: Axillary (11/27 0400) BP: 85/50 mmHg (11/27 1245) Pulse Rate: 88 (11/27 1245)  Labs:  Recent Labs  04/01/14 0522 04/02/14 0543 04/02/14 1145 04/02/14 1617 04/02/14 1820 04/02/14 2059 04/03/14 0036 04/03/14 0429 04/03/14 1407  HGB 9.1* 9.6*  --   --   --   --   --  10.4*  --   HCT 28.6* 30.7*  --   --   --   --   --  34.9*  --   PLT 513* 512*  --   --   --   --   --  140*  --   APTT 78*  --   --   --   --   --   --   --   --   HEPARINUNFRC 0.50 0.33  --   --   --   --   --  <0.10* 0.19*  CREATININE 0.85 1.09  --  1.30*  --  1.50*  --  1.72*  --   TROPONINI  --   --  <0.30  --  0.60*  --  0.72*  --   --     Estimated Creatinine Clearance: 24.9 mL/min (by C-G formula based on Cr of 1.72).  Medical History: Past Medical History  Diagnosis Date  . Osteoarthritis   . Macular degeneration   . Essential hypertension   . Hypothyroidism   . Thyroid cancer   . GI bleed   . Atrial fibrillation   . C. difficile colitis   . Diverticulitis   . Shingles   . Lupus   . LBBB (left bundle branch block)   . Secondary cardiomyopathy     LVEF 40-45% February 2015   Medications:  Prescriptions prior to admission  Medication Sig Dispense Refill Last Dose  . amLODipine (NORVASC) 5 MG tablet Take 5 mg by mouth daily.   03/29/2014  . apixaban (ELIQUIS) 5 MG TABS tablet Take 1 tablet (5 mg total) by mouth 2 (two) times daily. 60 tablet 3 03/27/2014 at 11:30  . capsaicin (ZOSTRIX) 0.025 % cream Apply topically 2 (two) times daily. (Patient taking differently: Apply 1 application topically 2 (two) times  daily as needed. ) 60 g 0 Past Month  . HYDROcodone-acetaminophen (NORCO/VICODIN) 5-325 MG per tablet Take 1 tablet by mouth every 5 (five) hours as needed (pain).   04/02/2014  . levothyroxine (SYNTHROID, LEVOTHROID) 100 MCG tablet Take 100 mcg by mouth daily. Do not take synthroid on Mon and Thursdays   03/16/2014  . lisinopril (PRINIVIL,ZESTRIL) 10 MG tablet Take 1 tablet (10 mg total) by mouth daily. 30 tablet 3 03/30/2014  . metoprolol succinate (TOPROL-XL) 25 MG 24 hr tablet Take 12.5 mg by mouth daily.   04/02/2014 at 1130  . ondansetron (ZOFRAN) 4 MG tablet Take 4 mg by mouth every 8 (eight) hours as needed for nausea or vomiting.   03/27/2014  . pregabalin (LYRICA) 50 MG capsule Take 50 mg by mouth 3 (three) times daily as needed (Pain).    03/27/2014  . Probiotic Product (ALIGN PO) Take 1 tablet by mouth daily.   Past Week  .  ranitidine (ZANTAC) 150 MG capsule Take 150 mg by mouth 2 (two) times daily.   03/27/2014  . Red Yeast Rice Extract (RED YEAST RICE PO) Take 1 capsule by mouth daily.   03/19/2014   Assessment: 40 yoF admitted with diverticulitis with contained perforation.  She was on Apixaban @ home for Afib. She remains NPO due to severe nausea & vomiting so is currently being bridged with IV heparin.  Patient transferred to ICU after code, CPR Heparin level  subtherapeutic still sub therapeutic after rate increase and bolus  No evidence of overt GI bleeding, however platelets have dropped    Goal of Therapy:  Heparin level 0.3-0.7 Monitor platelets by anticoagulation protocol: Yes   Plan:  Increase heparin to 1450 units/hr Heparin level in 6 hours Daily heparin level & CBC while on heparin   Abner Greenspan, [place] Bennett 04/03/2014,2:38 PM

## 2014-04-03 NOTE — Progress Notes (Signed)
  Echocardiogram 2D Echocardiogram has been performed.  [place], [place] 04/03/2014, 10:40 AM

## 2014-04-03 NOTE — Progress Notes (Signed)
[place] for Heparin Indication: atrial fibrillation, bridge therapy.  Allergies  Allergen Reactions  . Thyroid Hormones Hives and Rash    proparathyroid thyroid medication   Patient Measurements: Height: 5\' 7"  (170.2 cm) Weight: 151 lb 14.4 oz (68.9 kg) IBW/kg (Calculated) : 61.6  Vital Signs: Temp: 96.6 F (35.9 C) (11/27 0400) Temp Source: Axillary (11/27 0400) BP: 100/59 mmHg (11/27 0715) Pulse Rate: 104 (11/27 0715)  Labs:  Recent Labs  04/01/14 0522 04/02/14 0543 04/02/14 1145 04/02/14 1617 04/02/14 1820 04/02/14 2059 04/03/14 0036 04/03/14 0429  HGB 9.1* 9.6*  --   --   --   --   --  10.4*  HCT 28.6* 30.7*  --   --   --   --   --  34.9*  PLT 513* 512*  --   --   --   --   --  140*  APTT 78*  --   --   --   --   --   --   --   HEPARINUNFRC 0.50 0.33  --   --   --   --   --  <0.10*  CREATININE 0.85 1.09  --  1.30*  --  1.50*  --  1.72*  TROPONINI  --   --  <0.30  --  0.60*  --  0.72*  --     Estimated Creatinine Clearance: 24.9 mL/min (by C-G formula based on Cr of 1.72).  Medical History: Past Medical History  Diagnosis Date  . Osteoarthritis   . Macular degeneration   . Hypertension   . Thyroid disease   . Thyroid cancer   . GI bleed   . Atrial fibrillation   . C. difficile colitis   . Diverticulitis   . Shingles   . Lupus    Medications:  Prescriptions prior to admission  Medication Sig Dispense Refill Last Dose  . amLODipine (NORVASC) 5 MG tablet Take 5 mg by mouth daily.   04/02/2014  . apixaban (ELIQUIS) 5 MG TABS tablet Take 1 tablet (5 mg total) by mouth 2 (two) times daily. 60 tablet 3 03/08/2014 at 11:30  . capsaicin (ZOSTRIX) 0.025 % cream Apply topically 2 (two) times daily. (Patient taking differently: Apply 1 application topically 2 (two) times daily as needed. ) 60 g 0 Past Month  . HYDROcodone-acetaminophen (NORCO/VICODIN) 5-325 MG per tablet Take 1 tablet by mouth every 5 (five) hours as  needed (pain).   03/18/2014  . levothyroxine (SYNTHROID, LEVOTHROID) 100 MCG tablet Take 100 mcg by mouth daily. Do not take synthroid on Mon and Thursdays   03/16/2014  . lisinopril (PRINIVIL,ZESTRIL) 10 MG tablet Take 1 tablet (10 mg total) by mouth daily. 30 tablet 3 03/10/2014  . metoprolol succinate (TOPROL-XL) 25 MG 24 hr tablet Take 12.5 mg by mouth daily.   03/27/2014 at 1130  . ondansetron (ZOFRAN) 4 MG tablet Take 4 mg by mouth every 8 (eight) hours as needed for nausea or vomiting.   04/04/2014  . pregabalin (LYRICA) 50 MG capsule Take 50 mg by mouth 3 (three) times daily as needed (Pain).    03/27/2014  . Probiotic Product (ALIGN PO) Take 1 tablet by mouth daily.   Past Week  . ranitidine (ZANTAC) 150 MG capsule Take 150 mg by mouth 2 (two) times daily.   03/16/2014  . Red Yeast Rice Extract (RED YEAST RICE PO) Take 1 capsule by mouth daily.   03/15/2014   Assessment: 83 yoF  admitted with diverticulitis with contained perforation.  She was on Apixaban @ home for Afib. She remains NPO due to severe nausea & vomiting so is currently being bridged with IV heparin.  Patient transferred to ICU after code, CPR Heparin level  subtherapeutic this morning.   No evidence of overt GI bleeding, however platelets have dropped    Goal of Therapy:  Heparin level 0.3-0.7 Monitor platelets by anticoagulation protocol: Yes   Plan:  Heparin 2000 unit IV bolus Increase heparin to 1300 units/hr Heparin level in 6 hours Daily heparin level & CBC while on heparin   Abner Greenspan, Railee Bonillas Bennett 04/03/2014,7:43 AM

## 2014-04-03 NOTE — Progress Notes (Signed)
Nutrition Follow-up  DOCUMENTATION CODES Per approved criteria  -Severe malnutrition in the context of chronic illness   INTERVENTION: When pt is medically appropriate and if she is unable to wean:   Initiate Vital 1.2 @ 20 ml/hr via OGT and increase by 10 ml every 4 hours to goal rate of 40 ml/hr.   Add 30 ml Prostat TID   Tube feeding regimen provides 1452 kcal (97% of energy and 100% et protein needs), 117 grams of protein, and 779 ml of H2O.    Monitor pt for signs of refeeding given her malnourished state.   NUTRITION DIAGNOSIS: Inadequate oral intake related to altered GI function as evidenced by chronic unplanned weight loss 6% in 6 months and 13% in 9 months, NPO status.  Goal: Prevent additional weight loss and improve nutrition status by consistently meeting >90% of estimated needs; Not met.  Monitor:  Respiratory status, Nutrition support measures (protein-energy intake), reveiw available labs and weight changes  Reason for Assessment: Change in status  80 y.o. female  ASSESSMENT: Pt assessed by RD 09/15/13 during her admission for splenic infarct. At that time she c/o having  c. Diff since February and had shingles. She was eating a meal  with her husband at [place] on Friday which she describes as a few bites of green beans, potatoes and roast when she began to feel sick. Pt  says she has not eaten since. She presents : sigmoid diverticulitis with contained perforation and no acute surgery indicated per MD. Nutritionally, pt has additional weight loss of 8#, 6% wt loss in 6 months since her last admission in May of this year. She has continued to drink ensure at least 1 x per day up until this acute episode.   Patient had a decline and is currently intubated on ventilator support and pressor therapy. Labs: Phos-5.0, glucose 193, Creat. 1.72 trending up, Calcium 6.9 trending down, albumin (2.6 on 11/22). Her weight has severely increased 10% in 5 days.  MV: 12.4   L/min Temp (24hrs), Avg:96.6 F (35.9 C), Min:96.4 F (35.8 C), Max:96.8 F (36 C)  Propofol: 0 ml/hr . Pt on Fentanyl.  Height: Ht Readings from Last 1 Encounters:  04/02/14 $RemoveB'5\' 7"'ejILaiir$  (1.702 m)    Weight: Wt Readings from Last 1 Encounters:  04/03/14 151 lb 14.4 oz (68.9 kg)  admit wt 138# (03/15/2014)  Ideal Body Weight: 135#  Wt Readings from Last 10 Encounters:  04/03/14 151 lb 14.4 oz (68.9 kg)  02/25/14 138 lb 11.2 oz (62.914 kg)  09/13/13 144 lb 6.4 oz (65.5 kg)  09/10/13 146 lb (66.225 kg)  08/27/13 142 lb (64.411 kg)  07/17/13 137 lb (62.143 kg)  07/16/13 142 lb 14.4 oz (64.819 kg)  07/04/13 158 lb 1.1 oz (71.7 kg)  06/26/13 157 lb 6.5 oz (71.4 kg)  03/28/11 169 lb 6.4 oz (76.839 kg)    Usual Body Weight: 145# (six months ago) and 155-158# nine months ago (13% decr)   BMI:  Body mass index is 23.78 kg/(m^2). normal range  Estimated Nutritional Needs: Kcal: 1495 Protein:> 103 gr Fluid: > 1.5 liters daily  Skin: intact  Diet Order: Diet NPO time specified   EDUCATION NEEDS: -No education needs identified at this time   Intake/Output Summary (Last 24 hours) at 04/03/14 1159 Last data filed at 04/03/14 1158  Gross per 24 hour  Intake 7344.5 ml  Output    400 ml  Net 6944.5 ml    Last BM: 11/27 diarrhea  Labs:   Recent Labs Lab 04/02/14 1145 04/02/14 1617 04/02/14 2059 04/03/14 0429  NA  --  141 138 140  K  --  5.5* 5.9* 5.2  CL  --  105 102 99  CO2  --  9* 6* 9*  BUN  --  _0 CREATININE  --  1.30* 1.50* 1.72*  CALCIUM  --  7.3* 7.1* 6.9*  MG 1.6  --   --   --   PHOS 5.0*  --   --   --   GLUCOSE  --  67* 77 193*    CBG (last 3)  No results for input(s): GLUCAP in the last 72 hours.  Scheduled Meds: . antiseptic oral rinse  7 mL Mouth Rinse QID  . chlorhexidine  15 mL Mouth Rinse BID  . ciprofloxacin  400 mg Intravenous Q12H  . citalopram  20 mg Oral Daily  . ferrous sulfate  325 mg Oral Q breakfast  . furosemide  80 mg  Intravenous BID  . hydrocortisone sod succinate (SOLU-CORTEF) inj  100 mg Intravenous Q6H  . levothyroxine  50 mcg Intravenous Once per day on Sun Tue Wed Fri Sat  . LORazepam  1 mg Oral QHS  . metronidazole  500 mg Intravenous Q8H  . pantoprazole (PROTONIX) IV  40 mg Intravenous Q24H  . saccharomyces boulardii  250 mg Oral BID  . sodium chloride  10-40 mL Intracatheter Q12H  . sodium chloride  10-40 mL Intracatheter Q12H  . vancomycin  250 mg Oral 4 times per day    Continuous Infusions: . DOPamine 20 mcg/kg/min (04/03/14 1114)  . fentaNYL infusion INTRAVENOUS 75 mcg/hr (04/03/14 1100)  . heparin 1,300 Units/hr (04/03/14 1100)  . norepinephrine (LEVOPHED) Adult infusion 3 mcg/min (04/03/14 1158)  .  sodium bicarbonate infusion 1/4 NS 1000 mL 125 mL/hr at 04/03/14 1113    Past Medical History  Diagnosis Date  . Osteoarthritis   . Macular degeneration   . Essential hypertension   . Hypothyroidism   . Thyroid cancer   . GI bleed   . Atrial fibrillation   . C. difficile colitis   . Diverticulitis   . Shingles   . Lupus   . LBBB (left bundle branch block)   . Secondary cardiomyopathy     LVEF 40-45% February 2015    Past Surgical History  Procedure Laterality Date  . Thyroidectomy    . Colonoscopy  03/17/2011    Procedure: COLONOSCOPY;  Surgeon: Rogene Houston, MD;  Location: [facility];  Service: Endoscopy;  Laterality: N/A;  . Cyst removal neck     Colman Cater MS,RD,CSG,LDN Office: [phone] Pager: [phone]

## 2014-04-03 NOTE — Progress Notes (Signed)
ANTIBIOTIC CONSULT NOTE - INITIAL  Pharmacy Consult for Vancomycin Indication: Bacteremia  Allergies  Allergen Reactions  . Thyroid Hormones Hives and Rash    proparathyroid thyroid medication    Patient Measurements: Height: 5\' 7"  (170.2 cm) Weight: 151 lb 14.4 oz (68.9 kg) IBW/kg (Calculated) : 61.6 Adjusted Body Weight:   Vital Signs: Temp: 96.6 F (35.9 C) (11/27 0400) Temp Source: Axillary (11/27 0400) BP: 85/50 mmHg (11/27 1245) Pulse Rate: 88 (11/27 1245) Intake/Output from previous day: 11/26 0701 - 11/27 0700 In: 7012.7 [I.V.:6012.7; IV Piggyback:1000] Out: 350 [Urine:350] Intake/Output from this shift: Total I/O In: 447.8 [I.V.:447.8] Out: 50 [Urine:50]  Labs:  Recent Labs  04/01/14 0522 04/02/14 0543 04/02/14 1617 04/02/14 2059 04/03/14 0429  WBC 9.8 10.5  --   --  47.8*  HGB 9.1* 9.6*  --   --  10.4*  PLT 513* 512*  --   --  140*  CREATININE 0.85 1.09 1.30* 1.50* 1.72*   Estimated Creatinine Clearance: 24.9 mL/min (by C-G formula based on Cr of 1.72). No results for input(s): VANCOTROUGH, VANCOPEAK, VANCORANDOM, GENTTROUGH, GENTPEAK, GENTRANDOM, TOBRATROUGH, TOBRAPEAK, TOBRARND, AMIKACINPEAK, AMIKACINTROU, AMIKACIN in the last 72 hours.   Microbiology: Recent Results (from the past 720 hour(s))  Clostridium Difficile by PCR     Status: Abnormal   Collection Time: 03/11/2014 10:22 PM  Result Value Ref Range Status   C difficile by pcr POSITIVE (A) NEGATIVE Final    Comment: CRITICAL RESULT CALLED TO, READ BACK BY AND VERIFIED WITH:  HAMILTON,S @ 5790 ON 03/09/2014 BY WOODIE,J   Clostridium Difficile by PCR     Status: Abnormal   Collection Time: 04/01/14  1:00 PM  Result Value Ref Range Status   C difficile by pcr POSITIVE (A) NEGATIVE Final    Comment: CRITICAL RESULT CALLED TO, READ BACK BY AND VERIFIED WITH: SEIGLA,J. AT 1703 ON 04/01/2014 BY BAUGHAM,M.   Culture, blood (routine x 2)     Status: None (Preliminary result)   Collection Time:  04/02/14  6:20 PM  Result Value Ref Range Status   Specimen Description BLOOD LEFT ARM  Final   Special Requests BOTTLES DRAWN AEROBIC ONLY 4CC  Final   Culture   Final    GRAM POSITIVE COCCI Gram Stain Report Called to,Read Back By and Verified With: [facility] AT 3833 BY HUFFINES,S ON 04/03/14    Report Status PENDING  Incomplete    Medical History: Past Medical History  Diagnosis Date  . Osteoarthritis   . Macular degeneration   . Essential hypertension   . Hypothyroidism   . Thyroid cancer   . GI bleed   . Atrial fibrillation   . C. difficile colitis   . Diverticulitis   . Shingles   . Lupus   . LBBB (left bundle branch block)   . Secondary cardiomyopathy     LVEF 40-45% February 2015    Medications:  Scheduled:  . antiseptic oral rinse  7 mL Mouth Rinse QID  . chlorhexidine  15 mL Mouth Rinse BID  . ciprofloxacin  400 mg Intravenous Q12H  . citalopram  20 mg Oral Daily  . ferrous sulfate  325 mg Oral Q breakfast  . furosemide  80 mg Intravenous BID  . hydrocortisone sod succinate (SOLU-CORTEF) inj  100 mg Intravenous Q6H  . levothyroxine  50 mcg Intravenous Once per day on Sun Tue Wed Fri Sat  . LORazepam  1 mg Oral QHS  . metronidazole  500 mg Intravenous Q8H  . pantoprazole (  PROTONIX) IV  40 mg Intravenous Q24H  . saccharomyces boulardii  250 mg Oral BID  . sodium chloride  10-40 mL Intracatheter Q12H  . sodium chloride  10-40 mL Intracatheter Q12H  . vancomycin  250 mg Oral 4 times per day  . vancomycin  1,000 mg Intravenous Once  . [START ON 04/04/2014] vancomycin  750 mg Intravenous Q24H   Assessment: Positive blood culture, gram positive cocci Vancomycin per pharmacy protocol Reduced renal function  Goal of Therapy:  Vancomycin trough level 15-20 mcg/ml  Plan:  Vancomycin 1 GM IV loading dose, then 750 mg IV every 24 hours Vancomycin trough level at steady state Monitor renal function Labs per protocol  Abner Greenspan, Aleyda Gindlesperger  Bennett 04/03/2014,2:51 PM

## 2014-04-03 NOTE — Consult Note (Addendum)
Consult requested by: Dr. Lorriane Shire Consult requested for respiratory failure:  HPI: This is an 80 year old who was admitted to the hospital with Clostridium difficile colitis and what appears to have been a ruptured diverticulum. She had been being treated with intravenous antibiotics and yesterday had cardiopulmonary arrest requiring intubation and mechanical ventilation. She appeared to be septic and in shock. She required pressor support. She had acute renal failure and had evidence of pulmonary edema on chest x-ray. She remains intubated. She is still on pressors although the dose of pressors has been able to be reduced somewhat. She is making more urine.  Past Medical History  Diagnosis Date  . Osteoarthritis   . Macular degeneration   . Hypertension   . Thyroid disease   . Thyroid cancer   . GI bleed   . Atrial fibrillation   . C. difficile colitis   . Diverticulitis   . Shingles   . Lupus      Family History  Problem Relation Age of Onset  . Tuberculosis Mother   . Thrombosis Father   . Deep vein thrombosis Sister   . Deep vein thrombosis Father      History   Social History  . Marital Status: Married    Spouse Name: N/A    Number of Children: 2  . Years of Education: Post HS   Occupational History  . 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 Smoking status: Never Smoker   . Smokeless tobacco: Never Used  . Alcohol Use: No  . Drug Use: No  . Sexual Activity: No   Other Topics Concern  . None   Social History Narrative   Lives with her husband, active.     ROS: Unobtainable    Objective: Vital signs in last 24 hours: Temp:  [96.4 F (35.8 C)-96.8 F (36 C)] 96.6 F (35.9 C) (11/27 0400) Pulse Rate:  [29-153] 104 (11/27 0745) Resp:  [15-32] 24 (11/27 0745) BP: (69-143)/(28-128) 128/49 mmHg (11/27 0745) SpO2:  [56 %-100 %] 100 % (11/27 0745) FiO2 (%):  [100 %] 100 % (11/27  0700) Weight:  [68.9 kg (151 lb 14.4 oz)] 68.9 kg (151 lb 14.4 oz) (11/27 0500) Weight change:  Last BM Date: 04/03/14  Intake/Output from previous day: 11/26 0701 - 11/27 0700 In: 7012.7 [I.V.:6012.7; IV Piggyback:1000] Out: 350 [Urine:350]  PHYSICAL EXAM She is intubated and on mechanical ventilation. She opens her eyes to verbal stimuli. She is moving all 4 extremities. Her pupils react. Her neck is supple. Her chest shows rales bilaterally. Her heart is irregular. Abdomen still mildly tender. She has no edema. Central nervous system exam shows that she is moving all 4 extremities.  Lab Results: Basic Metabolic Panel:  Recent Labs  04/02/14 1145  04/02/14 2059 04/03/14 0429  NA  --   < > 138 140  K  --   < > 5.9* 5.2  CL  --   < > 102 99  CO2  --   < > 6* 9*  GLUCOSE  --   < > 77 193*  BUN  --   < > 11 12  CREATININE  --   < > 1.50* 1.72*  CALCIUM  --   < > 7.1* 6.9*  MG 1.6  --   --   --   PHOS 5.0*  --   --   --   < > =  values in this interval not displayed. Liver Function Tests: No results for input(s): AST, ALT, ALKPHOS, BILITOT, PROT, ALBUMIN in the last 72 hours. No results for input(s): LIPASE, AMYLASE in the last 72 hours. No results for input(s): AMMONIA in the last 72 hours. CBC:  Recent Labs  04/02/14 0543 04/03/14 0429  WBC 10.5 47.8*  HGB 9.6* 10.4*  HCT 30.7* 34.9*  MCV 82.5 88.1  PLT 512* 140*   Cardiac Enzymes:  Recent Labs  04/02/14 1145 04/02/14 1820 04/03/14 0036  TROPONINI <0.30 0.60* 0.72*   BNP:  Recent Labs  04/02/14 1145  PROBNP 25794.0*   D-Dimer:  Recent Labs  04/02/14 1058  DDIMER 3.27*   CBG: No results for input(s): GLUCAP in the last 72 hours. Hemoglobin A1C: No results for input(s): HGBA1C in the last 72 hours. Fasting Lipid Panel: No results for input(s): CHOL, HDL, LDLCALC, TRIG, CHOLHDL, LDLDIRECT in the last 72 hours. Thyroid Function Tests: No results for input(s): TSH, T4TOTAL, FREET4, T3FREE,  THYROIDAB in the last 72 hours. Anemia Panel:  Recent Labs  04/01/14 1351  VITAMINB12 1112*  FOLATE >20.0  FERRITIN 240  TIBC 160*  IRON 14*  RETICCTPCT 1.0   Coagulation: No results for input(s): LABPROT, INR in the last 72 hours. Urine Drug Screen: Drugs of Abuse  No results found for: LABOPIA, COCAINSCRNUR, LABBENZ, AMPHETMU, THCU, LABBARB  Alcohol Level: No results for input(s): ETH in the last 72 hours. Urinalysis: No results for input(s): COLORURINE, LABSPEC, PHURINE, GLUCOSEU, HGBUR, BILIRUBINUR, KETONESUR, PROTEINUR, UROBILINOGEN, NITRITE, LEUKOCYTESUR in the last 72 hours.  Invalid input(s): APPERANCEUR Misc. Labs:   ABGS:  Recent Labs  04/03/14 0527  PHART 7.172*  PO2ART 89.4  TCO2 9.0  HCO3 9.2*     MICROBIOLOGY: Recent Results (from the past 240 hour(s))  Clostridium Difficile by PCR     Status: Abnormal   Collection Time: 03/12/2014 10:22 PM  Result Value Ref Range Status   C difficile by pcr POSITIVE (A) NEGATIVE Final    Comment: CRITICAL RESULT CALLED TO, READ BACK BY AND VERIFIED WITH:  HAMILTON,S @ 1914 ON 03/10/2014 BY WOODIE,J   Clostridium Difficile by PCR     Status: Abnormal   Collection Time: 04/01/14  1:00 PM  Result Value Ref Range Status   C difficile by pcr POSITIVE (A) NEGATIVE Final    Comment: CRITICAL RESULT CALLED TO, READ BACK BY AND VERIFIED WITH: SEIGLA,J. AT 1703 ON 04/01/2014 BY BAUGHAM,M.     Studies/Results: Dg Chest 1 View  04/02/2014   CLINICAL DATA:  Acute worsening shortness of breath  EXAM: CHEST - 1 VIEW  COMPARISON:  03/17/2014, 02/25/2014  FINDINGS: Cardiomegaly with worsening vascular and interstitial prominence compatible with edema. Effusions are evident for with small effusions bilaterally. Pattern compatible with CHF. There is associated bibasilar atelectasis/ collapse. No pneumothorax. Trachea is midline. Atherosclerosis of the aorta.  IMPRESSION: Cardiomegaly with new developing interstitial edema and small  effusions compatible with CHF  Bibasilar atelectasis.   Electronically Signed   By: Daryll Brod M.D.   On: 04/02/2014 11:02   Dg Chest 1v Repeat Same Day  04/02/2014   CLINICAL DATA:  Intubation.  Post intubation radiographs.  EXAM: CHEST - 1 VIEW SAME DAY  COMPARISON:  04/02/2014 at 1000 hr.  FINDINGS: Support apparatus: Interval intubation with the endotracheal tube tip 64 mm from the carina. Monitoring leads project over the chest. Defibrillator pads overlie the chest.  Cardiomediastinal Silhouette: Cardiomegaly. The cardiopericardial silhouette is better seen than on  the prior exam due to improved aeration.  Lungs: Improved lung volumes with worse moderate to severe CHF with bilateral pulmonary edema. No pneumothorax.  Effusions:  Probable small LEFT  Other:  None.  IMPRESSION: 1. Interval intubation. 2. Improved lung volumes with moderate to severe CHF which appears worse in the interval since the prior exam. Allowing for changes in pulmonary volumes, of the amount of pulmonary edema is greater.   Electronically Signed   By: Dereck Ligas M.D.   On: 04/02/2014 12:44   Dg Chest Port 1 View  04/03/2014   CLINICAL DATA:  Intubated.  Central line placement.  EXAM: PORTABLE CHEST - 1 VIEW  COMPARISON:  Yesterday.  FINDINGS: The endotracheal tube is in satisfactory position. No interval central venous catheter is seen. No pneumothorax. Stable enlarged cardiac silhouette and diffuse prominence of the pulmonary vasculature and interstitial markings. No significant change bilateral airspace opacity. Small right pleural effusion. Unremarkable bones.  IMPRESSION: 1. No central venous catheter is seen. 2. Stable changes of congestive heart failure.   Electronically Signed   By: Enrique Sack M.D.   On: 04/03/2014 07:24    Medications:  Prior to Admission:  Prescriptions prior to admission  Medication Sig Dispense Refill Last Dose  . amLODipine (NORVASC) 5 MG tablet Take 5 mg by mouth daily.   03/17/2014   . apixaban (ELIQUIS) 5 MG TABS tablet Take 1 tablet (5 mg total) by mouth 2 (two) times daily. 60 tablet 3 04/02/2014 at 11:30  . capsaicin (ZOSTRIX) 0.025 % cream Apply topically 2 (two) times daily. (Patient taking differently: Apply 1 application topically 2 (two) times daily as needed. ) 60 g 0 Past Month  . HYDROcodone-acetaminophen (NORCO/VICODIN) 5-325 MG per tablet Take 1 tablet by mouth every 5 (five) hours as needed (pain).   03/12/2014  . levothyroxine (SYNTHROID, LEVOTHROID) 100 MCG tablet Take 100 mcg by mouth daily. Do not take synthroid on Mon and Thursdays   03/23/2014  . lisinopril (PRINIVIL,ZESTRIL) 10 MG tablet Take 1 tablet (10 mg total) by mouth daily. 30 tablet 3 03/11/2014  . metoprolol succinate (TOPROL-XL) 25 MG 24 hr tablet Take 12.5 mg by mouth daily.   04/04/2014 at 1130  . ondansetron (ZOFRAN) 4 MG tablet Take 4 mg by mouth every 8 (eight) hours as needed for nausea or vomiting.   04/04/2014  . pregabalin (LYRICA) 50 MG capsule Take 50 mg by mouth 3 (three) times daily as needed (Pain).    03/27/2014  . Probiotic Product (ALIGN PO) Take 1 tablet by mouth daily.   Past Week  . ranitidine (ZANTAC) 150 MG capsule Take 150 mg by mouth 2 (two) times daily.   03/19/2014  . Red Yeast Rice Extract (RED YEAST RICE PO) Take 1 capsule by mouth daily.   03/27/2014   Scheduled: . antiseptic oral rinse  7 mL Mouth Rinse QID  . chlorhexidine  15 mL Mouth Rinse BID  . ciprofloxacin  400 mg Intravenous Q12H  . citalopram  20 mg Oral Daily  . ferrous sulfate  325 mg Oral Q breakfast  . hydrocortisone sod succinate (SOLU-CORTEF) inj  100 mg Intravenous Q6H  . levothyroxine  50 mcg Intravenous Once per day on Sun Tue Wed Fri Sat  . LORazepam  1 mg Oral QHS  . metoprolol tartrate  25 mg Oral BID  . metronidazole  500 mg Intravenous Q8H  . potassium chloride  40 mEq Oral Daily  . saccharomyces boulardii  250 mg Oral BID  .  sodium chloride  10-40 mL Intracatheter Q12H  . sodium  chloride  10-40 mL Intracatheter Q12H  . vancomycin  250 mg Oral 4 times per day   Continuous: . 0.9 % NaCl with KCl 40 mEq / L 50 mL/hr at 04/03/14 0600  . DOPamine 20 mcg/kg/min (04/03/14 0730)  . fentaNYL infusion INTRAVENOUS 75 mcg/hr (04/03/14 0730)  . heparin 1,300 Units/hr (04/03/14 0743)  . norepinephrine (LEVOPHED) Adult infusion 35 mcg/min (04/03/14 0804)  .  sodium bicarbonate  infusion 1000 mL 125 mL/hr at 04/03/14 0600   NLZ:JQBHALPFX, fentaNYL, fentaNYL, LORazepam, menthol-cetylpyridinium, metoprolol, midazolam, midazolam, morphine injection, ondansetron **OR** ondansetron (ZOFRAN) IV, promethazine, sodium chloride, sodium chloride  Assesment: She had cardiopulmonary arrest and I think is in septic shock from her probable diverticular rupture. She had been getting somewhat better. She has multiple other medical problems including chronic atrial fibrillation on chronic anticoagulation, severe protein calorie malnutrition and congestive heart failure. Chest x-ray shows evidence of volume overload. She has acute renal failure but is making urine now. She has elevated troponin which is probably from demand ischemia. She is still pretty intensely acidemic and may benefit from sodium bicarbonate drip. I discussed this with Dr. Lorriane Shire and low it is not visible on the list of orders she is already on sodium bicarbonate drip Active Problems:   Hypertension   Thyroid disease   Diverticulitis of colon   Atrial fibrillation   Weakness generalized   Protein-calorie malnutrition, severe   CHF (congestive heart failure)   Diverticulosis   Gastroenteritis   Diverticulitis   Diverticulitis of colon with perforation    Plan: Continue current treatments. She was not felt to be stable enough for transfer yesterday. I will add steroids. Continue other treatments. If she improves she could potentially be transferred later if her family still wants to do that    LOS: 6 days    Bryant Lipps L 04/03/2014, 8:09 AM

## 2014-04-03 NOTE — Progress Notes (Addendum)
[facility] for Heparin Indication: atrial fibrillation, bridge therapy.  Allergies  Allergen Reactions  . Thyroid Hormones Hives and Rash    proparathyroid thyroid medication   Patient Measurements: Height: 5\' 7"  (170.2 cm) Weight: 151 lb 14.4 oz (68.9 kg) IBW/kg (Calculated) : 61.6  Vital Signs: Temp: 97.2 F (36.2 C) (11/27 2000) Temp Source: Oral (11/27 2000) BP: 105/65 mmHg (11/27 2200) Pulse Rate: 95 (11/27 2200)  Labs:  Recent Labs  04/01/14 0522 04/02/14 0543 04/02/14 1145 04/02/14 1617 04/02/14 1820 04/02/14 2059 04/03/14 0036 04/03/14 0429 04/03/14 1407 04/03/14 2200  HGB 9.1* 9.6*  --   --   --   --   --  10.4* 10.3*  --   HCT 28.6* 30.7*  --   --   --   --   --  34.9* 32.5*  --   PLT 513* 512*  --   --   --   --   --  140* 119*  --   APTT 78*  --   --   --   --   --   --   --   --   --   HEPARINUNFRC 0.50 0.33  --   --   --   --   --  <0.10* 0.19* 0.18*  CREATININE 0.85 1.09  --  1.30*  --  1.50*  --  1.72*  --   --   TROPONINI  --   --  <0.30  --  0.60*  --  0.72*  --   --   --     Estimated Creatinine Clearance: 24.9 mL/min (by C-G formula based on Cr of 1.72).  Medical History: Past Medical History  Diagnosis Date  . Osteoarthritis   . Macular degeneration   . Essential hypertension   . Hypothyroidism   . Thyroid cancer   . GI bleed   . Atrial fibrillation   . C. difficile colitis   . Diverticulitis   . Shingles   . Lupus   . LBBB (left bundle branch block)   . Secondary cardiomyopathy     LVEF 40-45% February 2015   Medications:  Prescriptions prior to admission  Medication Sig Dispense Refill Last Dose  . amLODipine (NORVASC) 5 MG tablet Take 5 mg by mouth daily.   03/20/2014  . apixaban (ELIQUIS) 5 MG TABS tablet Take 1 tablet (5 mg total) by mouth 2 (two) times daily. 60 tablet 3 03/08/2014 at 11:30  . capsaicin (ZOSTRIX) 0.025 % cream Apply topically 2 (two) times daily. (Patient taking  differently: Apply 1 application topically 2 (two) times daily as needed. ) 60 g 0 Past Month  . HYDROcodone-acetaminophen (NORCO/VICODIN) 5-325 MG per tablet Take 1 tablet by mouth every 5 (five) hours as needed (pain).   03/27/2014  . levothyroxine (SYNTHROID, LEVOTHROID) 100 MCG tablet Take 100 mcg by mouth daily. Do not take synthroid on Mon and Thursdays   03/08/2014  . lisinopril (PRINIVIL,ZESTRIL) 10 MG tablet Take 1 tablet (10 mg total) by mouth daily. 30 tablet 3 03/10/2014  . metoprolol succinate (TOPROL-XL) 25 MG 24 hr tablet Take 12.5 mg by mouth daily.   04/02/2014 at 1130  . ondansetron (ZOFRAN) 4 MG tablet Take 4 mg by mouth every 8 (eight) hours as needed for nausea or vomiting.   03/16/2014  . pregabalin (LYRICA) 50 MG capsule Take 50 mg by mouth 3 (three) times daily as needed (Pain).    03/27/2014  . Probiotic  Product (ALIGN PO) Take 1 tablet by mouth daily.   Past Week  . ranitidine (ZANTAC) 150 MG capsule Take 150 mg by mouth 2 (two) times daily.   03/14/2014  . Red Yeast Rice Extract (RED YEAST RICE PO) Take 1 capsule by mouth daily.   03/11/2014   Assessment: 8 yoF admitted with diverticulitis with contained perforation.  She was on Apixaban @ home for Afib. She remains NPO due to severe nausea & vomiting so is currently being bridged with IV heparin.  Patient transferred to ICU after code, CPR Heparin level  subtherapeutic still sub therapeutic after rate increase and bolus  No evidence of overt GI bleeding, however platelets have dropped    Goal of Therapy:  Heparin level 0.3-0.7 Monitor platelets by anticoagulation protocol: Yes   Plan:  Increase heparin to 1650 units/hr Heparin level in 6 hours Daily heparin level & CBC while on  HIT panel has been ordered due to platelet level   Danielle [name], Danielle [name] 04/03/2014,10:50 PM

## 2014-04-04 LAB — PRO B NATRIURETIC PEPTIDE: Pro B Natriuretic peptide (BNP): 49144 pg/mL — ABNORMAL HIGH (ref 0–450)

## 2014-04-04 LAB — HEPATIC FUNCTION PANEL
ALBUMIN: 1.7 g/dL — AB (ref 3.5–5.2)
ALT: 1350 U/L — ABNORMAL HIGH (ref 0–35)
AST: 3293 U/L — ABNORMAL HIGH (ref 0–37)
Alkaline Phosphatase: 130 U/L — ABNORMAL HIGH (ref 39–117)
Bilirubin, Direct: 1.6 mg/dL — ABNORMAL HIGH (ref 0.0–0.3)
Indirect Bilirubin: 0.7 mg/dL (ref 0.3–0.9)
TOTAL PROTEIN: 3.7 g/dL — AB (ref 6.0–8.3)
Total Bilirubin: 2.3 mg/dL — ABNORMAL HIGH (ref 0.3–1.2)

## 2014-04-04 LAB — PROTIME-INR

## 2014-04-04 LAB — BASIC METABOLIC PANEL
Anion gap: 26 — ABNORMAL HIGH (ref 5–15)
BUN: 17 mg/dL (ref 6–23)
BUN: 22 mg/dL (ref 6–23)
CALCIUM: 6.1 mg/dL — AB (ref 8.4–10.5)
CHLORIDE: 93 meq/L — AB (ref 96–112)
CO2: 16 meq/L — AB (ref 19–32)
CO2: 17 mEq/L — ABNORMAL LOW (ref 19–32)
CREATININE: 2.61 mg/dL — AB (ref 0.50–1.10)
CREATININE: 3.15 mg/dL — AB (ref 0.50–1.10)
Calcium: 5.3 mg/dL — CL (ref 8.4–10.5)
Chloride: 90 mEq/L — ABNORMAL LOW (ref 96–112)
GFR calc Af Amer: 19 mL/min — ABNORMAL LOW (ref >90)
GFR calc Af Amer: 15 mL/min — ABNORMAL LOW (ref >90)
GFR calc non Af Amer: 16 mL/min — ABNORMAL LOW (ref >90)
GFR calc non Af Amer: 13 mL/min — ABNORMAL LOW (ref >90)
GLUCOSE: 79 mg/dL (ref 70–99)
GLUCOSE: 138 mg/dL — AB (ref 70–99)
Potassium: 5.3 mEq/L (ref 3.7–5.3)
Potassium: 5.3 mEq/L (ref 3.7–5.3)
Sodium: 136 mEq/L — ABNORMAL LOW (ref 137–147)
Sodium: 133 mEq/L — ABNORMAL LOW (ref 137–147)

## 2014-04-04 LAB — CBC
HCT: 33.4 % — ABNORMAL LOW (ref 36.0–46.0)
HEMATOCRIT: 31.9 % — AB (ref 36.0–46.0)
HEMOGLOBIN: 10.7 g/dL — AB (ref 12.0–15.0)
HEMOGLOBIN: 10.3 g/dL — AB (ref 12.0–15.0)
MCH: 26.4 pg (ref 26.0–34.0)
MCH: 26.8 pg (ref 26.0–34.0)
MCHC: 32 g/dL (ref 30.0–36.0)
MCHC: 32.3 g/dL (ref 30.0–36.0)
MCV: 82.3 fL (ref 78.0–100.0)
MCV: 83.1 fL (ref 78.0–100.0)
Platelets: 136 10*3/uL — ABNORMAL LOW (ref 150–400)
Platelets: 122 10*3/uL — ABNORMAL LOW (ref 150–400)
RBC: 4.06 MIL/uL (ref 3.87–5.11)
RBC: 3.84 MIL/uL — ABNORMAL LOW (ref 3.87–5.11)
RDW: 17 % — ABNORMAL HIGH (ref 11.5–15.5)
RDW: 17.1 % — ABNORMAL HIGH (ref 11.5–15.5)
WBC: 45.4 10*3/uL — ABNORMAL HIGH (ref 4.0–10.5)
WBC: 35.5 10*3/uL — ABNORMAL HIGH (ref 4.0–10.5)

## 2014-04-04 LAB — CULTURE, BLOOD (ROUTINE X 2)

## 2014-04-04 LAB — APTT
APTT: 163 s — AB (ref 24–37)
aPTT: 133 seconds — ABNORMAL HIGH (ref 24–37)
aPTT: 160 seconds — ABNORMAL HIGH (ref 24–37)
aPTT: 140 seconds — ABNORMAL HIGH (ref 24–37)
aPTT: 138 seconds — ABNORMAL HIGH (ref 24–37)

## 2014-04-04 LAB — PROCALCITONIN: Procalcitonin: 9.36 ng/mL

## 2014-04-04 LAB — LACTIC ACID, PLASMA
LACTIC ACID, VENOUS: 11.2 mmol/L — AB (ref 0.5–2.2)
LACTIC ACID, VENOUS: 12.8 mmol/L — AB (ref 0.5–2.2)

## 2014-04-04 LAB — PHOSPHORUS: Phosphorus: 6.1 mg/dL — ABNORMAL HIGH (ref 2.3–4.6)

## 2014-04-04 LAB — TROPONIN I
TROPONIN I: 0.4 ng/mL — AB (ref <0.30)
TROPONIN I: 0.38 ng/mL — AB (ref <0.30)

## 2014-04-04 LAB — HEPARIN LEVEL (UNFRACTIONATED): Heparin Unfractionated: 0.1 IU/mL — ABNORMAL LOW (ref 0.30–0.70)

## 2014-04-04 LAB — MAGNESIUM: MAGNESIUM: 1.3 mg/dL — AB (ref 1.5–2.5)

## 2014-04-04 MED ORDER — ARGATROBAN 50 MG/50ML IV SOLN
INTRAVENOUS | Status: AC
  Filled 2014-04-04: qty 50

## 2014-04-04 MED ORDER — VANCOMYCIN HCL IN DEXTROSE 1-5 GM/200ML-% IV SOLN
1000.0000 mg | INTRAVENOUS | Status: DC
  Filled 2014-04-04: qty 200

## 2014-04-04 MED ORDER — TRACE MINERALS CR-CU-F-FE-I-MN-MO-SE-ZN IV SOLN
INTRAVENOUS | Status: DC
  Administered 2014-04-04: 17:00:00 via INTRAVENOUS
  Filled 2014-04-04: qty 2000

## 2014-04-04 MED ORDER — CIPROFLOXACIN IN D5W 400 MG/200ML IV SOLN: 400.0000 mg | INTRAVENOUS | Status: DC

## 2014-04-04 MED ORDER — FUROSEMIDE 10 MG/ML IJ SOLN
8.0000 mg/h | INTRAVENOUS | Status: DC
  Administered 2014-04-04: 8 mg/h via INTRAVENOUS
  Filled 2014-04-04 (×2): qty 25

## 2014-04-04 MED ORDER — ARGATROBAN 50 MG/50ML IV SOLN
0.5000 ug/kg/min | INTRAVENOUS | Status: DC
  Administered 2014-04-04: 0.5 ug/kg/min via INTRAVENOUS
  Filled 2014-04-04: qty 50

## 2014-04-04 MED ORDER — ARGATROBAN 50 MG/50ML IV SOLN: 0.2500 ug/kg/min | INTRAVENOUS | Status: DC

## 2014-04-04 MED ORDER — FAT EMULSION 20 % IV EMUL
250.0000 mL | INTRAVENOUS | Status: DC
  Administered 2014-04-04: 250 mL via INTRAVENOUS
  Filled 2014-04-04: qty 250

## 2014-04-04 MED ORDER — SODIUM CHLORIDE 0.9 % IV BOLUS (SEPSIS)
1000.0000 mL | Freq: Once | INTRAVENOUS | Status: AC
  Administered 2014-04-04: 1000 mL via INTRAVENOUS

## 2014-04-04 MED ORDER — NOREPINEPHRINE BITARTRATE 1 MG/ML IV SOLN
INTRAVENOUS | Status: AC
  Filled 2014-04-04: qty 16

## 2014-04-04 MED ORDER — METOPROLOL TARTRATE 1 MG/ML IV SOLN
5.0000 mg | INTRAVENOUS | Status: AC
  Administered 2014-04-04: 5 mg via INTRAVENOUS

## 2014-04-04 MED ORDER — ARGATROBAN 50 MG/50ML IV SOLN: 0.1250 ug/kg/min | INTRAVENOUS | Status: DC

## 2014-04-04 NOTE — Progress Notes (Signed)
ANTICOAGULATION CONSULT NOTE -  Pharmacy Consult for Argabroban Indication: atrial fibrillation, bridge therapy  Allergies  Allergen Reactions  . Thyroid Hormones Hives and Rash    proparathyroid thyroid medication    Patient Measurements: Height: 5\' 7"  (170.2 cm) Weight: 164 lb 7.4 oz (74.6 kg) IBW/kg (Calculated) : 61.6 Heparin Dosing Weight:   Vital Signs: Temp: 97 F (36.1 C) (11/28 2000) Temp Source: Axillary (11/28 2000) BP: 82/37 mmHg (11/28 2315) Pulse Rate: 75 (11/28 2315)  Labs:  Recent Labs  04/03/14 0036 04/03/14 0429 04/03/14 1407 04/03/14 2200 04/04/14 0505  04/04/14 1635 04/04/14 1947 04/04/14 2059 04/04/14 2215  HGB  --  10.4* 10.3*  --  10.7*  --   --  10.3*  --   --   HCT  --  34.9* 32.5*  --  33.4*  --   --  31.9*  --   --   PLT  --  140* 119*  --  136*  --   --  122*  --   --   APTT  --   --   --   --  133*  < > 163*  --  140* 138*  LABPROT  --   --   --   --   --   --   --  >90.0*  --   --   INR  --   --   --   --   --   --   --  >10.00*  --   --   HEPARINUNFRC  --  <0.10* 0.19* 0.18* 0.10*  --   --   --   --   --   CREATININE  --  1.72*  --   --  2.61*  --   --  3.15*  --   --   TROPONINI 0.72*  --   --   --  0.40*  --   --  0.38*  --   --   < > = values in this interval not displayed.  Estimated Creatinine Clearance: 14.8 mL/min (by C-G formula based on Cr of 3.15).   Medical History: Past Medical History  Diagnosis Date  . Osteoarthritis   . Macular degeneration   . Essential hypertension   . Hypothyroidism   . Thyroid cancer   . GI bleed   . Atrial fibrillation   . C. difficile colitis   . Diverticulitis   . Shingles   . Lupus   . LBBB (left bundle branch block)   . Secondary cardiomyopathy     LVEF 40-45% February 2015    Medications:  Scheduled:  . antiseptic oral rinse  7 mL Mouth Rinse QID  . chlorhexidine  15 mL Mouth Rinse BID  . [START ON 04/19/14] ciprofloxacin  400 mg Intravenous Q24H  . citalopram  20 mg  Oral Daily  . ferrous sulfate  325 mg Oral Q breakfast  . hydrocortisone sod succinate (SOLU-CORTEF) inj  100 mg Intravenous Q6H  . levothyroxine  50 mcg Intravenous Once per day on Sun Tue Wed Fri Sat  . LORazepam  1 mg Oral QHS  . metronidazole  500 mg Intravenous Q8H  . pantoprazole (PROTONIX) IV  40 mg Intravenous Q24H  . saccharomyces boulardii  250 mg Oral BID  . sodium chloride  10-40 mL Intracatheter Q12H  . sodium chloride  10-40 mL Intracatheter Q12H  . vancomycin  250 mg Oral 4 times per day  . [START ON April 19, 2014] vancomycin  1,000  mg Intravenous Q48H    Assessment: 39 yoF admitted with diverticulitis with contained perforation. She was on Apixaban @ home for Afib. She remains NPO due to severe nausea & vomiting so is currently being bridged with IV heparin.  Patient transferred to ICU after code, CPR Heparin level subtherapeutic still sub therapeutic after several rate increase and bolus  No evidence of overt GI bleeding, however platelets have dropped from 512 (04/02/14) to 136 (04/04/14). HIT panel has been ordered. Possibility platelet decrease due to sepsis. Baseline aPTT obtained. Heparin discontinued, Argatroban protocol initiated. APTT above goal after being held since 1730 after previous APTT elevated after rate reduction   Goal of Therapy:  aPTT 50-90 seconds Monitor platelets by anticoagulation protocol: Yes   Plan:  Continue to hold argatroban infusion. Repeat APTT at 2 AM 2014/04/27. Argatroban will be restarted when APPT within goal range APTT 2 hours after restarting  Argatroban and every 2 hours until 2 therapeutic levels CBC and aPTT daily   Abner Greenspan, Yesha Muchow Bennett 04/04/2014,11:56 PM

## 2014-04-04 NOTE — Progress Notes (Signed)
ANTICOAGULATION CONSULT NOTE -  Pharmacy Consult for Argabroban Indication: atrial fibrillation, bridge therapy  Allergies  Allergen Reactions  . Thyroid Hormones Hives and Rash    proparathyroid thyroid medication    Patient Measurements: Height: 5\' 7"  (170.2 cm) Weight: 164 lb 7.4 oz (74.6 kg) IBW/kg (Calculated) : 61.6 Heparin Dosing Weight:   Vital Signs: Temp: 97 F (36.1 C) (11/28 2000) Temp Source: Axillary (11/28 2000) BP: 46/32 mmHg (11/28 2000) Pulse Rate: 31 (11/28 2000)  Labs:  Recent Labs  04/03/14 0036 04/03/14 0429 04/03/14 1407 04/03/14 2200  04/04/14 0505 04/04/14 1303 04/04/14 1635 04/04/14 1947 04/04/14 2059  HGB  --  10.4* 10.3*  --   --  10.7*  --   --  10.3*  --   HCT  --  34.9* 32.5*  --   --  33.4*  --   --  31.9*  --   PLT  --  140* 119*  --   --  136*  --   --  122*  --   APTT  --   --   --   --   < > 133* 160* 163*  --  140*  LABPROT  --   --   --   --   --   --   --   --  >90.0*  --   INR  --   --   --   --   --   --   --   --  >10.00*  --   HEPARINUNFRC  --  <0.10* 0.19* 0.18*  --  0.10*  --   --   --   --   CREATININE  --  1.72*  --   --   --  2.61*  --   --  3.15*  --   TROPONINI 0.72*  --   --   --   --  0.40*  --   --  0.38*  --   < > = values in this interval not displayed.  Estimated Creatinine Clearance: 14.8 mL/min (by C-G formula based on Cr of 3.15).   Medical History: Past Medical History  Diagnosis Date  . Osteoarthritis   . Macular degeneration   . Essential hypertension   . Hypothyroidism   . Thyroid cancer   . GI bleed   . Atrial fibrillation   . C. difficile colitis   . Diverticulitis   . Shingles   . Lupus   . LBBB (left bundle branch block)   . Secondary cardiomyopathy     LVEF 40-45% February 2015    Medications:  Scheduled:  . antiseptic oral rinse  7 mL Mouth Rinse QID  . chlorhexidine  15 mL Mouth Rinse BID  . [START ON 2014/04/25] ciprofloxacin  400 mg Intravenous Q24H  . citalopram  20 mg  Oral Daily  . ferrous sulfate  325 mg Oral Q breakfast  . hydrocortisone sod succinate (SOLU-CORTEF) inj  100 mg Intravenous Q6H  . levothyroxine  50 mcg Intravenous Once per day on Sun Tue Wed Fri Sat  . LORazepam  1 mg Oral QHS  . metronidazole  500 mg Intravenous Q8H  . pantoprazole (PROTONIX) IV  40 mg Intravenous Q24H  . saccharomyces boulardii  250 mg Oral BID  . sodium chloride  10-40 mL Intracatheter Q12H  . sodium chloride  10-40 mL Intracatheter Q12H  . vancomycin  250 mg Oral 4 times per day  . [START ON 2014-04-25] vancomycin  1,000  mg Intravenous Q48H    Assessment: 42 yoF admitted with diverticulitis with contained perforation. She was on Apixaban @ home for Afib. She remains NPO due to severe nausea & vomiting so is currently being bridged with IV heparin.  Patient transferred to ICU after code, CPR Heparin level subtherapeutic still sub therapeutic after several rate increase and bolus  No evidence of overt GI bleeding, however platelets have dropped from 512 (04/02/14) to 136 (04/04/14). HIT panel has been ordered. Possibility platelet decrease due to sepsis. Baseline aPTT obtained. Heparin discontinued, Argatroban protocol initiated. APTT above goal after being held since 1730 after previous APTT elevated after rate reduction   Goal of Therapy:  aPTT 50-90 seconds Monitor platelets by anticoagulation protocol: Yes   Plan:  Continue to hold argatroban infusion. Repeat APTT at 2200. Argatroban will be restarted when APPT within goal range APTT 2 hours after restarting  Argatroban and every 2 hours until 2 therapeutic levels CBC and aPTT daily   Abner Greenspan, Eaton Folmar Bennett 04/04/2014,9:22 PM

## 2014-04-04 NOTE — Progress Notes (Signed)
RN calling elink  Patient now ihypotensive and tachycardic  Plan Dc dopamine Dc lasix gtt Start levophed gtt Fluid bous Stat abg Send stat labs - lactate, trop, bmet, cbc, inr, lft   - case reviewed - very poor prognosis - if does not respond to above measures then she is actively dying   Dr. Brand Males, M.D., [facility].C.P Pulmonary and Critical Care Medicine Staff Physician [facility] Pager: [phone], If no answer or between  15:00h - 7:00h: call [phone]  04/04/2014 7:35 PM

## 2014-04-04 NOTE — Progress Notes (Signed)
ANTICOAGULATION CONSULT NOTE -  Pharmacy Consult for Argabroban Indication: atrial fibrillation, bridge therapy  Allergies  Allergen Reactions  . Thyroid Hormones Hives and Rash    proparathyroid thyroid medication    Patient Measurements: Height: 5\' 7"  (170.2 cm) Weight: 164 lb 7.4 oz (74.6 kg) IBW/kg (Calculated) : 61.6 Heparin Dosing Weight:   Vital Signs: Temp: 97.7 F (36.5 C) (11/28 0530) Temp Source: Axillary (11/28 0530) BP: 83/55 mmHg (11/28 1330) Pulse Rate: 91 (11/28 1330)  Labs:  Recent Labs  04/02/14 1820 04/02/14 2059 04/03/14 0036 04/03/14 0429 04/03/14 1407 04/03/14 2200 04/04/14 0505 04/04/14 1303  HGB  --   --   --  10.4* 10.3*  --  10.7*  --   HCT  --   --   --  34.9* 32.5*  --  33.4*  --   PLT  --   --   --  140* 119*  --  136*  --   APTT  --   --   --   --   --   --  133* 160*  HEPARINUNFRC  --   --   --  <0.10* 0.19* 0.18* 0.10*  --   CREATININE  --  1.50*  --  1.72*  --   --  2.61*  --   TROPONINI 0.60*  --  0.72*  --   --   --  0.40*  --     Estimated Creatinine Clearance: 17.8 mL/min (by C-G formula based on Cr of 2.61).   Medical History: Past Medical History  Diagnosis Date  . Osteoarthritis   . Macular degeneration   . Essential hypertension   . Hypothyroidism   . Thyroid cancer   . GI bleed   . Atrial fibrillation   . C. difficile colitis   . Diverticulitis   . Shingles   . Lupus   . LBBB (left bundle branch block)   . Secondary cardiomyopathy     LVEF 40-45% February 2015    Medications:  Scheduled:  . antiseptic oral rinse  7 mL Mouth Rinse QID  . chlorhexidine  15 mL Mouth Rinse BID  . [START ON 04-21-14] ciprofloxacin  400 mg Intravenous Q24H  . citalopram  20 mg Oral Daily  . ferrous sulfate  325 mg Oral Q breakfast  . hydrocortisone sod succinate (SOLU-CORTEF) inj  100 mg Intravenous Q6H  . levothyroxine  50 mcg Intravenous Once per day on Sun Tue Wed Fri Sat  . LORazepam  1 mg Oral QHS  . metronidazole   500 mg Intravenous Q8H  . pantoprazole (PROTONIX) IV  40 mg Intravenous Q24H  . saccharomyces boulardii  250 mg Oral BID  . sodium chloride  10-40 mL Intracatheter Q12H  . sodium chloride  10-40 mL Intracatheter Q12H  . vancomycin  250 mg Oral 4 times per day  . [START ON April 21, 2014] vancomycin  1,000 mg Intravenous Q48H    Assessment: 31 yoF admitted with diverticulitis with contained perforation. She was on Apixaban @ home for Afib. She remains NPO due to severe nausea & vomiting so is currently being bridged with IV heparin.  Patient transferred to ICU after code, CPR Heparin level subtherapeutic still sub therapeutic after several rate increase and bolus  No evidence of overt GI bleeding, however platelets have dropped from 512 (04/02/14) to 136 (04/04/14). HIT panel has been ordered. Possibility platelet decrease due to sepsis. Baseline aPTT obtained. Heparin discontinued, Argatroban protocol initiated. APTT above goal   Goal of  Therapy:  aPTT 50-90 seconds Monitor platelets by anticoagulation protocol: Yes   Plan:  Hold Argatroban for 30 minutes , then change infusion to 0.25 mcg/kg/min (50% of original rate) APTT 2 hours after restarting Argatroban and every 2 hours until 2 therapeutic levels CBC and aPTT daily   Abner Greenspan, Weronika Birch Bennett 04/04/2014,2:00 PM

## 2014-04-04 NOTE — Progress Notes (Signed)
ANTICOAGULATION CONSULT NOTE - Initial Consult  Pharmacy Consult for Argabroban Indication: atrial fibrillation, bridge therapy  Allergies  Allergen Reactions  . Thyroid Hormones Hives and Rash    proparathyroid thyroid medication    Patient Measurements: Height: 5\' 7"  (170.2 cm) Weight: 164 lb 7.4 oz (74.6 kg) IBW/kg (Calculated) : 61.6 Heparin Dosing Weight:   Vital Signs: Temp: 97.7 F (36.5 C) (11/28 0530) Temp Source: Axillary (11/28 0530) BP: 100/64 mmHg (11/28 0800) Pulse Rate: 95 (11/28 0800)  Labs:  Recent Labs  04/02/14 1820 04/02/14 2059 04/03/14 0036 04/03/14 0429 04/03/14 1407 04/03/14 2200 04/04/14 0505  HGB  --   --   --  10.4* 10.3*  --  10.7*  HCT  --   --   --  34.9* 32.5*  --  33.4*  PLT  --   --   --  140* 119*  --  136*  APTT  --   --   --   --   --   --  133*  HEPARINUNFRC  --   --   --  <0.10* 0.19* 0.18* 0.10*  CREATININE  --  1.50*  --  1.72*  --   --  2.61*  TROPONINI 0.60*  --  0.72*  --   --   --  0.40*    Estimated Creatinine Clearance: 17.8 mL/min (by C-G formula based on Cr of 2.61).   Medical History: Past Medical History  Diagnosis Date  . Osteoarthritis   . Macular degeneration   . Essential hypertension   . Hypothyroidism   . Thyroid cancer   . GI bleed   . Atrial fibrillation   . C. difficile colitis   . Diverticulitis   . Shingles   . Lupus   . LBBB (left bundle branch block)   . Secondary cardiomyopathy     LVEF 40-45% February 2015    Medications:  Scheduled:  . antiseptic oral rinse  7 mL Mouth Rinse QID  . chlorhexidine  15 mL Mouth Rinse BID  . ciprofloxacin  400 mg Intravenous Q12H  . citalopram  20 mg Oral Daily  . ferrous sulfate  325 mg Oral Q breakfast  . hydrocortisone sod succinate (SOLU-CORTEF) inj  100 mg Intravenous Q6H  . levothyroxine  50 mcg Intravenous Once per day on Sun Tue Wed Fri Sat  . LORazepam  1 mg Oral QHS  . metronidazole  500 mg Intravenous Q8H  . pantoprazole (PROTONIX) IV   40 mg Intravenous Q24H  . saccharomyces boulardii  250 mg Oral BID  . sodium chloride  10-40 mL Intracatheter Q12H  . sodium chloride  10-40 mL Intracatheter Q12H  . vancomycin  250 mg Oral 4 times per day  . vancomycin  750 mg Intravenous Q24H    Assessment: 71 yoF admitted with diverticulitis with contained perforation. She was on Apixaban @ home for Afib. She remains NPO due to severe nausea & vomiting so is currently being bridged with IV heparin.  Patient transferred to ICU after code, CPR Heparin level subtherapeutic still sub therapeutic after several rate increase and bolus  No evidence of overt GI bleeding, however platelets have dropped from 512 (04/02/14) to 136 (04/04/14). HIT panel has been ordered. Possibility platelet decrease due to sepsis. Baseline aPTT obtained. Heparin discontinued, Argatroban protocol initiated.   Goal of Therapy:  aPTT 50-90 seconds Monitor platelets by anticoagulation protocol: Yes   Plan:  Argatroban started at 0.5 mcg/kg/min infusion APTT 2 hours after starting Argatroban and  every 2 hours until 2 therapeutic levels CBC and aPTT daily   Abner Greenspan, Lilyan Prete Bennett 04/04/2014,9:40 AM

## 2014-04-04 NOTE — Progress Notes (Signed)
Subjective: She remains intubated and on the ventilator. She is responsive when her sedation is reduced. She is off Levaquin and dopamine has been turned down. One blood culture is positive for gram-positive cocci but no further identification yet.  Objective: Vital signs in last 24 hours: Temp:  [97.2 F (36.2 C)-98.9 F (37.2 C)] 97.7 F (36.5 C) (11/28 0530) Pulse Rate:  [86-102] 95 (11/28 0800) Resp:  [18-26] 21 (11/28 0800) BP: (68-131)/(42-86) 100/64 mmHg (11/28 0800) SpO2:  [86 %-99 %] 94 % (11/28 0800) FiO2 (%):  [60 %-100 %] 60 % (11/28 0701) Weight:  [74.6 kg (164 lb 7.4 oz)] 74.6 kg (164 lb 7.4 oz) (11/28 0530) Weight change: 5.7 kg (12 lb 9.1 oz) Last BM Date: 04/03/14  Intake/Output from previous day: 11/27 0701 - 11/28 0700 In: 5465 [I.V.:4325; NG/GT:240; IV Piggyback:900] Out: 1175 [Urine:775; Emesis/NG output:400]  PHYSICAL EXAM General appearance: Intubated, sedated but responsive when her sedation is reduced Resp: rales bilaterally Cardio: irregularly irregular rhythm GI: Still seems mildly tender Extremities: She is showing more third spacing of fluid  Lab Results:  Results for orders placed or performed during the hospital encounter of 03/16/2014 (from the past 48 hour(s))  D-dimer, quantitative     Status: Abnormal   Collection Time: 04/02/14 10:58 AM  Result Value Ref Range   D-Dimer, Quant 3.27 (H) 0.00 - 0.48 ug/mL-FEU    Comment:        AT THE INHOUSE ESTABLISHED CUTOFF VALUE OF 0.48 ug/mL FEU, THIS ASSAY HAS BEEN DOCUMENTED IN THE LITERATURE TO HAVE A SENSITIVITY AND NEGATIVE PREDICTIVE VALUE OF AT LEAST 98 TO 99%.  THE TEST RESULT SHOULD BE CORRELATED WITH AN ASSESSMENT OF THE CLINICAL PROBABILITY OF DVT / VTE.   Blood gas, arterial     Status: Abnormal   Collection Time: 04/02/14 11:40 AM  Result Value Ref Range   O2 Content 5.0 L/min   Delivery systems NASAL CANNULA    pH, Arterial 7.145 (LL) 7.350 - 7.450    Comment: CRITICAL RESULT  CALLED TO, READ BACK BY AND VERIFIED WITH: GRAY,M.RN 04/02/14 AT 1150 BY BROADNAX,L.RRT    pCO2 arterial 33.0 (L) 35.0 - 45.0 mmHg   pO2, Arterial 73.7 (L) 80.0 - 100.0 mmHg   Bicarbonate 10.9 (L) 20.0 - 24.0 mEq/L   TCO2 10.8 0 - 100 mmol/L   Acid-base deficit 16.3 (H) 0.0 - 2.0 mmol/L   O2 Saturation 87.2 %   Patient temperature 37.0    Collection site LEFT RADIAL    Drawn by [id number]    Sample type ARTERIAL    Allens test (pass/fail) PASS PASS  Troponin I (q 6hr x 3)     Status: None   Collection Time: 04/02/14 11:45 AM  Result Value Ref Range   Troponin I <0.30 <0.30 ng/mL    Comment:        Due to the release kinetics of cTnI, a negative result within the first hours of the onset of symptoms does not rule out myocardial infarction with certainty. If myocardial infarction is still suspected, repeat the test at appropriate intervals.   Phosphorus     Status: Abnormal   Collection Time: 04/02/14 11:45 AM  Result Value Ref Range   Phosphorus 5.0 (H) 2.3 - 4.6 mg/dL  Magnesium     Status: None   Collection Time: 04/02/14 11:45 AM  Result Value Ref Range   Magnesium 1.6 1.5 - 2.5 mg/dL  Pro b natriuretic peptide     Status: Abnormal  Collection Time: 04/02/14 11:45 AM  Result Value Ref Range   Pro B Natriuretic peptide (BNP) 25794.0 (H) 0 - 450 pg/mL  Blood gas, arterial     Status: Abnormal   Collection Time: 04/02/14  3:10 PM  Result Value Ref Range   FIO2 100.00 %   Delivery systems VENTILATOR    Mode PRESSURE REGULATED VOLUME CONTROL    VT 530 mL   Rate 15 resp/min   Peep/cpap 0 cm H20   pH, Arterial 6.996 (LL) 7.350 - 7.450    Comment: CRITICAL RESULT CALLED TO, READ BACK BY AND VERIFIED WITH: GRAY,M.RN 04/02/14 AT 1528 BY BROADNAX,L.RRT    pCO2 arterial 34.6 (L) 35.0 - 45.0 mmHg   pO2, Arterial 110.0 (H) 80.0 - 100.0 mmHg   Bicarbonate 8.0 (L) 20.0 - 24.0 mEq/L   TCO2 8.3 0 - 100 mmol/L   Acid-base deficit 21.0 (H) 0.0 - 2.0 mmol/L   O2 Saturation 92.8 %    Patient temperature 37.0    Collection site LEFT RADIAL    Drawn by [id number]    Sample type ARTERIAL    Allens test (pass/fail) PASS PASS  Basic metabolic panel     Status: Abnormal   Collection Time: 04/02/14  4:17 PM  Result Value Ref Range   Sodium 141 137 - 147 mEq/L   Potassium 5.5 (H) 3.7 - 5.3 mEq/L    Comment: DELTA CHECK NOTED   Chloride 105 96 - 112 mEq/L   CO2 9 (LL) 19 - 32 mEq/L    Comment: RESULT REPEATED AND VERIFIED CRITICAL RESULT CALLED TO, READ BACK BY AND VERIFIED WITH: HILTON,L ON 04/02/2014 AT 1715 BY ISLEY,B    Glucose, Bld 67 (L) 70 - 99 mg/dL   BUN 10 6 - 23 mg/dL   Creatinine, Ser 1.30 (H) 0.50 - 1.10 mg/dL   Calcium 7.3 (L) 8.4 - 10.5 mg/dL   GFR calc non Af Amer 37 (L) >90 mL/min   GFR calc Af Amer 43 (L) >90 mL/min    Comment: (NOTE) The eGFR has been calculated using the CKD EPI equation. This calculation has not been validated in all clinical situations. eGFR's persistently <90 mL/min signify possible Chronic Kidney Disease.    Anion gap 27 (H) 5 - 15  Troponin I (q 6hr x 3)     Status: Abnormal   Collection Time: 04/02/14  6:20 PM  Result Value Ref Range   Troponin I 0.60 (HH) <0.30 ng/mL    Comment:        Due to the release kinetics of cTnI, a negative result within the first hours of the onset of symptoms does not rule out myocardial infarction with certainty. If myocardial infarction is still suspected, repeat the test at appropriate intervals. RESULT REPEATED AND VERIFIED CRITICAL RESULT CALLED TO, READ BACK BY AND VERIFIED WITH: HAMMOCK,J AT 1905 ON 04/02/2014 BY ISLEY,B   Culture, blood (routine x 2)     Status: None (Preliminary result)   Collection Time: 04/02/14  6:20 PM  Result Value Ref Range   Specimen Description BLOOD LEFT ARM    Special Requests BOTTLES DRAWN AEROBIC ONLY 4CC    Culture  Setup Time      04/03/2014 17:10 Performed at [facility] Gram Stain Report Called  to,Read Back By and Verified With: [facility] AT 4196 BY HUFFINES,S ON 04/03/14    Report Status PENDING   Lactic acid, plasma  Status: Abnormal   Collection Time: 04/02/14  6:20 PM  Result Value Ref Range   Lactic Acid, Venous 14.3 (H) 0.5 - 2.2 mmol/L  Procalcitonin - Baseline     Status: None   Collection Time: 04/02/14  6:20 PM  Result Value Ref Range   Procalcitonin 1.22 ng/mL    Comment:        Interpretation: PCT > 0.5 ng/mL and <= 2 ng/mL: Systemic infection (sepsis) is possible, but other conditions are known to elevate PCT as well. (NOTE)         ICU PCT Algorithm               Non ICU PCT Algorithm    ----------------------------     ------------------------------         PCT < 0.25 ng/mL                 PCT < 0.1 ng/mL     Stopping of antibiotics            Stopping of antibiotics       strongly encouraged.               strongly encouraged.    ----------------------------     ------------------------------       PCT level decrease by               PCT < 0.25 ng/mL       >= 80% from peak PCT       OR PCT 0.25 - 0.5 ng/mL          Stopping of antibiotics                                             encouraged.     Stopping of antibiotics           encouraged.    ----------------------------     ------------------------------       PCT level decrease by              PCT >= 0.25 ng/mL       < 80% from peak PCT        AND PCT >= 0.5 ng/mL             Continuing antibiotics                                              encouraged.       Continuing antibiotics            encouraged.    ----------------------------     ------------------------------     PCT level increase compared          PCT > 0.5 ng/mL         with peak PCT AND          PCT >= 0.5 ng/mL             Escalation of antibiotics                                          strongly encouraged.      Escalation of antibiotics  strongly encouraged.   Culture, blood (routine x 2)     Status: None  (Preliminary result)   Collection Time: 04/02/14  6:25 PM  Result Value Ref Range   Specimen Description PORTA CATH    Special Requests BOTTLES DRAWN AEROBIC AND ANAEROBIC 6CC    Culture NO GROWTH 2 DAYS    Report Status PENDING   Basic metabolic panel     Status: Abnormal   Collection Time: 04/02/14  8:59 PM  Result Value Ref Range   Sodium 138 137 - 147 mEq/L   Potassium 5.9 (H) 3.7 - 5.3 mEq/L   Chloride 102 96 - 112 mEq/L   CO2 6 (LL) 19 - 32 mEq/L    Comment: RESULT REPEATED AND VERIFIED CRITICAL RESULT CALLED TO, READ BACK BY AND VERIFIED WITH: HAMMOCK,S AT 2145 ON 04/02/2014 BY ISLEY,B    Glucose, Bld 77 70 - 99 mg/dL   BUN 11 6 - 23 mg/dL   Creatinine, Ser 1.50 (H) 0.50 - 1.10 mg/dL   Calcium 7.1 (L) 8.4 - 10.5 mg/dL   GFR calc non Af Amer 31 (L) >90 mL/min   GFR calc Af Amer 36 (L) >90 mL/min    Comment: (NOTE) The eGFR has been calculated using the CKD EPI equation. This calculation has not been validated in all clinical situations. eGFR's persistently <90 mL/min signify possible Chronic Kidney Disease.    Anion gap 30 (H) 5 - 15  Blood gas, arterial     Status: Abnormal   Collection Time: 04/02/14  9:56 PM  Result Value Ref Range   FIO2 100.00 %   Delivery systems VENTILATOR    Mode PRESSURE REGULATED VOLUME CONTROL    VT 550 mL   Rate 18 resp/min   pH, Arterial 6.963 (LL) 7.350 - 7.450    Comment: CRITICAL RESULT CALLED TO, READ BACK BY AND VERIFIED WITH: SHERRI HAMMOCK,RN BY K KNICK,RRT RCP ON 04/02/2014 AT 2204    pCO2 arterial 32.1 (L) 35.0 - 45.0 mmHg   pO2, Arterial 79.1 (L) 80.0 - 100.0 mmHg   Bicarbonate 6.9 (L) 20.0 - 24.0 mEq/L   TCO2 7.3 0 - 100 mmol/L   Acid-base deficit 22.5 (H) 0.0 - 2.0 mmol/L   O2 Saturation 86.2 %   Patient temperature 37.0    Collection site LEFT RADIAL    Drawn by [id number]    Sample type ARTERIAL    Allens test (pass/fail) PASS PASS  Troponin I (q 6hr x 3)     Status: Abnormal   Collection Time: 04/03/14 12:36 AM   Result Value Ref Range   Troponin I 0.72 (HH) <0.30 ng/mL    Comment:        Due to the release kinetics of cTnI, a negative result within the first hours of the onset of symptoms does not rule out myocardial infarction with certainty. If myocardial infarction is still suspected, repeat the test at appropriate intervals. CRITICAL VALUE NOTED.  VALUE IS CONSISTENT WITH PREVIOUSLY REPORTED AND CALLED VALUE.   CBC     Status: Abnormal   Collection Time: 04/03/14  4:29 AM  Result Value Ref Range   WBC 47.8 (H) 4.0 - 10.5 K/uL    Comment: RESULT REPEATED AND VERIFIED WHITE COUNT CONFIRMED ON SMEAR    RBC 3.96 3.87 - 5.11 MIL/uL   Hemoglobin 10.4 (L) 12.0 - 15.0 g/dL   HCT 34.9 (L) 36.0 - 46.0 %   MCV 88.1 78.0 - 100.0 fL   MCH 26.3 26.0 -  34.0 pg   MCHC 29.8 (L) 30.0 - 36.0 g/dL   RDW 17.0 (H) 11.5 - 15.5 %   Platelets 140 (L) 150 - 400 K/uL    Comment: RESULT REPEATED AND VERIFIED SPECIMEN CHECKED FOR CLOTS PLATELETS APPEAR ADEQUATE SMEAR STAINED AND AVAILABLE FOR REVIEW DELTA CHECK NOTED LARGE PLATELETS PRESENT   Heparin level (unfractionated)     Status: Abnormal   Collection Time: 04/03/14  4:29 AM  Result Value Ref Range   Heparin Unfractionated <0.10 (L) 0.30 - 0.70 IU/mL    Comment:        IF HEPARIN RESULTS ARE BELOW EXPECTED VALUES, AND PATIENT DOSAGE HAS BEEN CONFIRMED, SUGGEST FOLLOW UP TESTING OF ANTITHROMBIN III LEVELS.   Basic metabolic panel     Status: Abnormal   Collection Time: 04/03/14  4:29 AM  Result Value Ref Range   Sodium 140 137 - 147 mEq/L   Potassium 5.2 3.7 - 5.3 mEq/L   Chloride 99 96 - 112 mEq/L   CO2 9 (LL) 19 - 32 mEq/L    Comment: CRITICAL RESULT CALLED TO, READ BACK BY AND VERIFIED WITH: LEE,B AT 5:35AM ON 04/03/14 BY FESTERMAN,C    Glucose, Bld 193 (H) 70 - 99 mg/dL   BUN 12 6 - 23 mg/dL   Creatinine, Ser 1.72 (H) 0.50 - 1.10 mg/dL   Calcium 6.9 (L) 8.4 - 10.5 mg/dL   GFR calc non Af Amer 27 (L) >90 mL/min   GFR calc Af Amer  31 (L) >90 mL/min    Comment: (NOTE) The eGFR has been calculated using the CKD EPI equation. This calculation has not been validated in all clinical situations. eGFR's persistently <90 mL/min signify possible Chronic Kidney Disease.   Procalcitonin     Status: None   Collection Time: 04/03/14  4:29 AM  Result Value Ref Range   Procalcitonin 7.08 ng/mL    Comment:        Interpretation: PCT > 2 ng/mL: Systemic infection (sepsis) is likely, unless other causes are known. (NOTE)         ICU PCT Algorithm               Non ICU PCT Algorithm    ----------------------------     ------------------------------         PCT < 0.25 ng/mL                 PCT < 0.1 ng/mL     Stopping of antibiotics            Stopping of antibiotics       strongly encouraged.               strongly encouraged.    ----------------------------     ------------------------------       PCT level decrease by               PCT < 0.25 ng/mL       >= 80% from peak PCT       OR PCT 0.25 - 0.5 ng/mL          Stopping of antibiotics                                             encouraged.     Stopping of antibiotics           encouraged.    ----------------------------     ------------------------------  PCT level decrease by              PCT >= 0.25 ng/mL       < 80% from peak PCT        AND PCT >= 0.5 ng/mL            Continuing antibiotics                                               encouraged.       Continuing antibiotics            encouraged.    ----------------------------     ------------------------------     PCT level increase compared          PCT > 0.5 ng/mL         with peak PCT AND          PCT >= 0.5 ng/mL             Escalation of antibiotics                                          strongly encouraged.      Escalation of antibiotics        strongly encouraged.   Blood gas, arterial     Status: Abnormal   Collection Time: 04/03/14  5:27 AM  Result Value Ref Range   FIO2 100.00 %    Delivery systems VENTILATOR    Mode PRESSURE REGULATED VOLUME CONTROL    VT 550 mL   Rate 24 resp/min   Peep/cpap 3.0 cm H20   pH, Arterial 7.172 (LL) 7.350 - 7.450    Comment: CRITICAL RESULT CALLED TO, READ BACK BY AND VERIFIED WITH: SHERRI HAMMOCK RN BY K KNICK RRT RCP ON 04/03/2014 AT 0531    pCO2 arterial 26.1 (L) 35.0 - 45.0 mmHg   pO2, Arterial 89.4 80.0 - 100.0 mmHg   Bicarbonate 9.2 (L) 20.0 - 24.0 mEq/L   TCO2 9.0 0 - 100 mmol/L   Acid-base deficit 17.7 (H) 0.0 - 2.0 mmol/L   O2 Saturation 94.0 %   Patient temperature 37.0    Collection site LEFT RADIAL    Drawn by [id number]    Sample type ARTERIAL    Allens test (pass/fail) PASS PASS  CBC with Differential     Status: Abnormal   Collection Time: 04/03/14  2:07 PM  Result Value Ref Range   WBC 43.9 (H) 4.0 - 10.5 K/uL   RBC 3.83 (L) 3.87 - 5.11 MIL/uL   Hemoglobin 10.3 (L) 12.0 - 15.0 g/dL   HCT 32.5 (L) 36.0 - 46.0 %   MCV 84.9 78.0 - 100.0 fL   MCH 26.9 26.0 - 34.0 pg   MCHC 31.7 30.0 - 36.0 g/dL   RDW 17.0 (H) 11.5 - 15.5 %   Platelets 119 (L) 150 - 400 K/uL   Neutrophils Relative % 96 (H) 43 - 77 %   Neutro Abs 42.2 (H) 1.7 - 7.7 K/uL   Lymphocytes Relative 1 (L) 12 - 46 %   Lymphs Abs 0.6 (L) 0.7 - 4.0 K/uL   Monocytes Relative 2 (L) 3 - 12 %   Monocytes Absolute 1.0 0.1 - 1.0 K/uL   Eosinophils Relative 0  0 - 5 %   Eosinophils Absolute 0.0 0.0 - 0.7 K/uL   Basophils Relative 0 0 - 1 %   Basophils Absolute 0.0 0.0 - 0.1 K/uL   Smear Review LARGE PLATELETS PRESENT     Comment: SPECIMEN CHECKED FOR CLOTS PLATELETS APPEAR DECREASED PLATELET COUNT CONFIRMED BY SMEAR   Heparin level (unfractionated)     Status: Abnormal   Collection Time: 04/03/14  2:07 PM  Result Value Ref Range   Heparin Unfractionated 0.19 (L) 0.30 - 0.70 IU/mL    Comment:        IF HEPARIN RESULTS ARE BELOW EXPECTED VALUES, AND PATIENT DOSAGE HAS BEEN CONFIRMED, SUGGEST FOLLOW UP TESTING OF ANTITHROMBIN III LEVELS.   Heparin level  (unfractionated)     Status: Abnormal   Collection Time: 04/03/14 10:00 PM  Result Value Ref Range   Heparin Unfractionated 0.18 (L) 0.30 - 0.70 IU/mL    Comment:        IF HEPARIN RESULTS ARE BELOW EXPECTED VALUES, AND PATIENT DOSAGE HAS BEEN CONFIRMED, SUGGEST FOLLOW UP TESTING OF ANTITHROMBIN III LEVELS.   CBC     Status: Abnormal   Collection Time: 04/04/14  5:05 AM  Result Value Ref Range   WBC 45.4 (H) 4.0 - 10.5 K/uL   RBC 4.06 3.87 - 5.11 MIL/uL   Hemoglobin 10.7 (L) 12.0 - 15.0 g/dL   HCT 33.4 (L) 36.0 - 46.0 %   MCV 82.3 78.0 - 100.0 fL   MCH 26.4 26.0 - 34.0 pg   MCHC 32.0 30.0 - 36.0 g/dL   RDW 17.0 (H) 11.5 - 15.5 %   Platelets 136 (L) 150 - 400 K/uL  Heparin level (unfractionated)     Status: Abnormal   Collection Time: 04/04/14  5:05 AM  Result Value Ref Range   Heparin Unfractionated 0.10 (L) 0.30 - 0.70 IU/mL    Comment:        IF HEPARIN RESULTS ARE BELOW EXPECTED VALUES, AND PATIENT DOSAGE HAS BEEN CONFIRMED, SUGGEST FOLLOW UP TESTING OF ANTITHROMBIN III LEVELS.   Basic metabolic panel     Status: Abnormal   Collection Time: 04/04/14  5:05 AM  Result Value Ref Range   Sodium 136 (L) 137 - 147 mEq/L   Potassium 5.3 3.7 - 5.3 mEq/L   Chloride 93 (L) 96 - 112 mEq/L   CO2 16 (L) 19 - 32 mEq/L   Glucose, Bld 79 70 - 99 mg/dL   BUN 17 6 - 23 mg/dL   Creatinine, Ser 2.61 (H) 0.50 - 1.10 mg/dL    Comment: DELTA CHECK NOTED   Calcium 6.1 (LL) 8.4 - 10.5 mg/dL    Comment: CRITICAL RESULT CALLED TO, READ BACK BY AND VERIFIED WITH: DANIELS,J AT 6:35AM ON 04/04/14 BY FESTERMAN,C    GFR calc non Af Amer 16 (L) >90 mL/min   GFR calc Af Amer 19 (L) >90 mL/min    Comment: (NOTE) The eGFR has been calculated using the CKD EPI equation. This calculation has not been validated in all clinical situations. eGFR's persistently <90 mL/min signify possible Chronic Kidney Disease.   Procalcitonin     Status: None   Collection Time: 04/04/14  5:05 AM  Result Value  Ref Range   Procalcitonin 9.36 ng/mL    Comment:        Interpretation: PCT > 2 ng/mL: Systemic infection (sepsis) is likely, unless other causes are known. (NOTE)         ICU PCT Algorithm  Non ICU PCT Algorithm    ----------------------------     ------------------------------         PCT < 0.25 ng/mL                 PCT < 0.1 ng/mL     Stopping of antibiotics            Stopping of antibiotics       strongly encouraged.               strongly encouraged.    ----------------------------     ------------------------------       PCT level decrease by               PCT < 0.25 ng/mL       >= 80% from peak PCT       OR PCT 0.25 - 0.5 ng/mL          Stopping of antibiotics                                             encouraged.     Stopping of antibiotics           encouraged.    ----------------------------     ------------------------------       PCT level decrease by              PCT >= 0.25 ng/mL       < 80% from peak PCT        AND PCT >= 0.5 ng/mL            Continuing antibiotics                                               encouraged.       Continuing antibiotics            encouraged.    ----------------------------     ------------------------------     PCT level increase compared          PCT > 0.5 ng/mL         with peak PCT AND          PCT >= 0.5 ng/mL             Escalation of antibiotics                                          strongly encouraged.      Escalation of antibiotics        strongly encouraged.   Troponin I     Status: Abnormal   Collection Time: 04/04/14  5:05 AM  Result Value Ref Range   Troponin I 0.40 (HH) <0.30 ng/mL    Comment: CRITICAL RESULT CALLED TO, READ BACK BY AND VERIFIED WITH: DANIELS,J AT 6:35AM ON 04/04/14 BY FESTERMAN,C        Due to the release kinetics of cTnI, a negative result within the first hours of the onset of symptoms does not rule out myocardial infarction with certainty. If myocardial infarction is  still suspected, repeat the test at appropriate intervals.   Lactic acid, plasma  Status: Abnormal   Collection Time: 04/04/14  5:05 AM  Result Value Ref Range   Lactic Acid, Venous 11.2 (H) 0.5 - 2.2 mmol/L  Pro b natriuretic peptide     Status: Abnormal   Collection Time: 04/04/14  5:05 AM  Result Value Ref Range   Pro B Natriuretic peptide (BNP) 49144.0 (H) 0 - 450 pg/mL    ABGS  Recent Labs  04/03/14 0527  PHART 7.172*  PO2ART 89.4  TCO2 9.0  HCO3 9.2*   CULTURES Recent Results (from the past 240 hour(s))  Clostridium Difficile by PCR     Status: Abnormal   Collection Time: 04/04/2014 10:22 PM  Result Value Ref Range Status   C difficile by pcr POSITIVE (A) NEGATIVE Final    Comment: CRITICAL RESULT CALLED TO, READ BACK BY AND VERIFIED WITH:  HAMILTON,S @ 1245 ON 04/04/2014 BY WOODIE,J   Clostridium Difficile by PCR     Status: Abnormal   Collection Time: 04/01/14  1:00 PM  Result Value Ref Range Status   C difficile by pcr POSITIVE (A) NEGATIVE Final    Comment: CRITICAL RESULT CALLED TO, READ BACK BY AND VERIFIED WITH: SEIGLA,J. AT 1703 ON 04/01/2014 BY BAUGHAM,M.   Culture, blood (routine x 2)     Status: None (Preliminary result)   Collection Time: 04/02/14  6:20 PM  Result Value Ref Range Status   Specimen Description BLOOD LEFT ARM  Final   Special Requests BOTTLES DRAWN AEROBIC ONLY 4CC  Final   Culture  Setup Time   Final    04/03/2014 17:10 Performed at [place] Gram Stain Report Called to,Read Back By and Verified With: [facility] AT 8099 BY HUFFINES,S ON 04/03/14    Report Status PENDING  Incomplete  Culture, blood (routine x 2)     Status: None (Preliminary result)   Collection Time: 04/02/14  6:25 PM  Result Value Ref Range Status   Specimen Description PORTA CATH  Final   Special Requests BOTTLES DRAWN AEROBIC AND ANAEROBIC 6CC  Final   Culture NO GROWTH 2 DAYS  Final   Report Status  PENDING  Incomplete   Studies/Results: Dg Chest 1 View  04/02/2014   CLINICAL DATA:  Acute worsening shortness of breath  EXAM: CHEST - 1 VIEW  COMPARISON:  03/29/2014, 02/25/2014  FINDINGS: Cardiomegaly with worsening vascular and interstitial prominence compatible with edema. Effusions are evident for with small effusions bilaterally. Pattern compatible with CHF. There is associated bibasilar atelectasis/ collapse. No pneumothorax. Trachea is midline. Atherosclerosis of the aorta.  IMPRESSION: Cardiomegaly with new developing interstitial edema and small effusions compatible with CHF  Bibasilar atelectasis.   Electronically Signed   By: Daryll Brod M.D.   On: 04/02/2014 11:02   Dg Chest 1v Repeat Same Day  04/02/2014   CLINICAL DATA:  Intubation.  Post intubation radiographs.  EXAM: CHEST - 1 VIEW SAME DAY  COMPARISON:  04/02/2014 at 1000 hr.  FINDINGS: Support apparatus: Interval intubation with the endotracheal tube tip 64 mm from the carina. Monitoring leads project over the chest. Defibrillator pads overlie the chest.  Cardiomediastinal Silhouette: Cardiomegaly. The cardiopericardial silhouette is better seen than on the prior exam due to improved aeration.  Lungs: Improved lung volumes with worse moderate to severe CHF with bilateral pulmonary edema. No pneumothorax.  Effusions:  Probable small LEFT  Other:  None.  IMPRESSION: 1. Interval intubation. 2. Improved lung volumes with moderate  to severe CHF which appears worse in the interval since the prior exam. Allowing for changes in pulmonary volumes, of the amount of pulmonary edema is greater.   Electronically Signed   By: Dereck Ligas M.D.   On: 04/02/2014 12:44   Dg Chest Port 1 View  04/03/2014   CLINICAL DATA:  Orogastric tube placement .  EXAM: PORTABLE CHEST - 1 VIEW  COMPARISON:  04/03/2014 .  FINDINGS: Endotracheal tube and OG tube in good anatomic position. Cardiomegaly with bilateral pulmonary venous congestion and bilateral  pulmonary edema with bilateral pleural effusions. Mild improvement pulmonary edema. These findings consistent congestive heart failure. No pneumothorax.  IMPRESSION: 1. Endotracheal tube and orogastric tube in good anatomic position. 2. Congestive heart failure with bilateral pulmonary edema and bilateral effusions. Interim slight improvement of pulmonary edema.   Electronically Signed   By: Marcello Moores  Register   On: 04/03/2014 17:14   Dg Chest Port 1 View  04/03/2014   CLINICAL DATA:  Intubated.  Central line placement.  EXAM: PORTABLE CHEST - 1 VIEW  COMPARISON:  Yesterday.  FINDINGS: The endotracheal tube is in satisfactory position. No interval central venous catheter is seen. No pneumothorax. Stable enlarged cardiac silhouette and diffuse prominence of the pulmonary vasculature and interstitial markings. No significant change bilateral airspace opacity. Small right pleural effusion. Unremarkable bones.  IMPRESSION: 1. No central venous catheter is seen. 2. Stable changes of congestive heart failure.   Electronically Signed   By: Enrique Sack M.D.   On: 04/03/2014 07:24    Medications:  Prior to Admission:  Prescriptions prior to admission  Medication Sig Dispense Refill Last Dose  . amLODipine (NORVASC) 5 MG tablet Take 5 mg by mouth daily.   04/02/2014  . apixaban (ELIQUIS) 5 MG TABS tablet Take 1 tablet (5 mg total) by mouth 2 (two) times daily. 60 tablet 3 03/09/2014 at 11:30  . capsaicin (ZOSTRIX) 0.025 % cream Apply topically 2 (two) times daily. (Patient taking differently: Apply 1 application topically 2 (two) times daily as needed. ) 60 g 0 Past Month  . HYDROcodone-acetaminophen (NORCO/VICODIN) 5-325 MG per tablet Take 1 tablet by mouth every 5 (five) hours as needed (pain).   04/01/2014  . levothyroxine (SYNTHROID, LEVOTHROID) 100 MCG tablet Take 100 mcg by mouth daily. Do not take synthroid on Mon and Thursdays   03/27/2014  . lisinopril (PRINIVIL,ZESTRIL) 10 MG tablet Take 1 tablet (10  mg total) by mouth daily. 30 tablet 3 03/12/2014  . metoprolol succinate (TOPROL-XL) 25 MG 24 hr tablet Take 12.5 mg by mouth daily.   04/03/2014 at 1130  . ondansetron (ZOFRAN) 4 MG tablet Take 4 mg by mouth every 8 (eight) hours as needed for nausea or vomiting.   04/04/2014  . pregabalin (LYRICA) 50 MG capsule Take 50 mg by mouth 3 (three) times daily as needed (Pain).    03/27/2014  . Probiotic Product (ALIGN PO) Take 1 tablet by mouth daily.   Past Week  . ranitidine (ZANTAC) 150 MG capsule Take 150 mg by mouth 2 (two) times daily.   03/24/2014  . Red Yeast Rice Extract (RED YEAST RICE PO) Take 1 capsule by mouth daily.   03/24/2014   Scheduled: . antiseptic oral rinse  7 mL Mouth Rinse QID  . chlorhexidine  15 mL Mouth Rinse BID  . ciprofloxacin  400 mg Intravenous Q12H  . citalopram  20 mg Oral Daily  . ferrous sulfate  325 mg Oral Q breakfast  . hydrocortisone sod succinate (  SOLU-CORTEF) inj  100 mg Intravenous Q6H  . levothyroxine  50 mcg Intravenous Once per day on Sun Tue Wed Fri Sat  . LORazepam  1 mg Oral QHS  . metronidazole  500 mg Intravenous Q8H  . pantoprazole (PROTONIX) IV  40 mg Intravenous Q24H  . saccharomyces boulardii  250 mg Oral BID  . sodium chloride  10-40 mL Intracatheter Q12H  . sodium chloride  10-40 mL Intracatheter Q12H  . vancomycin  250 mg Oral 4 times per day  . vancomycin  750 mg Intravenous Q24H   Continuous: . DOPamine 10 mcg/kg/min (04/04/14 0800)  . fentaNYL infusion INTRAVENOUS 125 mcg/hr (04/04/14 0800)  . furosemide (LASIX) infusion    . heparin 1,850 Units/hr (04/04/14 0806)  . norepinephrine (LEVOPHED) Adult infusion Stopped (04/04/14 0806)  .  sodium bicarbonate infusion 1/4 NS 1000 mL 125 mL/hr at 04/03/14 7564   PPI:RJJOACZYS, fentaNYL, fentaNYL, LORazepam, menthol-cetylpyridinium, metoprolol, midazolam, midazolam, morphine injection, ondansetron **OR** ondansetron (ZOFRAN) IV, promethazine, sodium chloride, sodium  chloride  Assesment: She has acute respiratory failure after cardiopulmonary arrest. She remains on the ventilator but oxygenation is better she is down now from 100% to 60%.  She has sepsis with positive blood culture and her laboratory assessment for continued sepsis continues to be positive with lactate level of approximately 11 and pro-calcitonin still elevated and actually somewhat higher. However her blood pressure is improving and she is requiring less pressors.  She has congestive heart failure and proBNP is still very high she still has evidence of CHF on chest x-ray but there's not much else that were going to be able to do right now because of her renal failure. She is making urine.  She had demand ischemia.  She was admitted with diverticulitis of the colon with perforation and concomitant  Clostridium difficile.  She has severe protein calorie malnutrition and I think we should start TPN.  Her platelet count has dropped dramatically which may be related to her sepsis but may be related to heparin. Heparin-induced thrombocytopenia profile has been sent to the lab but in the meantime she is going to be switched from heparin for now Active Problems:   Hypertension   Thyroid disease   Diverticulitis of colon   Atrial fibrillation   Weakness generalized   Protein-calorie malnutrition, severe   CHF (congestive heart failure)   Diverticulosis   Gastroenteritis   Diverticulitis   Diverticulitis of colon with perforation   Demand ischemia    Plan: Continue current treatments. Start TPN. Switch from heparin per pharmacy. Continue all the other medications. Discussed with her husband at bedside    LOS: 7 days   Danielle [name] L 04/04/2014, 9:06 AM

## 2014-04-04 NOTE — Progress Notes (Signed)
PARENTERAL NUTRITION CONSULT NOTE - INITIAL  Pharmacy Consult for TPN Indication: Intolerance to enteral feeding  Allergies  Allergen Reactions  . Thyroid Hormones Hives and Rash    proparathyroid thyroid medication    Patient Measurements: Height: 5\' 7"  (170.2 cm) Weight: 164 lb 7.4 oz (74.6 kg) IBW/kg (Calculated) : 61.6 Usual Weight: 145 lbs  Vital Signs: Temp: 97.7 F (36.5 C) (11/28 0530) Temp Source: Axillary (11/28 0530) BP: 107/63 mmHg (11/28 0945) Pulse Rate: 94 (11/28 0945) Intake/Output from previous day: 11/27 0701 - 11/28 0700 In: 5465 [I.V.:4325; NG/GT:240; IV Piggyback:900] Out: 1175 [Urine:775; Emesis/NG output:400] Intake/Output from this shift: Total I/O In: 45.6 [I.V.:45.6] Out: 125 [Urine:125]  Labs:  Recent Labs  04/03/14 0429 04/03/14 1407 04/04/14 0505  WBC 47.8* 43.9* 45.4*  HGB 10.4* 10.3* 10.7*  HCT 34.9* 32.5* 33.4*  PLT 140* 119* 136*  APTT  --   --  133*     Recent Labs  04/02/14 1145  04/02/14 2059 04/03/14 0429 04/04/14 0505  NA  --   < > 138 140 136*  K  --   < > 5.9* 5.2 5.3  CL  --   < > 102 99 93*  CO2  --   < > 6* 9* 16*  GLUCOSE  --   < > 77 193* 79  BUN  --   < > 11 12 17   CREATININE  --   < > 1.50* 1.72* 2.61*  CALCIUM  --   < > 7.1* 6.9* 6.1*  MG 1.6  --   --   --   --   PHOS 5.0*  --   --   --   --   < > = values in this interval not displayed. Estimated Creatinine Clearance: 17.8 mL/min (by C-G formula based on Cr of 2.61).   No results for input(s): GLUCAP in the last 72 hours.  Medical History: Past Medical History  Diagnosis Date  . Osteoarthritis   . Macular degeneration   . Essential hypertension   . Hypothyroidism   . Thyroid cancer   . GI bleed   . Atrial fibrillation   . C. difficile colitis   . Diverticulitis   . Shingles   . Lupus   . LBBB (left bundle branch block)   . Secondary cardiomyopathy     LVEF 40-45% February 2015    Medications:  Scheduled:  . antiseptic oral rinse   7 mL Mouth Rinse QID  . chlorhexidine  15 mL Mouth Rinse BID  . ciprofloxacin  400 mg Intravenous Q12H  . citalopram  20 mg Oral Daily  . ferrous sulfate  325 mg Oral Q breakfast  . hydrocortisone sod succinate (SOLU-CORTEF) inj  100 mg Intravenous Q6H  . levothyroxine  50 mcg Intravenous Once per day on Sun Tue Wed Fri Sat  . LORazepam  1 mg Oral QHS  . metronidazole  500 mg Intravenous Q8H  . pantoprazole (PROTONIX) IV  40 mg Intravenous Q24H  . saccharomyces boulardii  250 mg Oral BID  . sodium chloride  10-40 mL Intracatheter Q12H  . sodium chloride  10-40 mL Intracatheter Q12H  . vancomycin  250 mg Oral 4 times per day  . vancomycin  750 mg Intravenous Q24H    Insulin Requirements in the past 24 hours:  None  Current Nutrition:  None, intubated  Assessment: Patient intubated, unable to tolerate tube feedings Clinimix 5/15 E at goal rate of 60 ml/hr and Lipids 20% 10 ml/hr  will supply 1502 kcal and 120 gm protein   Nutritional Goals:  1495 kCal, 103 grams of protein per day  Plan:  Clinimix E 5/15 starting at 30 ml/hr, increase to goal rate of 60 ml/hr as tolerated MVI & Trace Elements daily Lipids 20% at 10 ml/hr  Labs per protocol  Abner Greenspan, Laraine Samet Bennett 04/04/2014,10:08 AM

## 2014-04-04 NOTE — Progress Notes (Signed)
Subjective: Interval History: None Objective: Vital signs in last 24 hours: Temp:  [97.2 F (36.2 C)-98.9 F (37.2 C)] 97.7 F (36.5 C) (11/28 0530) Pulse Rate:  [86-103] 95 (11/28 0800) Resp:  [18-26] 21 (11/28 0800) BP: (68-131)/(42-86) 100/64 mmHg (11/28 0800) SpO2:  [86 %-99 %] 94 % (11/28 0800) FiO2 (%):  [60 %-100 %] 60 % (11/28 0701) Weight:  [74.6 kg (164 lb 7.4 oz)] 74.6 kg (164 lb 7.4 oz) (11/28 0530) Weight change: 5.7 kg (12 lb 9.1 oz)  Intake/Output from previous day: 11/27 0701 - 11/28 0700 In: 5465 [I.V.:4325; NG/GT:240; IV Piggyback:900] Out: 1175 [Urine:775; Emesis/NG output:400] Intake/Output this shift: Total I/O In: -  Out: 125 [Urine:125]  Generally patient is intubated but arousable. Chest: She has expiratory rhonchi. Heart exam reveals regular rate and rhythm no murmur Extremities no edema  Lab Results:  Recent Labs  04/03/14 1407 04/04/14 0505  WBC 43.9* 45.4*  HGB 10.3* 10.7*  HCT 32.5* 33.4*  PLT 119* 136*   BMET:  Recent Labs  04/03/14 0429 04/04/14 0505  NA 140 136*  K 5.2 5.3  CL 99 93*  CO2 9* 16*  GLUCOSE 193* 79  BUN 12 17  CREATININE 1.72* 2.61*  CALCIUM 6.9* 6.1*   No results for input(s): PTH in the last 72 hours. Iron Studies:  Recent Labs  04/01/14 1351  IRON 14*  TIBC 160*  FERRITIN 240    Studies/Results: Dg Chest 1 View  04/02/2014   CLINICAL DATA:  Acute worsening shortness of breath  EXAM: CHEST - 1 VIEW  COMPARISON:  03/08/2014, 02/25/2014  FINDINGS: Cardiomegaly with worsening vascular and interstitial prominence compatible with edema. Effusions are evident for with small effusions bilaterally. Pattern compatible with CHF. There is associated bibasilar atelectasis/ collapse. No pneumothorax. Trachea is midline. Atherosclerosis of the aorta.  IMPRESSION: Cardiomegaly with new developing interstitial edema and small effusions compatible with CHF  Bibasilar atelectasis.   Electronically Signed   By: Daryll Brod M.D.   On: 04/02/2014 11:02   Dg Chest 1v Repeat Same Day  04/02/2014   CLINICAL DATA:  Intubation.  Post intubation radiographs.  EXAM: CHEST - 1 VIEW SAME DAY  COMPARISON:  04/02/2014 at 1000 hr.  FINDINGS: Support apparatus: Interval intubation with the endotracheal tube tip 64 mm from the carina. Monitoring leads project over the chest. Defibrillator pads overlie the chest.  Cardiomediastinal Silhouette: Cardiomegaly. The cardiopericardial silhouette is better seen than on the prior exam due to improved aeration.  Lungs: Improved lung volumes with worse moderate to severe CHF with bilateral pulmonary edema. No pneumothorax.  Effusions:  Probable small LEFT  Other:  None.  IMPRESSION: 1. Interval intubation. 2. Improved lung volumes with moderate to severe CHF which appears worse in the interval since the prior exam. Allowing for changes in pulmonary volumes, of the amount of pulmonary edema is greater.   Electronically Signed   By: Dereck Ligas M.D.   On: 04/02/2014 12:44   Dg Chest Port 1 View  04/03/2014   CLINICAL DATA:  Orogastric tube placement .  EXAM: PORTABLE CHEST - 1 VIEW  COMPARISON:  04/03/2014 .  FINDINGS: Endotracheal tube and OG tube in good anatomic position. Cardiomegaly with bilateral pulmonary venous congestion and bilateral pulmonary edema with bilateral pleural effusions. Mild improvement pulmonary edema. These findings consistent congestive heart failure. No pneumothorax.  IMPRESSION: 1. Endotracheal tube and orogastric tube in good anatomic position. 2. Congestive heart failure with bilateral pulmonary edema and bilateral effusions. Interim  slight improvement of pulmonary edema.   Electronically Signed   By: Marcello Moores  Register   On: 04/03/2014 17:14   Dg Chest Port 1 View  04/03/2014   CLINICAL DATA:  Intubated.  Central line placement.  EXAM: PORTABLE CHEST - 1 VIEW  COMPARISON:  Yesterday.  FINDINGS: The endotracheal tube is in satisfactory position. No interval  central venous catheter is seen. No pneumothorax. Stable enlarged cardiac silhouette and diffuse prominence of the pulmonary vasculature and interstitial markings. No significant change bilateral airspace opacity. Small right pleural effusion. Unremarkable bones.  IMPRESSION: 1. No central venous catheter is seen. 2. Stable changes of congestive heart failure.   Electronically Signed   By: Enrique Sack M.D.   On: 04/03/2014 07:24    I have reviewed the patient's current medications.  Assessment/Plan: Problem #1 acute kidney injury: Most likely non-oliguric. Presently her BUN and creatinine is increasing. Patient however is comforted to nonoliguric. Patient is on Lasix. Problem #2 hypotension: Patient on pressure support blood pressure is fluctuating presently seems to be reasonable. Possibly a combination of septic and cardiogenic shock. Problem #3 arterial fibrillation: Presently seems to be comforted to sinus rhythm Problem #4 metabolic acidosis: Patient on sodium bicarbonate and her CO2 has improved to 16. Problem #5 anemia Problem #6 hypokalemia: Potassium is 5.3 corrected Problem #7 septic shock: A combination of diverticulitis with perforation and C. difficile colitis. Plan: We'll continue with IV fluid at present rate We'll change her IV Lasix into a continuous drip at 8 milligrams per hour  We'll check her basic metabolic panel in the morning.   LOS: 7 days   Saurabh Hettich S 04/04/2014,8:17 AM

## 2014-04-04 NOTE — Progress Notes (Signed)
ANTIBIOTIC CONSULT NOTE -  Pharmacy Consult for Vancomycin Indication: Bacteremia  Allergies  Allergen Reactions  . Thyroid Hormones Hives and Rash    proparathyroid thyroid medication    Patient Measurements: Height: 5\' 7"  (170.2 cm) Weight: 164 lb 7.4 oz (74.6 kg) IBW/kg (Calculated) : 61.6 Adjusted Body Weight:   Vital Signs: Temp: 97.7 F (36.5 C) (11/28 0530) Temp Source: Axillary (11/28 0530) BP: 84/54 mmHg (11/28 1100) Pulse Rate: 76 (11/28 1115) Intake/Output from previous day: 11/27 0701 - 11/28 0700 In: 5465 [I.V.:4325; NG/GT:240; IV Piggyback:900] Out: 1175 [Urine:775; Emesis/NG output:400] Intake/Output from this shift: Total I/O In: 315.4 [I.V.:115.4; IV Piggyback:200] Out: 125 [Urine:125]  Labs:  Recent Labs  04/02/14 2059 04/03/14 0429 04/03/14 1407 04/04/14 0505  WBC  --  47.8* 43.9* 45.4*  HGB  --  10.4* 10.3* 10.7*  PLT  --  140* 119* 136*  CREATININE 1.50* 1.72*  --  2.61*   Estimated Creatinine Clearance: 17.8 mL/min (by C-G formula based on Cr of 2.61). No results for input(s): VANCOTROUGH, VANCOPEAK, VANCORANDOM, GENTTROUGH, GENTPEAK, GENTRANDOM, TOBRATROUGH, TOBRAPEAK, TOBRARND, AMIKACINPEAK, AMIKACINTROU, AMIKACIN in the last 72 hours.   Microbiology: Recent Results (from the past 720 hour(s))  Clostridium Difficile by PCR     Status: Abnormal   Collection Time: 04/02/2014 10:22 PM  Result Value Ref Range Status   C difficile by pcr POSITIVE (A) NEGATIVE Final    Comment: CRITICAL RESULT CALLED TO, READ BACK BY AND VERIFIED WITH:  HAMILTON,S @ 2458 ON 03/18/2014 BY WOODIE,J   Clostridium Difficile by PCR     Status: Abnormal   Collection Time: 04/01/14  1:00 PM  Result Value Ref Range Status   C difficile by pcr POSITIVE (A) NEGATIVE Final    Comment: CRITICAL RESULT CALLED TO, READ BACK BY AND VERIFIED WITH: SEIGLA,J. AT 1703 ON 04/01/2014 BY BAUGHAM,M.   Culture, blood (routine x 2)     Status: None   Collection Time: 04/02/14   6:20 PM  Result Value Ref Range Status   Specimen Description BLOOD LEFT ARM  Final   Special Requests BOTTLES DRAWN AEROBIC ONLY 4CC  Final   Culture  Setup Time   Final    04/03/2014 17:10 Performed at [facility]    Culture   Final    STAPHYLOCOCCUS SPECIES (COAGULASE NEGATIVE) Note: THE SIGNIFICANCE OF ISOLATING THIS ORGANISM FROM A SINGLE VENIPUNCTURE CANNOT BE PREDICTED WITHOUT FURTHER CLINICAL AND CULTURE CORRELATION. SUSCEPTIBILITIES AVAILABLE ONLY ON REQUEST. Note: Gram Stain Report Called to,Read Back By and Verified With: SPANGLER E AT 0998 BY HUFFINES S ON 04/03/2014 Performed at [facility] Performed at [facility]    Report Status 04/04/2014 FINAL  Final  Culture, blood (routine x 2)     Status: None (Preliminary result)   Collection Time: 04/02/14  6:25 PM  Result Value Ref Range Status   Specimen Description PORTA CATH  Final   Special Requests BOTTLES DRAWN AEROBIC AND ANAEROBIC 6CC  Final   Culture NO GROWTH 2 DAYS  Final   Report Status PENDING  Incomplete    Medical History: Past Medical History  Diagnosis Date  . Osteoarthritis   . Macular degeneration   . Essential hypertension   . Hypothyroidism   . Thyroid cancer   . GI bleed   . Atrial fibrillation   . C. difficile colitis   . Diverticulitis   . Shingles   . Lupus   . LBBB (left bundle branch block)   . Secondary  cardiomyopathy     LVEF 40-45% February 2015    Medications:  Scheduled:  . antiseptic oral rinse  7 mL Mouth Rinse QID  . chlorhexidine  15 mL Mouth Rinse BID  . [START ON 04/18/14] ciprofloxacin  400 mg Intravenous Q24H  . citalopram  20 mg Oral Daily  . ferrous sulfate  325 mg Oral Q breakfast  . hydrocortisone sod succinate (SOLU-CORTEF) inj  100 mg Intravenous Q6H  . levothyroxine  50 mcg Intravenous Once per day on Sun Tue Wed Fri Sat  . LORazepam  1 mg Oral QHS  . metronidazole  500 mg Intravenous Q8H  . pantoprazole (PROTONIX) IV  40 mg  Intravenous Q24H  . saccharomyces boulardii  250 mg Oral BID  . sodium chloride  10-40 mL Intracatheter Q12H  . sodium chloride  10-40 mL Intracatheter Q12H  . vancomycin  250 mg Oral 4 times per day  . [START ON 04-18-14] vancomycin  1,000 mg Intravenous Q48H   Assessment: Positive blood culture, gram positive cocci Vancomycin per pharmacy protocol SCR increased, renal function worse  Goal of Therapy:  Vancomycin trough level 15-20 mcg/ml  Plan:  Change Vancomycin IV to 1 GM every 48 hours, next dose tomorrow 18-Apr-2014  Change Cipro 400 mg IV to every 24 hours Vancomycin trough level at steady state Monitor renal function Labs per protocol  Abner Greenspan, Gokul Waybright Bennett 04/04/2014,11:53 AM

## 2014-04-04 NOTE — Progress Notes (Signed)
Patient currently no code as discussed with family 48 hours ago catastrophic sequence of events to include asystole with resuscitation with concomitant ventricular fibrillation markedly diminished LV function EF dropped from 45% to 25% currently with multiple asystolic myocardial segments elevated troponins consistent with myocardial damage cardiogenic shock presumed septic shock at present 1 of 2 blood cultures grew gram-positive cocci preliminarily acute renal insufficiency severe metabolic acidosis currently on IV sodium bicarbonate infusion ECG with loss of anterior forces chronic left bundle branch block multiple pressor agents including dopamine and currently covered with IV vancomycin and by mouth vancomycin for C. difficile colitis IV Cipro initially for UTI and IV Flagyl grossly elevated leukocytosis and elevated pro calcitonin Danielle [name] RAQ:[id number] DOB: 07/29/1930 DOA: 03/20/2014 PCP: Danielle Curet, MD             Physical Exam: Blood pressure 100/64, pulse 95, temperature 97.7 F (36.5 C), temperature source Axillary, resp. rate 21, height 5\' 7"  (1.702 [name]), weight 164 lb 7.4 oz (74.6 kg), SpO2 94 %. patient arousable. To comprehend simple statements. Lungs show diminished breath sounds in the bases scattered rhonchi bilaterally good air entry bilaterally prolonged expiratory phase minimal bibasilar crepitations noted heart regular rhythm at present possible AV nodal block no S3 auscultated no heaves thrills or rubs abdomen soft nontender bowel sounds normoactive no peristaltic rushes no guarding no rebound   Investigations:  Recent Results (from the past 240 hour(s))  Clostridium Difficile by PCR     Status: Abnormal   Collection Time: 03/30/2014 10:22 PM  Result Value Ref Range Status   C difficile by pcr POSITIVE (A) NEGATIVE Final    Comment: CRITICAL RESULT CALLED TO, READ BACK BY AND VERIFIED WITH:  HAMILTON,S @ 4562 ON 03/10/2014 BY WOODIE,J   Clostridium  Difficile by PCR     Status: Abnormal   Collection Time: 04/01/14  1:00 PM  Result Value Ref Range Status   C difficile by pcr POSITIVE (A) NEGATIVE Final    Comment: CRITICAL RESULT CALLED TO, READ BACK BY AND VERIFIED WITH: SEIGLA,J. AT 1703 ON 04/01/2014 BY BAUGHAM,[name].   Culture, blood (routine x 2)     Status: None (Preliminary result)   Collection Time: 04/02/14  6:20 PM  Result Value Ref Range Status   Specimen Description BLOOD LEFT ARM  Final   Special Requests BOTTLES DRAWN AEROBIC ONLY 4CC  Final   Culture  Setup Time   Final    04/03/2014 17:10 Performed at [facility]   Final    GRAM POSITIVE COCCI Gram Stain Report Called to,Read Back By and Verified With: [facility] AT 5638 BY HUFFINES,S ON 04/03/14    Report Status PENDING  Incomplete  Culture, blood (routine x 2)     Status: None (Preliminary result)   Collection Time: 04/02/14  6:25 PM  Result Value Ref Range Status   Specimen Description PORTA CATH  Final   Special Requests BOTTLES DRAWN AEROBIC AND ANAEROBIC 6CC  Final   Culture NO GROWTH 2 DAYS  Final   Report Status PENDING  Incomplete     Basic Metabolic Panel:  Recent Labs  04/02/14 1145  04/03/14 0429 04/04/14 0505  NA  --   < > 140 136*  K  --   < > 5.2 5.3  CL  --   < > 99 93*  CO2  --   < > 9* 16*  GLUCOSE  --   < > 193* 79  BUN  --   < >  12 17  CREATININE  --   < > 1.72* 2.61*  CALCIUM  --   < > 6.9* 6.1*  MG 1.6  --   --   --   PHOS 5.0*  --   --   --   < > = values in this interval not displayed. Liver Function Tests: No results for input(s): AST, ALT, ALKPHOS, BILITOT, PROT, ALBUMIN in the last 72 hours.   CBC:  Recent Labs  04/03/14 1407 04/04/14 0505  WBC 43.9* 45.4*  NEUTROABS 42.2*  --   HGB 10.3* 10.7*  HCT 32.5* 33.4*  MCV 84.9 82.3  PLT 119* 136*    Dg Chest 1 View  04/02/2014   CLINICAL DATA:  Acute worsening shortness of breath  EXAM: CHEST - 1 VIEW  COMPARISON:  04/01/2014, 02/25/2014   FINDINGS: Cardiomegaly with worsening vascular and interstitial prominence compatible with edema. Effusions are evident for with small effusions bilaterally. Pattern compatible with CHF. There is associated bibasilar atelectasis/ collapse. No pneumothorax. Trachea is midline. Atherosclerosis of the aorta.  IMPRESSION: Cardiomegaly with new developing interstitial edema and small effusions compatible with CHF  Bibasilar atelectasis.   Electronically Signed   By: Daryll Brod [name].D.   On: 04/02/2014 11:02   Dg Chest 1v Repeat Same Day  04/02/2014   CLINICAL DATA:  Intubation.  Post intubation radiographs.  EXAM: CHEST - 1 VIEW SAME DAY  COMPARISON:  04/02/2014 at 1000 hr.  FINDINGS: Support apparatus: Interval intubation with the endotracheal tube tip 64 mm from the carina. Monitoring leads project over the chest. Defibrillator pads overlie the chest.  Cardiomediastinal Silhouette: Cardiomegaly. The cardiopericardial silhouette is better seen than on the prior exam due to improved aeration.  Lungs: Improved lung volumes with worse moderate to severe CHF with bilateral pulmonary edema. No pneumothorax.  Effusions:  Probable small LEFT  Other:  None.  IMPRESSION: 1. Interval intubation. 2. Improved lung volumes with moderate to severe CHF which appears worse in the interval since the prior exam. Allowing for changes in pulmonary volumes, of the amount of pulmonary edema is greater.   Electronically Signed   By: Dereck Ligas [name].D.   On: 04/02/2014 12:44   Dg Chest Port 1 View  04/03/2014   CLINICAL DATA:  Orogastric tube placement .  EXAM: PORTABLE CHEST - 1 VIEW  COMPARISON:  04/03/2014 .  FINDINGS: Endotracheal tube and OG tube in good anatomic position. Cardiomegaly with bilateral pulmonary venous congestion and bilateral pulmonary edema with bilateral pleural effusions. Mild improvement pulmonary edema. These findings consistent congestive heart failure. No pneumothorax.  IMPRESSION: 1. Endotracheal tube and  orogastric tube in good anatomic position. 2. Congestive heart failure with bilateral pulmonary edema and bilateral effusions. Interim slight improvement of pulmonary edema.   Electronically Signed   By: Marcello Moores  Register   On: 04/03/2014 17:14   Dg Chest Port 1 View  04/03/2014   CLINICAL DATA:  Intubated.  Central line placement.  EXAM: PORTABLE CHEST - 1 VIEW  COMPARISON:  Yesterday.  FINDINGS: The endotracheal tube is in satisfactory position. No interval central venous catheter is seen. No pneumothorax. Stable enlarged cardiac silhouette and diffuse prominence of the pulmonary vasculature and interstitial markings. No significant change bilateral airspace opacity. Small right pleural effusion. Unremarkable bones.  IMPRESSION: 1. No central venous catheter is seen. 2. Stable changes of congestive heart failure.   Electronically Signed   By: Enrique Sack [name].D.   On: 04/03/2014 07:24      Medications:  Impression: Presumed myocardial necrosis cardiogenic shock EF 25% septicemia Gram-positive cocci in 1 of 2 blood cultures Active Problems:   Hypertension   Thyroid disease   Diverticulitis of colon   Atrial fibrillation   Weakness generalized   Protein-calorie malnutrition, severe   CHF (congestive heart failure)   Diverticulosis   Gastroenteritis   Diverticulitis   Diverticulitis of colon with perforation   Demand ischemia     Plan: Patient is no code DO NOT RESUSCITATE prognosis is abysmal family and husband understand this. Continue aggressive antibiotic therapy and continue IV pressors and bicarbonate infusion for acidosis monitor INO continue Lasix aggressively appreciate input of GI surgery pulmonology nephrology and cardiology  Consultants: Nephrology and cardiology gastroenterology surgery pulmonology   Procedures   Antibiotics: IV vancomycin and by mouth vancomycin IV Cipro IV Flagyl                  Code Status: DO NOT RESUSCITATE  Family Communication:  Spoke at  length with husband today  Disposition Plan continue aggressive medical care for cardiogenic shock acidosis septicemia patient was not a surgical candidate throughout this hospitalization continue ventilatory support  Time spent: 45 minutes   LOS: 7 days   Danielle [name]   04/04/2014, 9:49 AM

## 2014-04-05 ENCOUNTER — Inpatient Hospital Stay (HOSPITAL_COMMUNITY): Payer: Medicare Other

## 2014-04-05 LAB — CBC
HEMATOCRIT: 32.9 % — AB (ref 36.0–46.0)
Hemoglobin: 10.3 g/dL — ABNORMAL LOW (ref 12.0–15.0)
MCH: 27.2 pg (ref 26.0–34.0)
MCHC: 31.3 g/dL (ref 30.0–36.0)
MCV: 86.8 fL (ref 78.0–100.0)
Platelets: 139 10*3/uL — ABNORMAL LOW (ref 150–400)
RBC: 3.79 MIL/uL — ABNORMAL LOW (ref 3.87–5.11)
RDW: 17.7 % — AB (ref 11.5–15.5)
WBC: 35.5 10*3/uL — AB (ref 4.0–10.5)

## 2014-04-05 LAB — DIFFERENTIAL
BASOS ABS: 0 10*3/uL (ref 0.0–0.1)
BASOS PCT: 0 % (ref 0–1)
Band Neutrophils: 16 % — ABNORMAL HIGH (ref 0–10)
Blasts: 0 %
Eosinophils Absolute: 0 10*3/uL (ref 0.0–0.7)
Eosinophils Relative: 0 % (ref 0–5)
LYMPHS ABS: 2.1 10*3/uL (ref 0.7–4.0)
LYMPHS PCT: 6 % — AB (ref 12–46)
MONOS PCT: 2 % — AB (ref 3–12)
Metamyelocytes Relative: 0 %
Monocytes Absolute: 0.7 10*3/uL (ref 0.1–1.0)
Myelocytes: 1 %
NRBC: 0 /100{WBCs}
Neutro Abs: 32.7 10*3/uL — ABNORMAL HIGH (ref 1.7–7.7)
Neutrophils Relative %: 73 % (ref 43–77)
Promyelocytes Absolute: 2 %

## 2014-04-05 LAB — TRIGLYCERIDES: Triglycerides: 338 mg/dL — ABNORMAL HIGH (ref <150)

## 2014-04-05 LAB — COMPREHENSIVE METABOLIC PANEL
ALK PHOS: 140 U/L — AB (ref 39–117)
ALT: 1275 U/L — AB (ref 0–35)
AST: 2853 U/L — ABNORMAL HIGH (ref 0–37)
Albumin: 1.7 g/dL — ABNORMAL LOW (ref 3.5–5.2)
BILIRUBIN TOTAL: 2.5 mg/dL — AB (ref 0.3–1.2)
BUN: 22 mg/dL (ref 6–23)
CHLORIDE: 87 meq/L — AB (ref 96–112)
CO2: 10 mEq/L — CL (ref 19–32)
Calcium: 5.3 mg/dL — CL (ref 8.4–10.5)
Creatinine, Ser: 3.47 mg/dL — ABNORMAL HIGH (ref 0.50–1.10)
GFR calc Af Amer: 13 mL/min — ABNORMAL LOW (ref >90)
GFR calc non Af Amer: 11 mL/min — ABNORMAL LOW (ref >90)
Glucose, Bld: 119 mg/dL — ABNORMAL HIGH (ref 70–99)
POTASSIUM: 6.4 meq/L — AB (ref 3.7–5.3)
SODIUM: 132 meq/L — AB (ref 137–147)
Total Protein: 3.9 g/dL — ABNORMAL LOW (ref 6.0–8.3)

## 2014-04-05 LAB — APTT
APTT: 142 s — AB (ref 24–37)
aPTT: 135 seconds — ABNORMAL HIGH (ref 24–37)

## 2014-04-05 LAB — PHOSPHORUS: Phosphorus: 8.8 mg/dL — ABNORMAL HIGH (ref 2.3–4.6)

## 2014-04-05 LAB — MAGNESIUM: Magnesium: 1.5 mg/dL (ref 1.5–2.5)

## 2014-04-05 LAB — PRO B NATRIURETIC PEPTIDE: PRO B NATRI PEPTIDE: 35923 pg/mL — AB (ref 0–450)

## 2014-04-05 MED ORDER — CALCIUM GLUCONATE 10 % IV SOLN
1.0000 g | Freq: Once | INTRAVENOUS | Status: AC
  Administered 2014-04-05: 1 g via INTRAVENOUS
  Filled 2014-04-05: qty 10

## 2014-04-05 MED ORDER — DEXTROSE 50 % IV SOLN
1.0000 | Freq: Once | INTRAVENOUS | Status: AC
  Administered 2014-04-05: 50 mL via INTRAVENOUS

## 2014-04-05 MED ORDER — DEXTROSE 5 % IV SOLN: INTRAVENOUS | Status: DC

## 2014-04-05 MED ORDER — DEXTROSE 5 % IV SOLN
1.0000 mg/h | INTRAVENOUS | Status: DC
  Administered 2014-04-05: 5 mg/h via INTRAVENOUS
  Filled 2014-04-05: qty 25

## 2014-04-05 MED ORDER — SODIUM POLYSTYRENE SULFONATE 15 GM/60ML PO SUSP: 30.0000 g | ORAL | Status: DC

## 2014-04-05 MED ORDER — SODIUM CHLORIDE 0.9 % IV SOLN
2.0000 g | Freq: Once | INTRAVENOUS | Status: DC
  Filled 2014-04-05: qty 20

## 2014-04-05 MED ORDER — LORAZEPAM BOLUS VIA INFUSION
2.0000 mg | INTRAVENOUS | Status: DC | PRN
  Filled 2014-04-05: qty 5

## 2014-04-05 MED ORDER — MORPHINE SULFATE 10 MG/ML IJ SOLN
10.0000 mg/h | INTRAVENOUS | Status: DC
  Administered 2014-04-05: 10 mg/h via INTRAVENOUS
  Filled 2014-04-05: qty 10

## 2014-04-05 MED ORDER — NOREPINEPHRINE BITARTRATE 1 MG/ML IV SOLN
INTRAVENOUS | Status: AC
  Filled 2014-04-05: qty 16

## 2014-04-05 MED ORDER — DEXTROSE 50 % IV SOLN
INTRAVENOUS | Status: AC
  Filled 2014-04-05: qty 50

## 2014-04-05 MED ORDER — SODIUM BICARBONATE 8.4 % IV SOLN
INTRAVENOUS | Status: DC
  Filled 2014-04-05 (×3): qty 150

## 2014-04-05 MED ORDER — INSULIN ASPART 100 UNIT/ML IV SOLN
5.0000 [IU] | Freq: Once | INTRAVENOUS | Status: AC
  Administered 2014-04-05: 5 [IU] via INTRAVENOUS

## 2014-04-06 ENCOUNTER — Ambulatory Visit (HOSPITAL_COMMUNITY): Payer: Medicare Other | Admitting: Physical Therapy

## 2014-04-06 LAB — PREALBUMIN: Prealbumin: 1.6 mg/dL — ABNORMAL LOW (ref 17.0–34.0)

## 2014-04-06 LAB — PATHOLOGIST SMEAR REVIEW

## 2014-04-06 NOTE — Care Management Utilization Note (Signed)
UR complete 

## 2014-04-07 LAB — CULTURE, BLOOD (ROUTINE X 2): CULTURE: NO GROWTH

## 2014-04-07 NOTE — Progress Notes (Signed)
Subjective: Interval History: None Objective: Vital signs in last 24 hours: Temp:  [97 F (36.1 C)-98.1 F (36.7 C)] 98.1 F (36.7 C) (11/29 0500) Pulse Rate:  [27-142] 63 (11/29 0345) Resp:  [21-31] 30 (11/29 0645) BP: (46-119)/(17-80) 68/32 mmHg (11/29 0645) SpO2:  [72 %-97 %] 95 % (11/29 0345) FiO2 (%):  [50 %-55 %] 50 % (11/29 0413) Weight:  [81.3 kg (179 lb 3.7 oz)] 81.3 kg (179 lb 3.7 oz) (11/29 0500) Weight change: 6.7 kg (14 lb 12.3 oz)  Intake/Output from previous day: 11/28 0701 - 11/29 0700 In: 6088 [I.V.:3825.3; NG/GT:150; IV Piggyback:1610; TPN:502.7] Out: 625 [Urine:525; Emesis/NG output:100] Intake/Output this shift:    Generally patient is intubated and unresponsive Chest: She has expiratory rhonchi. Heart: Tachycardic with heart rate of 135 m/min Extremities trace edema  Lab Results:  Recent Labs  04/04/14 1947 April 06, 2014 0501  WBC 35.5* 35.5*  HGB 10.3* 10.3*  HCT 31.9* 32.9*  PLT 122* 139*   BMET:   Recent Labs  04/04/14 1947 04-06-2014 0501  NA 133* 132*  K 5.3 6.4*  CL 90* 87*  CO2 17* 10*  GLUCOSE 138* 119*  BUN 22 22  CREATININE 3.15* 3.47*  CALCIUM 5.3* 5.3*   No results for input(s): PTH in the last 72 hours. Iron Studies: No results for input(s): IRON, TIBC, TRANSFERRIN, FERRITIN in the last 72 hours.  Studies/Results: Dg Chest Port 1 View  04/03/2014   CLINICAL DATA:  Orogastric tube placement .  EXAM: PORTABLE CHEST - 1 VIEW  COMPARISON:  04/03/2014 .  FINDINGS: Endotracheal tube and OG tube in good anatomic position. Cardiomegaly with bilateral pulmonary venous congestion and bilateral pulmonary edema with bilateral pleural effusions. Mild improvement pulmonary edema. These findings consistent congestive heart failure. No pneumothorax.  IMPRESSION: 1. Endotracheal tube and orogastric tube in good anatomic position. 2. Congestive heart failure with bilateral pulmonary edema and bilateral effusions. Interim slight improvement of  pulmonary edema.   Electronically Signed   By: Marcello Moores  Register   On: 04/03/2014 17:14    I have reviewed the patient's current medications.  Assessment/Plan: Problem #1 acute kidney injury: Oliguric.renal function is worsening Problem #2 hypotension: Patient on pressure is low she is back on Levophed maximum dose and she remains hypotensive with sbp in 60's and 70's. Possibly a combination of septic and cardiogenic shock. Problem #3 arterial fibrillation: Presently seems to be comforted to sinus rhythm Problem #4 metabolic acidosis: Patient on sodium bicarbonate and her CO2 has declined Problem #5 anemia Problem #6 hypokalemia: Potassium is 6.4 worsening  Problem #7 septic shock: A combination of diverticulitis with perforation and C. difficile colitis. Presently patient comfort only with poor prognosis and patient poor candidate for dialysis and have discussed with families and agreed Plan: change ivf to D5W with 150 meq sodium bicarbonate at 75 cc/hr Calcium gluconate 10 gm iv D50 1 amp iv one dose Insulin 5 un ites iv one dose    LOS: 8 days   Zorawar Strollo S 04-06-2014,7:24 AM

## 2014-04-07 NOTE — Discharge Summary (Signed)
Physician Discharge Summary  Danielle [name] QJJ:[id number] DOB: 18-May-1930 DOA: 03/27/2014  PCP: [name], MD  Admit date: 03/14/2014 Discharge date: 2014-04-24   Recommendations for Outpatient [facility] patient had been a no code was placed to comfort care measures only and requested family after discussion with Dr. [name] patient had a terminal wean off the ventilator given abysmal prognosis lack of chance for improvement family agreed patient was placed on terminal wean not quite extubated and had an asystolic event and expired she was pronounced dead at 10:02 AM patient's death summary was a combination of Danielle [name] colitis with perforated viscus which wall both chronic paroxysmal atrial fibrillation ischemic cardiomyopathy asystolic event with poor perfusion requiring CPR cardioversion. Patient then had left ventricular worsening dysfunction ejection fraction 25% by echo pulmonary edema and urea presumed acute renal failure due to acute tubular necrosis due to that started about urine with consultation from nephrology cardiology pulmonology she then presented with an exceedingly elevated leukocytosis with elevated lactate and pro-calcitonin presumed septic shock concomitantly in addition to cardiogenic shock is placed on vancomycin at that time in addition to her 3 other antibiotics which included by mouth vancomycin IV Cipro and IV Flagyl since admission patient was pronounced dead it wasn't no code as per family wishes and M.D. who agreed Discharge Diagnoses:  Active Problems:   Hypertension   Thyroid disease   Diverticulitis of colon   Atrial fibrillation   Weakness generalized   Protein-calorie malnutrition, severe   CHF (congestive heart failure)   Diverticulosis   Gastroenteritis   Diverticulitis   Diverticulitis of colon with perforation   Demand ischemia   Discharge Condition: Patient expired  Filed Weights   04/04/14 0530 24-Apr-2014 0500 04/24/2014 1045    Weight: 164 lb 7.4 oz (74.6 kg) 179 lb 3.7 oz (81.3 kg) 179 lb 3.7 oz (81.3 kg)    History of present illness:    Hospital Course:  See above  Procedures: Central line placed patient placed on ventilator for support after asystolic event  Consultations:  Pulmonology nephrology cardiology gastroenterology and general surgery  Discharge Instructions     Medication List    ASK your doctor about these medications        ALIGN PO  Take 1 tablet by mouth daily.     amLODipine 5 MG tablet  Commonly known as:  NORVASC  Take 5 mg by mouth daily.     apixaban 5 MG Tabs tablet  Commonly known as:  ELIQUIS  Take 1 tablet (5 mg total) by mouth 2 (two) times daily.     capsaicin 0.025 % cream  Commonly known as:  ZOSTRIX  Apply topically 2 (two) times daily.     HYDROcodone-acetaminophen 5-325 MG per tablet  Commonly known as:  NORCO/VICODIN  Take 1 tablet by mouth every 5 (five) hours as needed (pain).     levothyroxine 100 MCG tablet  Commonly known as:  SYNTHROID, LEVOTHROID  Take 100 mcg by mouth daily. Do not take synthroid on Mon and Thursdays     lisinopril 10 MG tablet  Commonly known as:  PRINIVIL,ZESTRIL  Take 1 tablet (10 mg total) by mouth daily.     metoprolol succinate 25 MG 24 hr tablet  Commonly known as:  TOPROL-XL  Take 12.5 mg by mouth daily.     ondansetron 4 MG tablet  Commonly known as:  ZOFRAN  Take 4 mg by mouth every 8 (eight) hours as needed for nausea or vomiting.  pregabalin 50 MG capsule  Commonly known as:  LYRICA  Take 50 mg by mouth 3 (three) times daily as needed (Pain).     ranitidine 150 MG capsule  Commonly known as:  ZANTAC  Take 150 mg by mouth 2 (two) times daily.     RED YEAST RICE PO  Take 1 capsule by mouth daily.       Allergies  Allergen Reactions  . Thyroid Hormones Hives and Rash    proparathyroid thyroid medication      The results of significant diagnostics from this hospitalization (including  imaging, microbiology, ancillary and laboratory) are listed below for reference.    Significant Diagnostic Studies: Dg Chest 1 View  04/02/2014   CLINICAL DATA:  Acute worsening shortness of breath  EXAM: CHEST - 1 VIEW  COMPARISON:  03/21/2014, 02/25/2014  FINDINGS: Cardiomegaly with worsening vascular and interstitial prominence compatible with edema. Effusions are evident for with small effusions bilaterally. Pattern compatible with CHF. There is associated bibasilar atelectasis/ collapse. No pneumothorax. Trachea is midline. Atherosclerosis of the aorta.  IMPRESSION: Cardiomegaly with new developing interstitial edema and small effusions compatible with CHF  Bibasilar atelectasis.   Electronically Signed   By: Daryll Brod M.D.   On: 04/02/2014 11:02   Ct Abdomen Pelvis W Contrast  04/03/2014   CLINICAL DATA:  80 year old female with left-sided abdominal and pelvic and nausea. History of Danielle [name] and thyroid cancer.  EXAM: CT ABDOMEN AND PELVIS WITH CONTRAST  TECHNIQUE: Multidetector CT imaging of the abdomen and pelvis was performed using the standard protocol following bolus administration of intravenous contrast.  CONTRAST:  168mL OMNIPAQUE IOHEXOL 300 MG/ML  SOLN  COMPARISON:  09/13/2013 and prior CTs dating back to 06/17/2013  FINDINGS: Cardiomegaly again identified.  Mildly enlarged lower thoracic periaortic lymph nodes are again noted with the index node measuring 11 mm in short axis (image 2).  There is moderate to extensive inflammation within the left and posterior pelvis with scattered foci of free air probably related to diverticulitis. Circumferential wall thickening of the sigmoid colon is noted which may be reactive.  Inflammation abuts anterior upper sacrum without evidence of osteomyelitis. This inflammation and gas also a lies along the medial aspect of the left psoas muscle.  No discrete focal abscess or small bowel obstruction identified.  There is new moderate left  hydroureteronephrosis caused by the left pelvic inflammation.  The liver, pancreas, adrenal glands, right kidney and gallbladder are unremarkable.  Splenic scarring is now noted.  The bladder and uterus are unremarkable.  Diverticulosis of the remaining colon identified.  A stable small paraumbilical hernia containing fat again noted.  A stable rim calcified lesion within the lower left pelvis is noted.  No acute or suspicious bony abnormalities are identified.  IMPRESSION: Moderate to extensive inflammation and scattered free air within the left and posterior pelvis likely related to rupture diverticulitis. No evidence of discrete focal abscess or bowel obstruction.  New moderate left hydroureteronephrosis caused by left pelvic inflammation.  Cardiomegaly, splenic scarring and small stable periumbilical hernia containing fat.   Electronically Signed   By: Hassan Rowan M.D.   On: 03/08/2014 18:07   Dg Chest 1v Repeat Same Day  04/02/2014   CLINICAL DATA:  Intubation.  Post intubation radiographs.  EXAM: CHEST - 1 VIEW SAME DAY  COMPARISON:  04/02/2014 at 1000 hr.  FINDINGS: Support apparatus: Interval intubation with the endotracheal tube tip 64 mm from the carina. Monitoring leads project over the chest. Defibrillator pads overlie  the chest.  Cardiomediastinal Silhouette: Cardiomegaly. The cardiopericardial silhouette is better seen than on the prior exam due to improved aeration.  Lungs: Improved lung volumes with worse moderate to severe CHF with bilateral pulmonary edema. No pneumothorax.  Effusions:  Probable small LEFT  Other:  None.  IMPRESSION: 1. Interval intubation. 2. Improved lung volumes with moderate to severe CHF which appears worse in the interval since the prior exam. Allowing for changes in pulmonary volumes, of the amount of pulmonary edema is greater.   Electronically Signed   By: Dereck Ligas M.D.   On: 04/02/2014 12:44   Dg Chest Port 1 View  04/03/2014   CLINICAL DATA:  Orogastric tube  placement .  EXAM: PORTABLE CHEST - 1 VIEW  COMPARISON:  04/03/2014 .  FINDINGS: Endotracheal tube and OG tube in good anatomic position. Cardiomegaly with bilateral pulmonary venous congestion and bilateral pulmonary edema with bilateral pleural effusions. Mild improvement pulmonary edema. These findings consistent congestive heart failure. No pneumothorax.  IMPRESSION: 1. Endotracheal tube and orogastric tube in good anatomic position. 2. Congestive heart failure with bilateral pulmonary edema and bilateral effusions. Interim slight improvement of pulmonary edema.   Electronically Signed   By: Marcello Moores  Register   On: 04/03/2014 17:14   Dg Chest Port 1 View  04/03/2014   CLINICAL DATA:  Intubated.  Central line placement.  EXAM: PORTABLE CHEST - 1 VIEW  COMPARISON:  Yesterday.  FINDINGS: The endotracheal tube is in satisfactory position. No interval central venous catheter is seen. No pneumothorax. Stable enlarged cardiac silhouette and diffuse prominence of the pulmonary vasculature and interstitial markings. No significant change bilateral airspace opacity. Small right pleural effusion. Unremarkable bones.  IMPRESSION: 1. No central venous catheter is seen. 2. Stable changes of congestive heart failure.   Electronically Signed   By: Enrique Sack M.D.   On: 04/03/2014 07:24    Microbiology: Recent Results (from the past 240 hour(s))  Clostridium [name] by PCR     Status: Abnormal   Collection Time: 03/19/2014 10:22 PM  Result Value Ref Range Status   C [name] by pcr POSITIVE (A) NEGATIVE Final    Comment: CRITICAL RESULT CALLED TO, READ BACK BY AND VERIFIED WITH:  HAMILTON,S @ 0109 ON 03/27/2014 BY WOODIE,J   Clostridium [name] by PCR     Status: Abnormal   Collection Time: 04/01/14  1:00 PM  Result Value Ref Range Status   C [name] by pcr POSITIVE (A) NEGATIVE Final    Comment: CRITICAL RESULT CALLED TO, READ BACK BY AND VERIFIED WITH: SEIGLA,J. AT 1703 ON 04/01/2014 BY BAUGHAM,M.     Culture, blood (routine x 2)     Status: None   Collection Time: 04/02/14  6:20 PM  Result Value Ref Range Status   Specimen Description BLOOD LEFT ARM  Final   Special Requests BOTTLES DRAWN AEROBIC ONLY 4CC  Final   Culture  Setup Time   Final    04/03/2014 17:10 Performed at [facility]    Culture   Final    STAPHYLOCOCCUS SPECIES (COAGULASE NEGATIVE) Note: THE SIGNIFICANCE OF ISOLATING THIS ORGANISM FROM A SINGLE VENIPUNCTURE CANNOT BE PREDICTED WITHOUT FURTHER CLINICAL AND CULTURE CORRELATION. SUSCEPTIBILITIES AVAILABLE ONLY ON REQUEST. Note: Gram Stain Report Called to,Read Back By and Verified With: SPANGLER E AT 3235 BY HUFFINES S ON 04/03/2014 Performed at [facility] Performed at [facility]    Report Status 04/04/2014 FINAL  Final  Culture, blood (routine x 2)  Status: None (Preliminary result)   Collection Time: 04/02/14  6:25 PM  Result Value Ref Range Status   Specimen Description PORTA CATH  Final   Special Requests BOTTLES DRAWN AEROBIC AND ANAEROBIC [facility]  Final   Culture NO GROWTH 3 DAYS  Final   Report Status PENDING  Incomplete     Labs: Basic Metabolic Panel:  Recent Labs Lab 04/02/14 1145  04/02/14 2059 04/03/14 0429 04/04/14 0505 04/04/14 1947 Apr 10, 2014 0501  NA  --   < > 138 140 136* 133* 132*  K  --   < > 5.9* 5.2 5.3 5.3 6.4*  CL  --   < > 102 99 93* 90* 87*  CO2  --   < > 6* 9* 16* 17* 10*  GLUCOSE  --   < > 77 193* 79 138* 119*  BUN  --   < > 11 12 17 22 22   CREATININE  --   < > 1.50* 1.72* 2.61* 3.15* 3.47*  CALCIUM  --   < > 7.1* 6.9* 6.1* 5.3* 5.3*  MG 1.6  --   --   --   --  1.3* 1.5  PHOS 5.0*  --   --   --   --  6.1* 8.8*  < > = values in this interval not displayed. Liver Function Tests:  Recent Labs Lab 04/04/14 1947 April 10, 2014 0501  AST 3293* 2853*  ALT 1350* 1275*  ALKPHOS 130* 140*  BILITOT 2.3* 2.5*  PROT 3.7* 3.9*  ALBUMIN 1.7* 1.7*   No results for input(s): LIPASE, AMYLASE in the last 168  hours. No results for input(s): AMMONIA in the last 168 hours. CBC:  Recent Labs Lab 04/03/14 0429 04/03/14 1407 04/04/14 0505 04/04/14 1947 04-10-2014 0501  WBC 47.8* 43.9* 45.4* 35.5* 35.5*  NEUTROABS  --  42.2*  --   --  32.7*  HGB 10.4* 10.3* 10.7* 10.3* 10.3*  HCT 34.9* 32.5* 33.4* 31.9* 32.9*  MCV 88.1 84.9 82.3 83.1 86.8  PLT 140* 119* 136* 122* 139*   Cardiac Enzymes:  Recent Labs Lab 04/02/14 1145 04/02/14 1820 04/03/14 0036 04/04/14 0505 04/04/14 1947  TROPONINI <0.30 0.60* 0.72* 0.40* 0.38*   BNP: BNP (last 3 results)  Recent Labs  04/02/14 1145 04/04/14 0505 04-10-14 0501  PROBNP 25794.0* 49144.0* 35923.0*   CBG: No results for input(s): GLUCAP in the last 168 hours.     Signed:  Gisell Buehrle Jerilynn Mages  [facility] Pager: [phone] 04/10/14, 11:48 AM

## 2014-04-07 NOTE — Progress Notes (Signed)
Patient status immediately changed around 1915 after shift change.  Patient presented very agitated with a heart rate in the 180's.  E-link and Dr. Cindie Laroche were called to assist.  Several new orders placed and labs were drawn.  Patient after several interventions (see MAR) is know calm and resting with eyes closed.   The family was contacted her husband Ardyth Gal, granddaughter Denyse Amass and he requested that his niece Jacqlyn Larsen be called as well Denyse Amass stated she would contact her).

## 2014-04-07 NOTE — Progress Notes (Signed)
CRITICAL VALUE ALERT  Critical value received:  Ca 5.3, INR >10, PTT 138  Date of notification:  04/04/14  Time of notification:  Final result @ 2358  Critical value read back: yes  Nurse who received alert:  Candiss Norse RN  MD notified (1st page):  Dr. Frances Furbish  Time of first page:  0010  MD notified (2nd page): Dr. Frances Furbish  Time of second page:  0020 and 0049\   No response at this time; Called E-Link

## 2014-04-07 NOTE — Progress Notes (Signed)
Talk to Dr. Cindie Laroche about patients status and that the family has been contacted and updated on her declining status.

## 2014-04-07 NOTE — Progress Notes (Signed)
Patient passed away, with family at bedside at 1040. Confirmed by second nurse, Penni Homans RN. Ventilator turned off, and patient extubated.

## 2014-04-07 NOTE — Progress Notes (Signed)
eLink Physician-Brief Progress Note Patient Name: MASSIEL STIPP DOB: March 23, 1931 MRN: [id number]   Date of Service  2014/04/11  HPI/Events of Note  Labs reviewed: MODS, poor prognosis  eICU Interventions  Increase rate vent to correct acidosis as it relates to maxed pressors supp calcium     Intervention Category Major Interventions: Acid-Base disturbance - evaluation and management  FEINSTEIN,DANIEL J. 2014/04/11, 12:56 AM

## 2014-04-07 NOTE — Progress Notes (Signed)
Patient's family is on the way to hospital, to be at patient's bedside when she is extubated. Patient appears comfortable at this time. Emotional support given to family.

## 2014-04-07 NOTE — Progress Notes (Signed)
The granddaughter Lynnea Ferrier. Asked to be called if the decision is to extubate and make comfort care.

## 2014-04-07 NOTE — Progress Notes (Signed)
Dr Cindie Laroche notified by phone of patient's death.

## 2014-04-07 NOTE — Progress Notes (Signed)
Subjective: She had a very difficult night last night with hypotension that did not respond well to pressors. This morning she is clearly much worse with blood pressure in the 60s, elevated heart rate, reduced urine output, markedly increased difficulty breathing and cold cyanotic feet.  Objective: Vital signs in last 24 hours: Temp:  [97 F (36.1 C)-98.1 F (36.7 C)] 98.1 F (36.7 C) (11/29 0500) Pulse Rate:  [27-142] 61 (11/29 0806) Resp:  [23-31] 30 (11/29 0806) BP: (46-116)/(17-80) 68/32 mmHg (11/29 0645) SpO2:  [72 %-97 %] 95 % (11/29 0806) FiO2 (%):  [50 %-55 %] 50 % (11/29 0806) Weight:  [81.3 kg (179 lb 3.7 oz)] 81.3 kg (179 lb 3.7 oz) (11/29 0500) Weight change: 6.7 kg (14 lb 12.3 oz) Last BM Date: 04/03/14  Intake/Output from previous day: 11/28 0701 - 11/29 0700 In: 6088 [I.V.:3825.3; NG/GT:150; IV Piggyback:1610; TPN:502.7] Out: 625 [Urine:525; Emesis/NG output:100]  PHYSICAL EXAM General appearance: Intubated on sedation hypotensive and dyspneic Resp: rales bilaterally and rhonchi bilaterally Cardio: Her heart rate is much faster with rate of about 120 to 130 and some irregularity GI: Absent bowel sounds Extremities: Her feet are cold and cyanotic but she has some mottling  Lab Results:  Results for orders placed or performed during the hospital encounter of 04/04/2014 (from the past 48 hour(s))  CBC with Differential     Status: Abnormal   Collection Time: 04/03/14  2:07 PM  Result Value Ref Range   WBC 43.9 (H) 4.0 - 10.5 K/uL   RBC 3.83 (L) 3.87 - 5.11 MIL/uL   Hemoglobin 10.3 (L) 12.0 - 15.0 g/dL   HCT 32.5 (L) 36.0 - 46.0 %   MCV 84.9 78.0 - 100.0 fL   MCH 26.9 26.0 - 34.0 pg   MCHC 31.7 30.0 - 36.0 g/dL   RDW 17.0 (H) 11.5 - 15.5 %   Platelets 119 (L) 150 - 400 K/uL   Neutrophils Relative % 96 (H) 43 - 77 %   Neutro Abs 42.2 (H) 1.7 - 7.7 K/uL   Lymphocytes Relative 1 (L) 12 - 46 %   Lymphs Abs 0.6 (L) 0.7 - 4.0 K/uL   Monocytes Relative 2 (L) 3 - 12  %   Monocytes Absolute 1.0 0.1 - 1.0 K/uL   Eosinophils Relative 0 0 - 5 %   Eosinophils Absolute 0.0 0.0 - 0.7 K/uL   Basophils Relative 0 0 - 1 %   Basophils Absolute 0.0 0.0 - 0.1 K/uL   Smear Review LARGE PLATELETS PRESENT     Comment: SPECIMEN CHECKED FOR CLOTS PLATELETS APPEAR DECREASED PLATELET COUNT CONFIRMED BY SMEAR   Heparin level (unfractionated)     Status: Abnormal   Collection Time: 04/03/14  2:07 PM  Result Value Ref Range   Heparin Unfractionated 0.19 (L) 0.30 - 0.70 IU/mL    Comment:        IF HEPARIN RESULTS ARE BELOW EXPECTED VALUES, AND PATIENT DOSAGE HAS BEEN CONFIRMED, SUGGEST FOLLOW UP TESTING OF ANTITHROMBIN III LEVELS.   Heparin level (unfractionated)     Status: Abnormal   Collection Time: 04/03/14 10:00 PM  Result Value Ref Range   Heparin Unfractionated 0.18 (L) 0.30 - 0.70 IU/mL    Comment:        IF HEPARIN RESULTS ARE BELOW EXPECTED VALUES, AND PATIENT DOSAGE HAS BEEN CONFIRMED, SUGGEST FOLLOW UP TESTING OF ANTITHROMBIN III LEVELS.   CBC     Status: Abnormal   Collection Time: 04/04/14  5:05 AM  Result Value  Ref Range   WBC 45.4 (H) 4.0 - 10.5 K/uL   RBC 4.06 3.87 - 5.11 MIL/uL   Hemoglobin 10.7 (L) 12.0 - 15.0 g/dL   HCT 33.4 (L) 36.0 - 46.0 %   MCV 82.3 78.0 - 100.0 fL   MCH 26.4 26.0 - 34.0 pg   MCHC 32.0 30.0 - 36.0 g/dL   RDW 17.0 (H) 11.5 - 15.5 %   Platelets 136 (L) 150 - 400 K/uL  Heparin level (unfractionated)     Status: Abnormal   Collection Time: 04/04/14  5:05 AM  Result Value Ref Range   Heparin Unfractionated 0.10 (L) 0.30 - 0.70 IU/mL    Comment:        IF HEPARIN RESULTS ARE BELOW EXPECTED VALUES, AND PATIENT DOSAGE HAS BEEN CONFIRMED, SUGGEST FOLLOW UP TESTING OF ANTITHROMBIN III LEVELS.   Basic metabolic panel     Status: Abnormal   Collection Time: 04/04/14  5:05 AM  Result Value Ref Range   Sodium 136 (L) 137 - 147 mEq/L   Potassium 5.3 3.7 - 5.3 mEq/L   Chloride 93 (L) 96 - 112 mEq/L   CO2 16 (L) 19 -  32 mEq/L   Glucose, Bld 79 70 - 99 mg/dL   BUN 17 6 - 23 mg/dL   Creatinine, Ser 2.61 (H) 0.50 - 1.10 mg/dL    Comment: DELTA CHECK NOTED   Calcium 6.1 (LL) 8.4 - 10.5 mg/dL    Comment: CRITICAL RESULT CALLED TO, READ BACK BY AND VERIFIED WITH: DANIELS,J AT 6:35AM ON 04/04/14 BY FESTERMAN,C    GFR calc non Af Amer 16 (L) >90 mL/min   GFR calc Af Amer 19 (L) >90 mL/min    Comment: (NOTE) The eGFR has been calculated using the CKD EPI equation. This calculation has not been validated in all clinical situations. eGFR's persistently <90 mL/min signify possible Chronic Kidney Disease.   Procalcitonin     Status: None   Collection Time: 04/04/14  5:05 AM  Result Value Ref Range   Procalcitonin 9.36 ng/mL    Comment:        Interpretation: PCT > 2 ng/mL: Systemic infection (sepsis) is likely, unless other causes are known. (NOTE)         ICU PCT Algorithm               Non ICU PCT Algorithm    ----------------------------     ------------------------------         PCT < 0.25 ng/mL                 PCT < 0.1 ng/mL     Stopping of antibiotics            Stopping of antibiotics       strongly encouraged.               strongly encouraged.    ----------------------------     ------------------------------       PCT level decrease by               PCT < 0.25 ng/mL       >= 80% from peak PCT       OR PCT 0.25 - 0.5 ng/mL          Stopping of antibiotics  encouraged.     Stopping of antibiotics           encouraged.    ----------------------------     ------------------------------       PCT level decrease by              PCT >= 0.25 ng/mL       < 80% from peak PCT        AND PCT >= 0.5 ng/mL            Continuing antibiotics                                               encouraged.       Continuing antibiotics            encouraged.    ----------------------------     ------------------------------     PCT level increase compared           PCT > 0.5 ng/mL         with peak PCT AND          PCT >= 0.5 ng/mL             Escalation of antibiotics                                          strongly encouraged.      Escalation of antibiotics        strongly encouraged.   Troponin I     Status: Abnormal   Collection Time: 04/04/14  5:05 AM  Result Value Ref Range   Troponin I 0.40 (HH) <0.30 ng/mL    Comment: CRITICAL RESULT CALLED TO, READ BACK BY AND VERIFIED WITH: DANIELS,J AT 6:35AM ON 04/04/14 BY FESTERMAN,C        Due to the release kinetics of cTnI, a negative result within the first hours of the onset of symptoms does not rule out myocardial infarction with certainty. If myocardial infarction is still suspected, repeat the test at appropriate intervals.   Lactic acid, plasma     Status: Abnormal   Collection Time: 04/04/14  5:05 AM  Result Value Ref Range   Lactic Acid, Venous 11.2 (H) 0.5 - 2.2 mmol/L  Pro b natriuretic peptide     Status: Abnormal   Collection Time: 04/04/14  5:05 AM  Result Value Ref Range   Pro B Natriuretic peptide (BNP) 49144.0 (H) 0 - 450 pg/mL  APTT     Status: Abnormal   Collection Time: 04/04/14  5:05 AM  Result Value Ref Range   aPTT 133 (H) 24 - 37 seconds    Comment:        IF BASELINE aPTT IS ELEVATED, SUGGEST PATIENT RISK ASSESSMENT BE USED TO DETERMINE APPROPRIATE ANTICOAGULANT THERAPY.   APTT     Status: Abnormal   Collection Time: 04/04/14  1:03 PM  Result Value Ref Range   aPTT 160 (H) 24 - 37 seconds    Comment:        IF BASELINE aPTT IS ELEVATED, SUGGEST PATIENT RISK ASSESSMENT BE USED TO DETERMINE APPROPRIATE ANTICOAGULANT THERAPY.   APTT     Status: Abnormal   Collection Time: 04/04/14  4:35 PM  Result Value Ref Range   aPTT 163 (  H) 24 - 37 seconds    Comment:        IF BASELINE aPTT IS ELEVATED, SUGGEST PATIENT RISK ASSESSMENT BE USED TO DETERMINE APPROPRIATE ANTICOAGULANT THERAPY.   Basic metabolic panel     Status: Abnormal   Collection Time:  04/04/14  7:47 PM  Result Value Ref Range   Sodium 133 (L) 137 - 147 mEq/L   Potassium 5.3 3.7 - 5.3 mEq/L   Chloride 90 (L) 96 - 112 mEq/L   CO2 17 (L) 19 - 32 mEq/L   Glucose, Bld 138 (H) 70 - 99 mg/dL   BUN 22 6 - 23 mg/dL   Creatinine, Ser 5.50 (H) 0.50 - 1.10 mg/dL   Calcium 5.3 (LL) 8.4 - 10.5 mg/dL    Comment: CRITICAL RESULT CALLED TO, READ BACK BY AND VERIFIED WITH: T.NEILSON AT 2047 ON 04/04/14 BY S.VANHOORNE    GFR calc non Af Amer 13 (L) >90 mL/min   GFR calc Af Amer 15 (L) >90 mL/min    Comment: (NOTE) The eGFR has been calculated using the CKD EPI equation. This calculation has not been validated in all clinical situations. eGFR's persistently <90 mL/min signify possible Chronic Kidney Disease.    Anion gap 26 (H) 5 - 15  Lactic acid, plasma     Status: Abnormal   Collection Time: 04/04/14  7:47 PM  Result Value Ref Range   Lactic Acid, Venous 12.8 (H) 0.5 - 2.2 mmol/L  CBC     Status: Abnormal   Collection Time: 04/04/14  7:47 PM  Result Value Ref Range   WBC 35.5 (H) 4.0 - 10.5 K/uL   RBC 3.84 (L) 3.87 - 5.11 MIL/uL   Hemoglobin 10.3 (L) 12.0 - 15.0 g/dL   HCT 15.8 (L) 68.2 - 57.4 %   MCV 83.1 78.0 - 100.0 fL   MCH 26.8 26.0 - 34.0 pg   MCHC 32.3 30.0 - 36.0 g/dL   RDW 93.5 (H) 52.1 - 74.7 %   Platelets 122 (L) 150 - 400 K/uL    Comment: SPECIMEN CHECKED FOR CLOTS  Troponin I     Status: Abnormal   Collection Time: 04/04/14  7:47 PM  Result Value Ref Range   Troponin I 0.38 (HH) <0.30 ng/mL    Comment:        Due to the release kinetics of cTnI, a negative result within the first hours of the onset of symptoms does not rule out myocardial infarction with certainty. If myocardial infarction is still suspected, repeat the test at appropriate intervals. CRITICAL VALUE NOTED.  VALUE IS CONSISTENT WITH PREVIOUSLY REPORTED AND CALLED VALUE.   Magnesium     Status: Abnormal   Collection Time: 04/04/14  7:47 PM  Result Value Ref Range   Magnesium 1.3 (L)  1.5 - 2.5 mg/dL  Phosphorus     Status: Abnormal   Collection Time: 04/04/14  7:47 PM  Result Value Ref Range   Phosphorus 6.1 (H) 2.3 - 4.6 mg/dL  Hepatic function panel     Status: Abnormal   Collection Time: 04/04/14  7:47 PM  Result Value Ref Range   Total Protein 3.7 (L) 6.0 - 8.3 g/dL   Albumin 1.7 (L) 3.5 - 5.2 g/dL   AST 1595 (H) 0 - 37 U/L   ALT 1350 (H) 0 - 35 U/L   Alkaline Phosphatase 130 (H) 39 - 117 U/L   Total Bilirubin 2.3 (H) 0.3 - 1.2 mg/dL   Bilirubin, Direct 1.6 (H) 0.0 -  0.3 mg/dL   Indirect Bilirubin 0.7 0.3 - 0.9 mg/dL  Protime-INR     Status: Abnormal   Collection Time: 04/04/14  7:47 PM  Result Value Ref Range   Prothrombin Time >90.0 (H) 11.6 - 15.2 seconds   INR >10.00 (HH) 0.00 - 1.49    Comment: RESULT REPEATED AND VERIFIED CRITICAL RESULT CALLED TO, READ BACK BY AND VERIFIED WITH:  WANGER,R @ 2104 ON 04/04/14 BY [place] Planas   APTT     Status: Abnormal   Collection Time: 04/04/14  8:59 PM  Result Value Ref Range   aPTT 140 (H) 24 - 37 seconds    Comment:        IF BASELINE aPTT IS ELEVATED, SUGGEST PATIENT RISK ASSESSMENT BE USED TO DETERMINE APPROPRIATE ANTICOAGULANT THERAPY.   APTT     Status: Abnormal   Collection Time: 04/04/14 10:15 PM  Result Value Ref Range   aPTT 138 (H) 24 - 37 seconds    Comment:        IF BASELINE aPTT IS ELEVATED, SUGGEST PATIENT RISK ASSESSMENT BE USED TO DETERMINE APPROPRIATE ANTICOAGULANT THERAPY.   APTT     Status: Abnormal   Collection Time: 04-19-14  2:04 AM  Result Value Ref Range   aPTT 135 (H) 24 - 37 seconds    Comment:        IF BASELINE aPTT IS ELEVATED, SUGGEST PATIENT RISK ASSESSMENT BE USED TO DETERMINE APPROPRIATE ANTICOAGULANT THERAPY.   CBC     Status: Abnormal   Collection Time: 04-19-2014  5:01 AM  Result Value Ref Range   WBC 35.5 (H) 4.0 - 10.5 K/uL    Comment: WHITE COUNT CONFIRMED ON SMEAR   RBC 3.79 (L) 3.87 - 5.11 MIL/uL   Hemoglobin 10.3 (L) 12.0 - 15.0 g/dL   HCT 32.9 (L)  36.0 - 46.0 %   MCV 86.8 78.0 - 100.0 fL   MCH 27.2 26.0 - 34.0 pg   MCHC 31.3 30.0 - 36.0 g/dL   RDW 17.7 (H) 11.5 - 15.5 %   Platelets 139 (L) 150 - 400 K/uL    Comment: SPECIMEN CHECKED FOR CLOTS PLATELET COUNT CONFIRMED BY SMEAR   Comprehensive metabolic panel     Status: Abnormal   Collection Time: Apr 19, 2014  5:01 AM  Result Value Ref Range   Sodium 132 (L) 137 - 147 mEq/L   Potassium 6.4 (H) 3.7 - 5.3 mEq/L    Comment: DELTA CHECK NOTED   Chloride 87 (L) 96 - 112 mEq/L   CO2 10 (LL) 19 - 32 mEq/L    Comment: CRITICAL RESULT CALLED TO, READ BACK BY AND VERIFIED WITH: KEITH,A AT 6:30AM ON 04-19-2014 BY FESTERMAN,C    Glucose, Bld 119 (H) 70 - 99 mg/dL   BUN 22 6 - 23 mg/dL   Creatinine, Ser 3.47 (H) 0.50 - 1.10 mg/dL   Calcium 5.3 (LL) 8.4 - 10.5 mg/dL    Comment: CRITICAL RESULT CALLED TO, READ BACK BY AND VERIFIED WITH: KEITH,A AT 6:30AM ON 19-Apr-2014 BY FESTERMAN,C    Total Protein 3.9 (L) 6.0 - 8.3 g/dL   Albumin 1.7 (L) 3.5 - 5.2 g/dL   AST 2853 (H) 0 - 37 U/L   ALT 1275 (H) 0 - 35 U/L   Alkaline Phosphatase 140 (H) 39 - 117 U/L   Total Bilirubin 2.5 (H) 0.3 - 1.2 mg/dL   GFR calc non Af Amer 11 (L) >90 mL/min   GFR calc Af Amer 13 (L) >90 mL/min  Comment: (NOTE) The eGFR has been calculated using the CKD EPI equation. This calculation has not been validated in all clinical situations. eGFR's persistently <90 mL/min signify possible Chronic Kidney Disease.   Magnesium     Status: None   Collection Time: 04/12/2014  5:01 AM  Result Value Ref Range   Magnesium 1.5 1.5 - 2.5 mg/dL  Phosphorus     Status: Abnormal   Collection Time: April 12, 2014  5:01 AM  Result Value Ref Range   Phosphorus 8.8 (H) 2.3 - 4.6 mg/dL  Triglycerides     Status: Abnormal   Collection Time: 04-12-2014  5:01 AM  Result Value Ref Range   Triglycerides 338 (H) <150 mg/dL  Differential     Status: Abnormal   Collection Time: 04-12-14  5:01 AM  Result Value Ref Range   Neutrophils Relative % 73  43 - 77 %   Lymphocytes Relative 6 (L) 12 - 46 %   Monocytes Relative 2 (L) 3 - 12 %   Eosinophils Relative 0 0 - 5 %   Basophils Relative 0 0 - 1 %   Band Neutrophils 16 (H) 0 - 10 %   Metamyelocytes Relative 0 %   Myelocytes 1 %   Promyelocytes Absolute 2 %   Blasts 0 %   nRBC 0 0 /100 WBC   Neutro Abs 32.7 (H) 1.7 - 7.7 K/uL   Lymphs Abs 2.1 0.7 - 4.0 K/uL   Monocytes Absolute 0.7 0.1 - 1.0 K/uL   Eosinophils Absolute 0.0 0.0 - 0.7 K/uL   Basophils Absolute 0.0 0.0 - 0.1 K/uL   RBC Morphology SCHISTOCYTES PRESENT (2-5/hpf)     Comment: POLYCHROMASIA PRESENT BURR CELLS    WBC Morphology MILD LEFT SHIFT (1-5% METAS, OCC MYELO, OCC BANDS)     Comment: ATYPICAL LYMPHOCYTES TOXIC GRANULATION VACUOLATED NEUTROPHILS   APTT     Status: Abnormal   Collection Time: 04-12-14  5:01 AM  Result Value Ref Range   aPTT 142 (H) 24 - 37 seconds    Comment:        IF BASELINE aPTT IS ELEVATED, SUGGEST PATIENT RISK ASSESSMENT BE USED TO DETERMINE APPROPRIATE ANTICOAGULANT THERAPY.   Pro b natriuretic peptide     Status: Abnormal   Collection Time: 04-12-14  5:01 AM  Result Value Ref Range   Pro B Natriuretic peptide (BNP) 35923.0 (H) 0 - 450 pg/mL    ABGS  Recent Labs  04/03/14 0527  PHART 7.172*  PO2ART 89.4  TCO2 9.0  HCO3 9.2*   CULTURES Recent Results (from the past 240 hour(s))  Clostridium Difficile by PCR     Status: Abnormal   Collection Time: 04/04/2014 10:22 PM  Result Value Ref Range Status   C difficile by pcr POSITIVE (A) NEGATIVE Final    Comment: CRITICAL RESULT CALLED TO, READ BACK BY AND VERIFIED WITH:  HAMILTON,S @ 3762 ON 03/14/2014 BY WOODIE,J   Clostridium Difficile by PCR     Status: Abnormal   Collection Time: 04/01/14  1:00 PM  Result Value Ref Range Status   C difficile by pcr POSITIVE (A) NEGATIVE Final    Comment: CRITICAL RESULT CALLED TO, READ BACK BY AND VERIFIED WITH: SEIGLA,J. AT 1703 ON 04/01/2014 BY BAUGHAM,M.   Culture, blood (routine x  2)     Status: None   Collection Time: 04/02/14  6:20 PM  Result Value Ref Range Status   Specimen Description BLOOD LEFT ARM  Final   Special Requests BOTTLES DRAWN AEROBIC  ONLY 4CC  Final   Culture  Setup Time   Final    04/03/2014 17:10 Performed at [facility]    Culture   Final    STAPHYLOCOCCUS SPECIES (COAGULASE NEGATIVE) Note: THE SIGNIFICANCE OF ISOLATING THIS ORGANISM FROM A SINGLE VENIPUNCTURE CANNOT BE PREDICTED WITHOUT FURTHER CLINICAL AND CULTURE CORRELATION. SUSCEPTIBILITIES AVAILABLE ONLY ON REQUEST. Note: Gram Stain Report Called to,Read Back By and Verified With: SPANGLER E AT 3825 BY HUFFINES S ON 04/03/2014 Performed at [facility] Performed at [facility]    Report Status 04/04/2014 FINAL  Final  Culture, blood (routine x 2)     Status: None (Preliminary result)   Collection Time: 04/02/14  6:25 PM  Result Value Ref Range Status   Specimen Description PORTA CATH  Final   Special Requests BOTTLES DRAWN AEROBIC AND ANAEROBIC [facility]  Final   Culture NO GROWTH 3 DAYS  Final   Report Status PENDING  Incomplete   Studies/Results: Dg Chest Port 1 View  04/03/2014   CLINICAL DATA:  Orogastric tube placement .  EXAM: PORTABLE CHEST - 1 VIEW  COMPARISON:  04/03/2014 .  FINDINGS: Endotracheal tube and OG tube in good anatomic position. Cardiomegaly with bilateral pulmonary venous congestion and bilateral pulmonary edema with bilateral pleural effusions. Mild improvement pulmonary edema. These findings consistent congestive heart failure. No pneumothorax.  IMPRESSION: 1. Endotracheal tube and orogastric tube in good anatomic position. 2. Congestive heart failure with bilateral pulmonary edema and bilateral effusions. Interim slight improvement of pulmonary edema.   Electronically Signed   By: Marcello Moores  Register   On: 04/03/2014 17:14    Medications:  Prior to Admission:  Prescriptions prior to admission  Medication Sig Dispense Refill Last Dose  .  amLODipine (NORVASC) 5 MG tablet Take 5 mg by mouth daily.   03/16/2014  . apixaban (ELIQUIS) 5 MG TABS tablet Take 1 tablet (5 mg total) by mouth 2 (two) times daily. 60 tablet 3 03/09/2014 at 11:30  . capsaicin (ZOSTRIX) 0.025 % cream Apply topically 2 (two) times daily. (Patient taking differently: Apply 1 application topically 2 (two) times daily as needed. ) 60 g 0 Past Month  . HYDROcodone-acetaminophen (NORCO/VICODIN) 5-325 MG per tablet Take 1 tablet by mouth every 5 (five) hours as needed (pain).   04/03/2014  . levothyroxine (SYNTHROID, LEVOTHROID) 100 MCG tablet Take 100 mcg by mouth daily. Do not take synthroid on Mon and Thursdays   03/19/2014  . lisinopril (PRINIVIL,ZESTRIL) 10 MG tablet Take 1 tablet (10 mg total) by mouth daily. 30 tablet 3 03/24/2014  . metoprolol succinate (TOPROL-XL) 25 MG 24 hr tablet Take 12.5 mg by mouth daily.   03/27/2014 at 1130  . ondansetron (ZOFRAN) 4 MG tablet Take 4 mg by mouth every 8 (eight) hours as needed for nausea or vomiting.   03/29/2014  . pregabalin (LYRICA) 50 MG capsule Take 50 mg by mouth 3 (three) times daily as needed (Pain).    03/27/2014  . Probiotic Product (ALIGN PO) Take 1 tablet by mouth daily.   Past Week  . ranitidine (ZANTAC) 150 MG capsule Take 150 mg by mouth 2 (two) times daily.   03/23/2014  . Red Yeast Rice Extract (RED YEAST RICE PO) Take 1 capsule by mouth daily.   03/27/2014   Scheduled: . antiseptic oral rinse  7 mL Mouth Rinse QID  . calcium gluconate  2 g Intravenous Once  . chlorhexidine  15 mL Mouth Rinse BID  . ciprofloxacin  400 mg  Intravenous Q24H  . citalopram  20 mg Oral Daily  . ferrous sulfate  325 mg Oral Q breakfast  . hydrocortisone sod succinate (SOLU-CORTEF) inj  100 mg Intravenous Q6H  . levothyroxine  50 mcg Intravenous Once per day on Sun Tue Wed Fri Sat  . metronidazole  500 mg Intravenous Q8H  . pantoprazole (PROTONIX) IV  40 mg Intravenous Q24H  . saccharomyces boulardii  250 mg Oral BID  .  sodium chloride  10-40 mL Intracatheter Q12H  . sodium chloride  10-40 mL Intracatheter Q12H  . vancomycin  250 mg Oral 4 times per day  . vancomycin  1,000 mg Intravenous Q48H   Continuous: . dextrose    . Marland KitchenTPN (CLINIMIX-E) Adult 30 mL/hr at 04/04/14 1800   And  . fat emulsion 250 mL (04/04/14 1726)  . fentaNYL infusion INTRAVENOUS 125 mcg/hr (May 03, 2014 0123)  . LORazepam (ATIVAN) infusion    . morphine    . norepinephrine (LEVOPHED) Adult infusion 40 mcg/min (May 03, 2014 0122)  .  sodium bicarbonate  infusion 1000 mL     HTX:HFSFSELTR, fentaNYL, fentaNYL, LORazepam, LORazepam, menthol-cetylpyridinium, metoprolol, morphine injection, ondansetron **OR** ondansetron (ZOFRAN) IV, promethazine, sodium chloride, sodium chloride  Assesment: She was admitted with diverticulitis and perforation of the colon. She had cardiopulmonary arrest and has been septic. She originally did a little better and was improving somewhat but had a very difficult night last night. She has congestive heart failure and has not made much urine. She has acute renal failure. I do not think she is going to survive. Active Problems:   Hypertension   Thyroid disease   Diverticulitis of colon   Atrial fibrillation   Weakness generalized   Protein-calorie malnutrition, severe   CHF (congestive heart failure)   Diverticulosis   Gastroenteritis   Diverticulitis   Diverticulitis of colon with perforation   Demand ischemia    Plan: I discussed with her husband and niece at bedside. They would like to remove her from life support and transition to comfort care. I have written order for terminal weaning process to begin I discussed this with Dr. Lorriane Shire and he agrees    LOS: 8 days   Ellise Kovack L 05-03-2014, 9:08 AM

## 2014-04-07 NOTE — Progress Notes (Signed)
CRITICAL VALUE ALERT  Critical value received:  CO2 10, Ca 5.3  Date of notification:  2014-04-24  Time of notification:  0640  Critical value read back: yes  Nurse who received alert:  Candiss Norse RN  MD notified (1st page):  Dr. Cindie Laroche  Time of first page:  0645  MD notified (2nd page):  Time of second page:  Responding MD:  Dr. Cindie Laroche  Time MD responded:  [phone]

## 2014-04-07 DEATH — deceased

## 2014-04-09 LAB — HEPARIN INDUCED THROMBOCYTOPENIA PNL
Heparin Induced Plt Ab: NEGATIVE
Patient O.D.: 0.025
UFH HIGH DOSE UFH H: 41 %
UFH LOW DOSE 0.1 IU/ML: 29 %
UFH LOW DOSE 0.5 IU/ML: 38 %

## 2014-04-10 MED FILL — Medication: Qty: 1 | Status: AC

## 2014-04-17 ENCOUNTER — Encounter (HOSPITAL_COMMUNITY): Payer: Self-pay | Admitting: Physical Therapy

## 2016-01-06 IMAGING — CT CT ABD-PELV W/ CM
2 of 5 series · 16 of 46 positions shown, 18 images · IV contrast (Omnipaque 300)
Comparison: 09/13/2013 and prior CTs dating back to 06/17/2013

CLINICAL DATA: 83-year-old female with left-sided abdominal and
pelvic and nausea. History of lupus and thyroid cancer.

EXAM:
CT ABDOMEN AND PELVIS WITH CONTRAST
TECHNIQUE: Multidetector CT imaging of the abdomen and pelvis was performed
using the standard protocol following bolus administration of
intravenous contrast.
CONTRAST:  100mL OMNIPAQUE IOHEXOL 300 MG/ML  SOLN

[Series 2: abd_pel_with 5.0 b40f · axial · 0.71mm/px · z∈[-470,-70]mm · 13 of 91 slices shown, 15 images]
[im 6/91  soft-tissue]
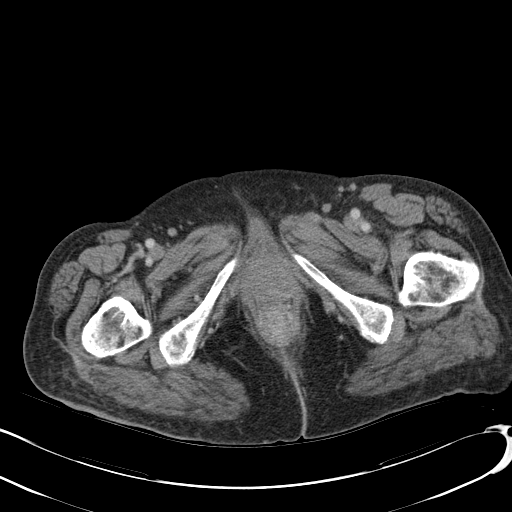
[im 6/91  bone]
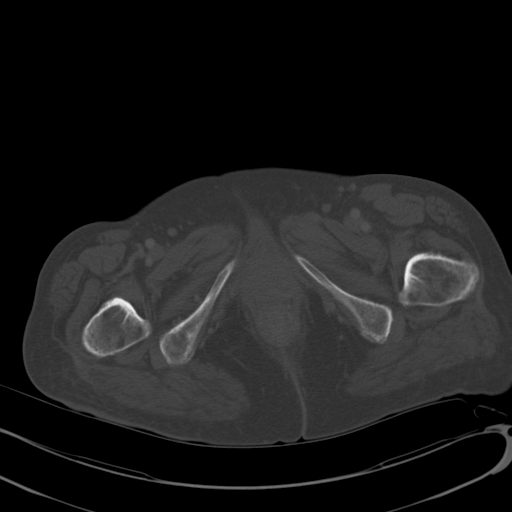
[im 11/91  soft-tissue]
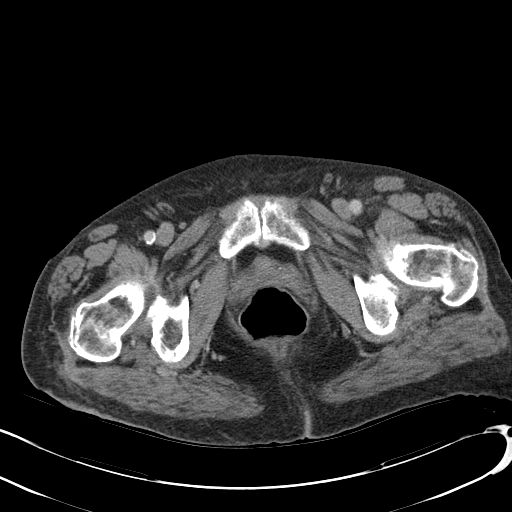
[im 21/91  soft-tissue]
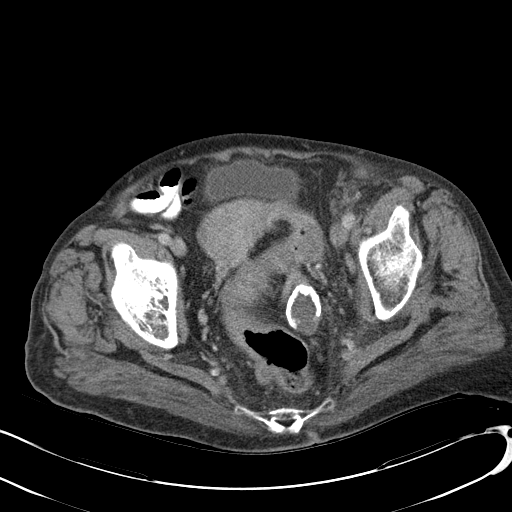
[im 26/91  soft-tissue]
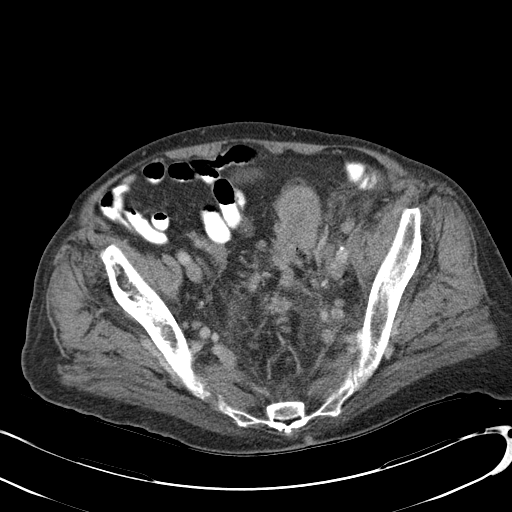
[im 31/91  soft-tissue]
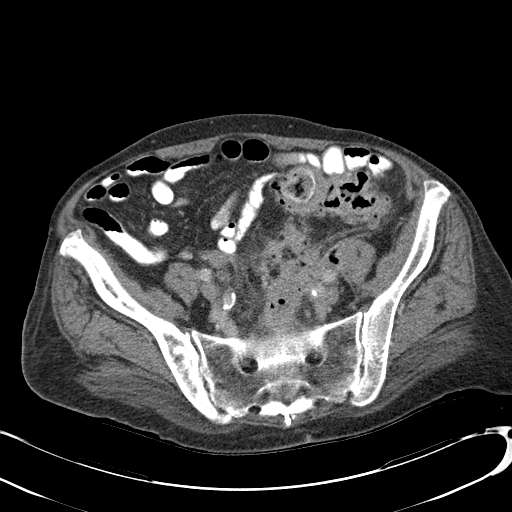
[im 41/91  soft-tissue]
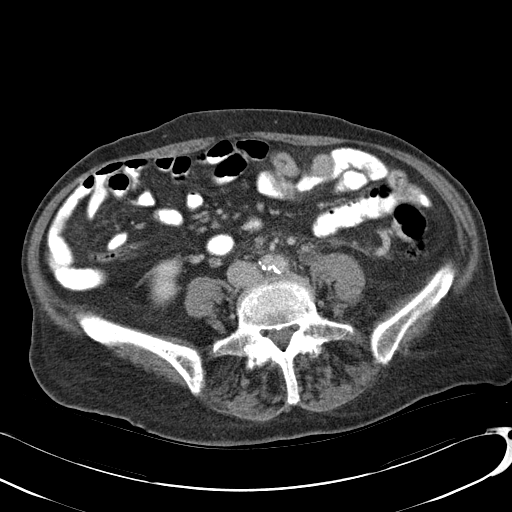
[im 46/91  soft-tissue]
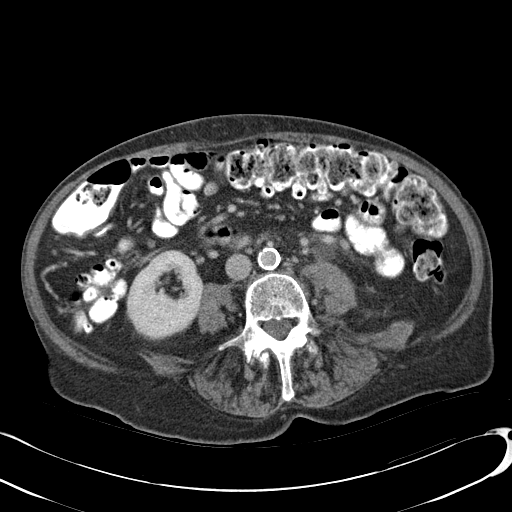
[im 51/91  soft-tissue]
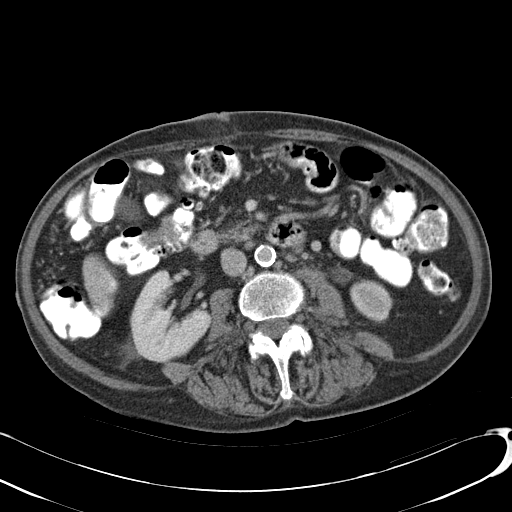
[im 61/91  soft-tissue]
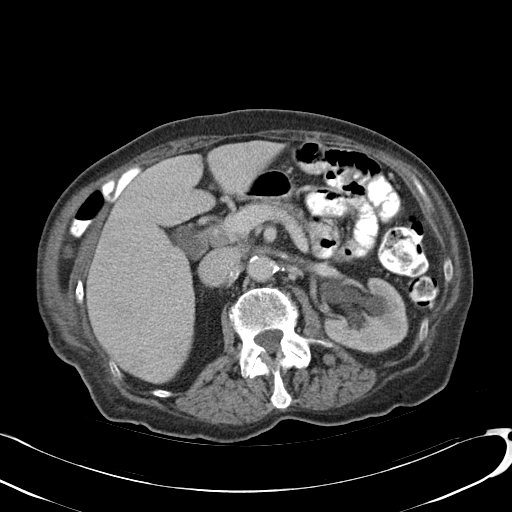
[im 61/91  bone]
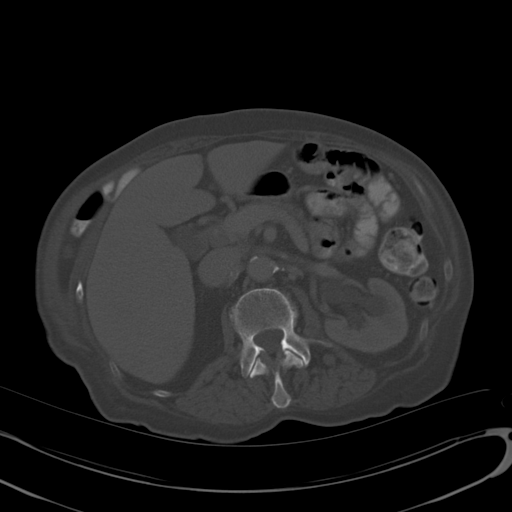
[im 66/91  soft-tissue]
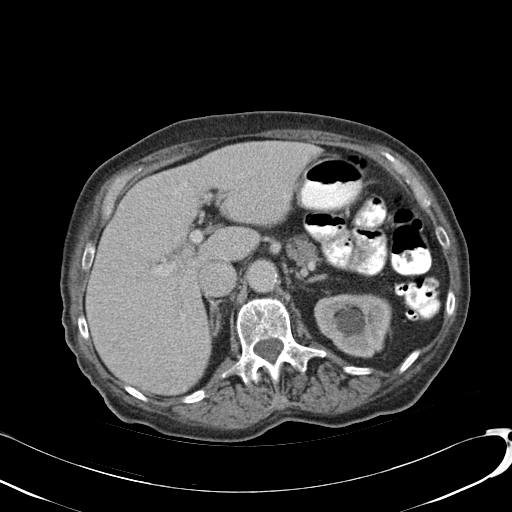
[im 71/91  soft-tissue]
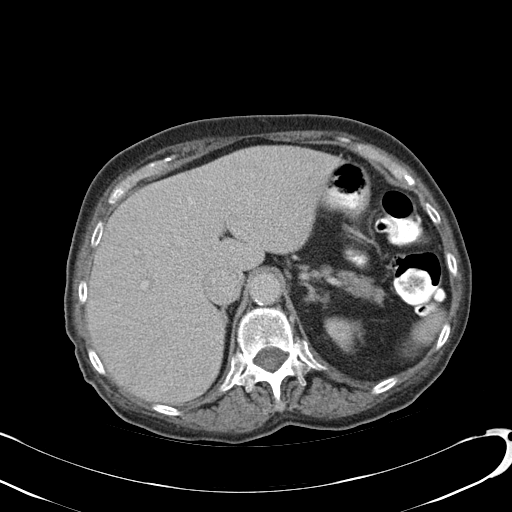
[im 81/91  soft-tissue]
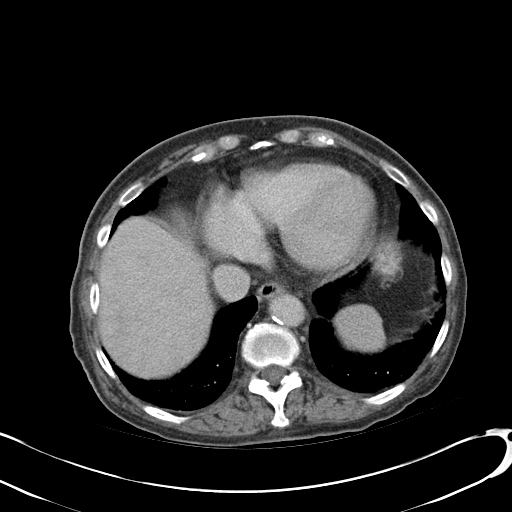
[im 86/91  soft-tissue]
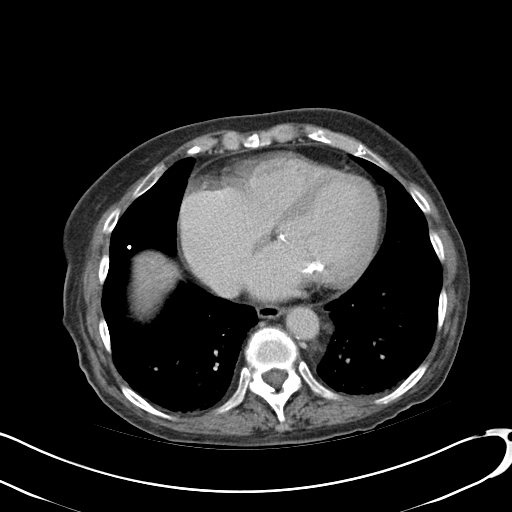

[Series 3: abd_pel_with 3.0 spo · coronal · 0.69mm/px · 3 of 88 slices shown]
[im 30/88  soft-tissue]
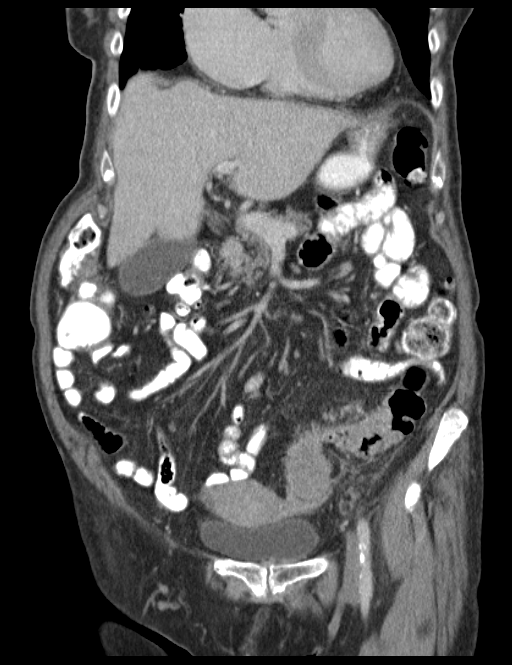
[im 39/88  soft-tissue]
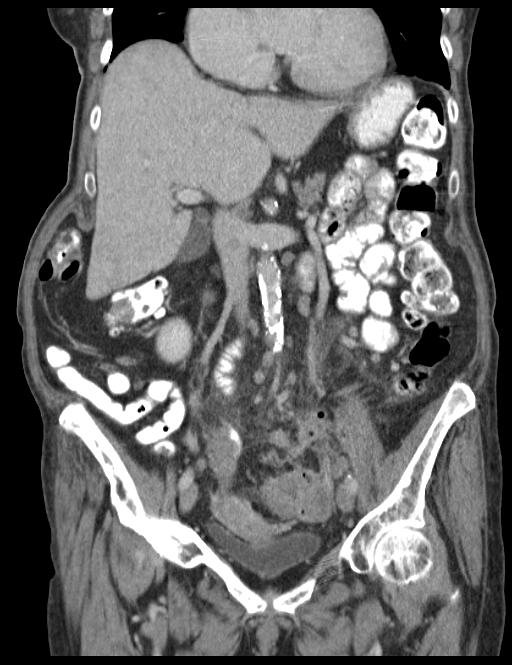
[im 49/88  soft-tissue]
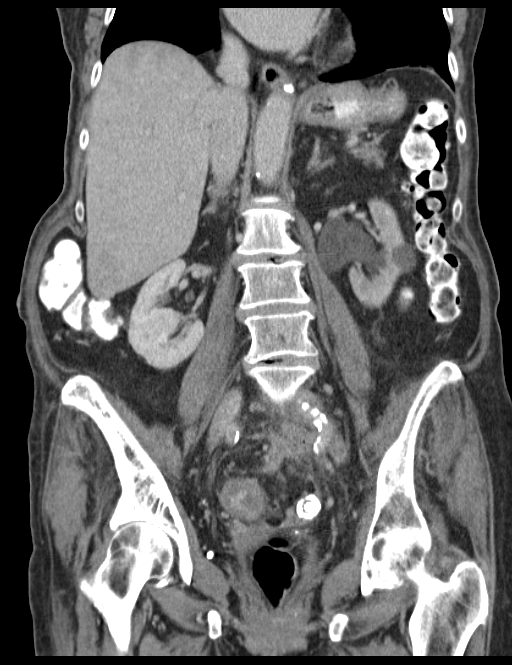

[16 of 46 positions shown; findings below may reference images not displayed]

FINDINGS: Cardiomegaly again identified.

Mildly enlarged lower thoracic periaortic lymph nodes are again
noted with the index node measuring 11 mm in short axis (image 2).

There is moderate to extensive inflammation within the left and
posterior pelvis with scattered foci of free air probably related to
diverticulitis. Circumferential wall thickening of the sigmoid colon
is noted which may be reactive.

Inflammation abuts anterior upper sacrum without evidence of
osteomyelitis. This inflammation and gas also a lies along the
medial aspect of the left psoas muscle.

No discrete focal abscess or small bowel obstruction identified.

There is new moderate left hydroureteronephrosis caused by the left
pelvic inflammation.

The liver, pancreas, adrenal glands, right kidney and gallbladder
are unremarkable.

Splenic scarring is now noted.

The bladder and uterus are unremarkable.

Diverticulosis of the remaining colon identified.

A stable small paraumbilical hernia containing fat again noted.

A stable rim calcified lesion within the lower left pelvis is noted.

No acute or suspicious bony abnormalities are identified.
IMPRESSION: Moderate to extensive inflammation and scattered free air within the
left and posterior pelvis likely related to rupture diverticulitis.
No evidence of discrete focal abscess or bowel obstruction.

New moderate left hydroureteronephrosis caused by left pelvic
inflammation.

Cardiomegaly, splenic scarring and small stable periumbilical hernia
containing fat.
